# Patient Record
Sex: Female | Born: 1935 | Race: White | Hispanic: No | Smoking: Never smoker
Health system: Southern US, Community
[De-identification: ages and names within clinical notes are randomized; demographics above are authoritative.]

## PROBLEM LIST (undated history)

## (undated) DIAGNOSIS — Z8639 Personal history of other endocrine, nutritional and metabolic disease: Secondary | ICD-10-CM

## (undated) DIAGNOSIS — R519 Headache, unspecified: Secondary | ICD-10-CM

## (undated) DIAGNOSIS — I209 Angina pectoris, unspecified: Secondary | ICD-10-CM

## (undated) DIAGNOSIS — R0602 Shortness of breath: Secondary | ICD-10-CM

## (undated) DIAGNOSIS — I10 Essential (primary) hypertension: Secondary | ICD-10-CM

## (undated) DIAGNOSIS — I272 Pulmonary hypertension, unspecified: Secondary | ICD-10-CM

## (undated) DIAGNOSIS — D649 Anemia, unspecified: Secondary | ICD-10-CM

## (undated) DIAGNOSIS — K5790 Diverticulosis of intestine, part unspecified, without perforation or abscess without bleeding: Secondary | ICD-10-CM

## (undated) DIAGNOSIS — I2510 Atherosclerotic heart disease of native coronary artery without angina pectoris: Secondary | ICD-10-CM

## (undated) DIAGNOSIS — G4733 Obstructive sleep apnea (adult) (pediatric): Secondary | ICD-10-CM

## (undated) DIAGNOSIS — C801 Malignant (primary) neoplasm, unspecified: Secondary | ICD-10-CM

## (undated) DIAGNOSIS — Z8669 Personal history of other diseases of the nervous system and sense organs: Secondary | ICD-10-CM

## (undated) DIAGNOSIS — I639 Cerebral infarction, unspecified: Secondary | ICD-10-CM

## (undated) DIAGNOSIS — K219 Gastro-esophageal reflux disease without esophagitis: Secondary | ICD-10-CM

## (undated) DIAGNOSIS — G629 Polyneuropathy, unspecified: Secondary | ICD-10-CM

## (undated) DIAGNOSIS — E785 Hyperlipidemia, unspecified: Secondary | ICD-10-CM

## (undated) DIAGNOSIS — R51 Headache: Secondary | ICD-10-CM

## (undated) HISTORY — PX: VAGINAL HYSTERECTOMY: SUR661

## (undated) HISTORY — PX: CARPAL TUNNEL RELEASE: SHX101

## (undated) HISTORY — PX: CHOLECYSTECTOMY: SHX55

## (undated) HISTORY — PX: LUMBAR LAMINECTOMY: SHX95

## (undated) HISTORY — PX: BLADDER SUSPENSION: SHX72

## (undated) HISTORY — DX: Cerebral infarction, unspecified: I63.9

## (undated) HISTORY — PX: TONSILLECTOMY: SUR1361

## (undated) HISTORY — PX: APPENDECTOMY: SHX54

---

## 2001-08-18 ENCOUNTER — Encounter: Admission: RE | Admit: 2001-08-18 | Discharge: 2001-08-18 | Payer: Self-pay | Admitting: Neurosurgery

## 2001-08-18 ENCOUNTER — Encounter: Payer: Self-pay | Admitting: Neurosurgery

## 2001-09-15 ENCOUNTER — Encounter: Admission: RE | Admit: 2001-09-15 | Discharge: 2001-09-15 | Payer: Self-pay | Admitting: Neurosurgery

## 2001-09-15 ENCOUNTER — Encounter: Payer: Self-pay | Admitting: Neurosurgery

## 2001-09-29 ENCOUNTER — Encounter: Payer: Self-pay | Admitting: Neurosurgery

## 2001-09-29 ENCOUNTER — Encounter: Admission: RE | Admit: 2001-09-29 | Discharge: 2001-09-29 | Payer: Self-pay | Admitting: Neurosurgery

## 2001-12-30 ENCOUNTER — Encounter: Payer: Self-pay | Admitting: Neurosurgery

## 2002-01-04 ENCOUNTER — Inpatient Hospital Stay (HOSPITAL_COMMUNITY): Admission: RE | Admit: 2002-01-04 | Discharge: 2002-01-06 | Payer: Self-pay | Admitting: Neurosurgery

## 2002-01-04 ENCOUNTER — Encounter: Payer: Self-pay | Admitting: Neurosurgery

## 2002-12-21 ENCOUNTER — Encounter: Payer: Self-pay | Admitting: Neurosurgery

## 2002-12-21 ENCOUNTER — Encounter: Admission: RE | Admit: 2002-12-21 | Discharge: 2002-12-21 | Payer: Self-pay | Admitting: Neurosurgery

## 2002-12-21 ENCOUNTER — Encounter: Payer: Self-pay | Admitting: Radiology

## 2003-01-13 ENCOUNTER — Encounter: Payer: Self-pay | Admitting: Neurosurgery

## 2003-01-13 ENCOUNTER — Encounter: Admission: RE | Admit: 2003-01-13 | Discharge: 2003-01-13 | Payer: Self-pay | Admitting: Neurosurgery

## 2004-11-28 ENCOUNTER — Ambulatory Visit: Payer: Self-pay | Admitting: Internal Medicine

## 2005-10-07 ENCOUNTER — Ambulatory Visit: Payer: Self-pay | Admitting: Internal Medicine

## 2005-12-02 ENCOUNTER — Ambulatory Visit: Payer: Self-pay | Admitting: Internal Medicine

## 2006-05-13 ENCOUNTER — Other Ambulatory Visit: Payer: Self-pay

## 2006-05-13 ENCOUNTER — Ambulatory Visit: Payer: Self-pay | Admitting: Unknown Physician Specialty

## 2006-05-26 ENCOUNTER — Ambulatory Visit: Payer: Self-pay | Admitting: Unknown Physician Specialty

## 2006-12-24 ENCOUNTER — Ambulatory Visit: Payer: Self-pay | Admitting: Internal Medicine

## 2008-02-03 ENCOUNTER — Ambulatory Visit: Payer: Self-pay | Admitting: Internal Medicine

## 2008-02-17 ENCOUNTER — Ambulatory Visit: Payer: Self-pay | Admitting: Internal Medicine

## 2008-07-28 HISTORY — PX: CARDIAC CATHETERIZATION: SHX172

## 2008-09-26 ENCOUNTER — Ambulatory Visit: Payer: Self-pay | Admitting: Internal Medicine

## 2008-09-29 ENCOUNTER — Ambulatory Visit: Payer: Self-pay | Admitting: Internal Medicine

## 2008-10-02 ENCOUNTER — Ambulatory Visit: Payer: Self-pay | Admitting: Internal Medicine

## 2008-10-03 ENCOUNTER — Inpatient Hospital Stay: Payer: Self-pay | Admitting: Internal Medicine

## 2008-11-02 ENCOUNTER — Encounter: Payer: Self-pay | Admitting: Internal Medicine

## 2008-11-25 ENCOUNTER — Encounter: Payer: Self-pay | Admitting: Internal Medicine

## 2008-12-26 ENCOUNTER — Encounter: Payer: Self-pay | Admitting: Internal Medicine

## 2009-01-25 ENCOUNTER — Encounter: Payer: Self-pay | Admitting: Internal Medicine

## 2009-11-01 ENCOUNTER — Inpatient Hospital Stay: Payer: Self-pay | Admitting: Internal Medicine

## 2011-04-29 ENCOUNTER — Ambulatory Visit: Payer: Self-pay | Admitting: Specialist

## 2011-05-01 ENCOUNTER — Ambulatory Visit: Payer: Self-pay | Admitting: Internal Medicine

## 2015-04-04 ENCOUNTER — Other Ambulatory Visit: Payer: Self-pay | Admitting: Internal Medicine

## 2015-04-04 DIAGNOSIS — M79605 Pain in left leg: Secondary | ICD-10-CM

## 2015-04-06 ENCOUNTER — Ambulatory Visit
Admission: RE | Admit: 2015-04-06 | Discharge: 2015-04-06 | Disposition: A | Discharge status: Home / Self Care | Payer: Medicare Other | Source: Ambulatory Visit | Attending: Internal Medicine | Admitting: Internal Medicine

## 2015-04-06 DIAGNOSIS — M79605 Pain in left leg: Secondary | ICD-10-CM | POA: Insufficient documentation

## 2015-04-06 DIAGNOSIS — M7122 Synovial cyst of popliteal space [Baker], left knee: Secondary | ICD-10-CM | POA: Insufficient documentation

## 2015-06-27 ENCOUNTER — Emergency Department
Admission: EM | Admit: 2015-06-27 | Discharge: 2015-06-27 | Disposition: A | Discharge status: Home / Self Care | Payer: Medicare Other | Attending: Emergency Medicine | Admitting: Emergency Medicine

## 2015-06-27 ENCOUNTER — Emergency Department: Payer: Medicare Other

## 2015-06-27 DIAGNOSIS — R079 Chest pain, unspecified: Secondary | ICD-10-CM | POA: Diagnosis present

## 2015-06-27 DIAGNOSIS — R0602 Shortness of breath: Secondary | ICD-10-CM | POA: Insufficient documentation

## 2015-06-27 DIAGNOSIS — R062 Wheezing: Secondary | ICD-10-CM | POA: Diagnosis not present

## 2015-06-27 DIAGNOSIS — R0600 Dyspnea, unspecified: Secondary | ICD-10-CM | POA: Insufficient documentation

## 2015-06-27 DIAGNOSIS — I10 Essential (primary) hypertension: Secondary | ICD-10-CM | POA: Insufficient documentation

## 2015-06-27 DIAGNOSIS — E860 Dehydration: Secondary | ICD-10-CM

## 2015-06-27 DIAGNOSIS — R7989 Other specified abnormal findings of blood chemistry: Secondary | ICD-10-CM

## 2015-06-27 DIAGNOSIS — R778 Other specified abnormalities of plasma proteins: Secondary | ICD-10-CM

## 2015-06-27 HISTORY — DX: Headache, unspecified: R51.9

## 2015-06-27 HISTORY — DX: Obstructive sleep apnea (adult) (pediatric): G47.33

## 2015-06-27 HISTORY — DX: Gastro-esophageal reflux disease without esophagitis: K21.9

## 2015-06-27 HISTORY — DX: Hyperlipidemia, unspecified: E78.5

## 2015-06-27 HISTORY — DX: Anemia, unspecified: D64.9

## 2015-06-27 HISTORY — DX: Essential (primary) hypertension: I10

## 2015-06-27 HISTORY — DX: Headache: R51

## 2015-06-27 HISTORY — DX: Shortness of breath: R06.02

## 2015-06-27 HISTORY — DX: Angina pectoris, unspecified: I20.9

## 2015-06-27 HISTORY — DX: Atherosclerotic heart disease of native coronary artery without angina pectoris: I25.10

## 2015-06-27 LAB — BASIC METABOLIC PANEL
Anion gap: 7 (ref 5–15)
BUN: 17 mg/dL (ref 6–20)
CHLORIDE: 97 mmol/L — AB (ref 101–111)
CO2: 24 mmol/L (ref 22–32)
CREATININE: 1.09 mg/dL — AB (ref 0.44–1.00)
Calcium: 8.7 mg/dL — ABNORMAL LOW (ref 8.9–10.3)
GFR calc Af Amer: 48 mL/min — ABNORMAL LOW (ref >60)
GFR calc non Af Amer: 42 mL/min — ABNORMAL LOW (ref >60)
GLUCOSE: 122 mg/dL — AB (ref 65–99)
POTASSIUM: 4.7 mmol/L (ref 3.5–5.1)
Sodium: 128 mmol/L — ABNORMAL LOW (ref 135–145)

## 2015-06-27 LAB — TROPONIN I
Troponin I: 0.27 ng/mL — ABNORMAL HIGH (ref <0.031)
Troponin I: <0.03 ng/mL (ref <0.031)

## 2015-06-27 MED ORDER — ALBUTEROL SULFATE (2.5 MG/3ML) 0.083% IN NEBU
2.5000 mg | INHALATION_SOLUTION | Freq: Once | RESPIRATORY_TRACT | Status: AC
  Administered 2015-06-27: 2.5 mg via RESPIRATORY_TRACT

## 2015-06-27 MED ORDER — PREDNISONE 20 MG PO TABS
20.0000 mg | ORAL_TABLET | Freq: Once | ORAL | Status: AC
  Administered 2015-06-27: 20 mg via ORAL
  Filled 2015-06-27: qty 1

## 2015-06-27 MED ORDER — SODIUM CHLORIDE 0.9 % IV BOLUS (SEPSIS)
500.0000 mL | Freq: Once | INTRAVENOUS | Status: AC
  Administered 2015-06-27: 500 mL via INTRAVENOUS

## 2015-06-27 MED ORDER — PREDNISONE 20 MG PO TABS: 20.0000 mg | ORAL_TABLET | Freq: Every day | ORAL | Status: DC

## 2015-06-27 MED ORDER — ALBUTEROL SULFATE HFA 108 (90 BASE) MCG/ACT IN AERS: 2.0000 | INHALATION_SPRAY | RESPIRATORY_TRACT | Status: DC | PRN

## 2015-06-27 NOTE — Discharge Instructions (Signed)
Dehydration  Dehydration is when you lose more fluids from the body than you take in. Vital organs such as the kidneys, brain, and heart cannot function without a proper amount of fluids and salt. Any loss of fluids from the body can cause dehydration.   Older adults are at a higher risk of dehydration than younger adults. As we age, our bodies are less able to conserve water and do not respond to temperature changes as well. Also, older adults do not become thirsty as easily or quickly. Because of this, older adults often do not realize they need to increase fluids to avoid dehydration.   CAUSES    Vomiting.   Diarrhea.   Excessive sweating.   Excessive urination.   Fever.   Certain medicines, such as blood pressure medicines called diuretics.   Poorly controlled blood sugars.  SIGNS AND SYMPTOMS   Mild dehydration:   Thirst.   Dry lips.   Slightly dry mouth.  Moderate dehydration:   Very dry mouth.   Sunken eyes.   Skin does not bounce back quickly when lightly pinched and released.   Dark urine and decreased urine production.   Decreased tear production.   Headache.  Severe dehydration:   Very dry mouth.   Extreme thirst.   Rapid, weak pulse (more than 100 beats per minute at rest).   Cold hands and feet.   Not able to sweat in spite of heat.   Rapid breathing.   Blue lips.   Confusion and lethargy.   Difficulty being awakened.   Minimal urine production.   No tears.  DIAGNOSIS   Your health care provider will diagnose dehydration based on your symptoms and your exam. Blood and urine tests will help confirm the diagnosis. The diagnostic evaluation should also identify the cause of dehydration.  TREATMENT   Treatment of mild or moderate dehydration can often be done at home by increasing the amount of fluids that you drink. It is best to drink small amounts of fluid more often. Drinking too much at one time can make vomiting worse. Severe dehydration needs to be treated at the hospital.  You may be given IV fluids that contain water and electrolytes.  HOME CARE INSTRUCTIONS    Ask your health care provider about specific rehydration instructions.   Drink enough fluids to keep your urine clear or pale yellow.   Drink small amounts frequently if you have nausea and vomiting.   Eat as you normally do.   Avoid:    Foods or drinks high in sugar.    Carbonated drinks.    Juice.    Extremely hot or cold fluids.    Drinks with caffeine.    Fatty, greasy foods.    Alcohol.    Tobacco.    Overeating.    Gelatin desserts.   Wash your hands well to avoid spreading bacteria and viruses.   Only take over-the-counter or prescription medicines for pain, discomfort, or fever as directed by your health care provider.   Ask your health care provider if you should continue all prescribed and over-the-counter medicines.   Keep all follow-up appointments with your health care provider.  SEEK MEDICAL CARE IF:   You have abdominal pain, and it increases or stays in one area (localizes).   You have a rash, stiff neck, or severe headache.   You are irritable, sleepy, or difficult to awaken.   You are weak, dizzy, or extremely thirsty.   You have a fever.    SEEK IMMEDIATE MEDICAL CARE IF:    You are unable to keep fluids down, or you get worse despite treatment.   You have frequent episodes of vomiting or diarrhea.   You have blood or green matter (bile) in your vomit.   You have blood in your stool, or your stool looks black and tarry.   You have not urinated in 6-8 hours, or you have only urinated a small amount of very dark urine.   You faint.  MAKE SURE YOU:    Understand these instructions.   Will watch your condition.   Will get help right away if you are not doing well or get worse.     This information is not intended to replace advice given to you by your health care provider. Make sure you discuss any questions you have with your health care provider.     Document Released: 10/04/2003 Document  Revised: 07/19/2013 Document Reviewed: 03/21/2013  [employer]vier Interactive Patient Education 2016 [employer]

## 2015-06-27 NOTE — ED Notes (Signed)
Pt in NAD, eager to go home.  Daughters with pt.  Discharge teaching done with pt and daughters, voiced understanding.  No questions or concerns at this time.  Items with pt upon discharge.  No items left in ED.

## 2015-06-27 NOTE — ED Provider Notes (Addendum)
[facility] Emergency Department Provider Note  ____________________________________________  Time seen: 6:20 PM  I have reviewed the triage vital signs and the nursing notes.   HISTORY  Chief Complaint Chest Pain    HPI Sara Robertson is a 81 y.o. female who sent to the ED by her primary care doctor, Dr. Doy Hutching for evaluation of chest discomfort with shortness of breath.  The patient's daughters at the bedside do report that she wears an oxygen nasal cannula at night while sleeping. They recently checked and found that the air filter to the equipment was actually very dirty and even moldy. Additionally, the nasal cannula itself was a very dirty. They have arranged for replacement parts, but the new air filters won't arrive until tomorrow. With this, the patient is having wheezing every day, worse in the morning when she wakes up after using the oxygen all night. Also when she walks around in addition to feeling short of breath she also has some vague chest pressure that gets better when she stops walking and sits down and only lasts for a few minutes at a time.. Denies any radiation diaphoresis vomiting. No dizziness or syncope. Denies a history of angina or congestive heart failure.Her cardiologist is Dr. Clayborn Bigness.  Additionally, the patient does report that the shortness of breath has been going on for at least one month.      Past Medical History  Diagnosis Date  . Hypertension   . Hyperlipemia   . CAD (coronary artery disease)   . Angina pectoris ([place])   . SOB (shortness of breath)   . GERD (gastroesophageal reflux disease)   . OSA (obstructive sleep apnea)   . Headache   . Anemia      There are no active problems to display for this patient.    History reviewed. No pertinent past surgical history.   No current outpatient prescriptions on file. Current medication list in Epic care everywhere reviewed.  Allergies Celebrex; Motrin; and  Nexium   History reviewed. No pertinent family history.  Social History Social History  Substance Use Topics  . Smoking status: Never Smoker   . Smokeless tobacco: None  . Alcohol Use: No    Review of Systems  Constitutional:   No fever or chills. No weight changes Eyes:   No blurry vision or double vision.  ENT:   No sore throat. Cardiovascular:   Positive chest pain. Respiratory:   Positive dyspnea without cough. Gastrointestinal:   Negative for abdominal pain, vomiting and diarrhea.  No BRBPR or melena. Genitourinary:   Negative for dysuria, urinary retention, bloody urine, or difficulty urinating. Musculoskeletal:   Negative for back pain. No joint swelling or pain. Skin:   Negative for rash. Neurological:   Negative for headaches, focal weakness or numbness. Psychiatric:  No anxiety or depression.   Endocrine:  No hot/cold intolerance, changes in energy, or sleep difficulty.  10-point ROS otherwise negative.  ____________________________________________   PHYSICAL EXAM:  VITAL SIGNS: ED Triage Vitals  Enc Vitals Group     BP 06/27/15 1630 107/63 mmHg     Pulse Rate 06/27/15 1630 59     Resp 06/27/15 1630 16     Temp 06/27/15 1630 98.3 F (36.8 C)     Temp Source 06/27/15 1630 Oral     SpO2 06/27/15 1630 98 %     Weight 06/27/15 1630 187 lb (84.823 kg)     Height 06/27/15 1630 4\' 11"  (1.499 m)  Head Cir --      Peak Flow --      Pain Score --      Pain Loc --      Pain Edu? --      Excl. in [place]? --      Constitutional:   Alert and oriented. Well appearing and in no distress. Eyes:   No scleral icterus. No conjunctival pallor. PERRL. EOMI ENT   Head:   Normocephalic and atraumatic.   Nose:   No congestion/rhinnorhea. No septal hematoma   Mouth/Throat:   MMM, no pharyngeal erythema. No peritonsillar mass. No uvula shift.   Neck:   No stridor. No SubQ emphysema. No meningismus. Hematological/Lymphatic/Immunilogical:   No cervical  lymphadenopathy. Cardiovascular:   RRR. Normal and symmetric distal pulses are present in all extremities. No murmurs, rubs, or gallops. Respiratory:   Normal respiratory effort without tachypnea nor retractions. Diffuse expiratory wheezing without focal consolidation or findings  Gastrointestinal:   Soft and nontender. No distention. There is no CVA tenderness.  No rebound, rigidity, or guarding. Genitourinary:   deferred Musculoskeletal:   Nontender with normal range of motion in all extremities. No joint effusions.  No lower extremity tenderness.  No edema. Neurologic:   Normal speech and language.  CN 2-10 normal. Motor grossly intact. No pronator drift.  Normal gait. No gross focal neurologic deficits are appreciated.  Skin:    Skin is warm, dry and intact. No rash noted.  No petechiae, purpura, or bullae. Psychiatric:   Mood and affect are normal. Speech and behavior are normal. Patient exhibits appropriate insight and judgment.  ____________________________________________    LABS (pertinent positives/negatives) (all labs ordered are listed, but only abnormal results are displayed) Labs Reviewed  BASIC METABOLIC PANEL - Abnormal; Notable for the following:    Sodium 128 (*)    Chloride 97 (*)    Glucose, Bld 122 (*)    Creatinine, Ser 1.09 (*)    Calcium 8.7 (*)    GFR calc non Af Amer 42 (*)    GFR calc Af Amer 48 (*)    All other components within normal limits  TROPONIN I - Abnormal; Notable for the following:    Troponin I 0.27 (*)    All other components within normal limits  CBC WITH DIFFERENTIAL/PLATELET  TROPONIN I   ____________________________________________   EKG  Interpreted by me Normal sinus rhythm rate of 60, right axis, normal intervals. Poor R-wave progression in anterior precordial leads, normal ST segments. Isolated T-wave inversion in lead 3 which is nonspecific.  ____________________________________________    RADIOLOGY  Chest x-ray  unremarkable. Stable bilateral small pleural effusions  ____________________________________________   PROCEDURES   ____________________________________________   INITIAL IMPRESSION / ASSESSMENT AND PLAN / ED COURSE  Pertinent labs & imaging results that were available during my care of the patient were reviewed by me and considered in my medical decision making (see chart for details).  Patient presents with exertional chest pressure in a 81 year old lady with known CAD and comorbidities. There is a very clear history that strongly suggest that this is related to some irritant exposure from contaminated oxygen equipment that she wears at night. She is very well-appearing no acute distress comfortable without acute EKG changes. Given this, we will check a troponin but if it is unremarkable at think the patient can be safely discharged home to follow up with primary care and cardiology. We'll give her prednisone and it breathing treatment here for the wheezing.  ----------------------------------------- 9:21  PM on 06/27/2015 -----------------------------------------  Troponin came back at 0.27. There are no prior results in our system or in Epic care everywhere to compare to. Had a long discussion with the patient and family regarding the elevated troponin and need to monitor the patient for serial troponins and cardiac evaluation. The patient and family are strongly opposed to hospitalization if at all possible. She could follow up with her cardiologist tomorrow.  Given this, and the fact that her symptoms seem very minor and our result and that this is very likely related to a pulmonary issue, we will recheck a troponin. If it is higher than 0.27 the patient agrees to stay in the hospital, but if it is lower, she wishes to go home. We'll give her aspirin for now, but we will hold off on anticoagulation until we know that the patient will be admitted and monitored.,. I have low suspicion for PE  dissection carditis or CHF. The patient and family do understand that going home is [place] and that we do recommend admission for further monitoring of her elevated troponin.  The patient signed out to Dr. Baker Janus to follow-up on the repeat troponin for disposition.    ____________________________________________   FINAL CLINICAL IMPRESSION(S) / ED DIAGNOSES  Final diagnoses:  Mild dehydration  Elevated troponin      Carrie Mew, MD 06/27/15 2124  Carrie Mew, MD 06/27/15 2125

## 2015-06-27 NOTE — ED Notes (Signed)
Dr. Stafford notified of elevated troponin. 

## 2015-06-27 NOTE — ED Notes (Signed)
Patient was seen at Dr. Doy Hutching office today and complained of chest discomfort with shortness of breath and Dr. Doy Hutching reccommended patient be seen in the ER.

## 2015-06-27 NOTE — ED Provider Notes (Signed)
-----------------------------------------   11:13 PM on 06/27/2015 ----------------------------------------- Care was assumed Dr. Joni Fears at 10 PM pending repeat troponin. Repeat troponin less than 0.03 and has completely normalized. Patient reports she feels well and is requesting discharge. We discussed return precautions, need for close follow-up with her cardiologist tomorrow. The patient and her family at bedside are comfortable with the discharge plan.   Joanne Gavel, MD 06/27/15 [id number]

## 2015-06-29 ENCOUNTER — Inpatient Hospital Stay: Payer: Medicare Other

## 2015-06-29 ENCOUNTER — Other Ambulatory Visit: Payer: Self-pay

## 2015-06-29 ENCOUNTER — Encounter: Payer: Self-pay | Admitting: Hematology and Oncology

## 2015-06-29 ENCOUNTER — Inpatient Hospital Stay: Payer: Medicare Other | Attending: Hematology and Oncology | Admitting: Hematology and Oncology

## 2015-06-29 VITALS — BP 176/62 | HR 64 | Temp 96.3°F | Resp 20

## 2015-06-29 VITALS — BP 137/74 | HR 63 | Temp 95.5°F | Resp 18 | Ht 59.0 in | Wt 185.2 lb

## 2015-06-29 DIAGNOSIS — Z79899 Other long term (current) drug therapy: Secondary | ICD-10-CM | POA: Insufficient documentation

## 2015-06-29 DIAGNOSIS — D649 Anemia, unspecified: Secondary | ICD-10-CM

## 2015-06-29 DIAGNOSIS — Z87440 Personal history of urinary (tract) infections: Secondary | ICD-10-CM | POA: Diagnosis not present

## 2015-06-29 DIAGNOSIS — D509 Iron deficiency anemia, unspecified: Secondary | ICD-10-CM | POA: Diagnosis not present

## 2015-06-29 DIAGNOSIS — K582 Mixed irritable bowel syndrome: Secondary | ICD-10-CM

## 2015-06-29 DIAGNOSIS — R1310 Dysphagia, unspecified: Secondary | ICD-10-CM | POA: Diagnosis not present

## 2015-06-29 DIAGNOSIS — Z9981 Dependence on supplemental oxygen: Secondary | ICD-10-CM | POA: Diagnosis not present

## 2015-06-29 DIAGNOSIS — I2510 Atherosclerotic heart disease of native coronary artery without angina pectoris: Secondary | ICD-10-CM | POA: Diagnosis not present

## 2015-06-29 DIAGNOSIS — J90 Pleural effusion, not elsewhere classified: Secondary | ICD-10-CM | POA: Insufficient documentation

## 2015-06-29 DIAGNOSIS — K580 Irritable bowel syndrome with diarrhea: Secondary | ICD-10-CM | POA: Diagnosis not present

## 2015-06-29 DIAGNOSIS — R0602 Shortness of breath: Secondary | ICD-10-CM | POA: Insufficient documentation

## 2015-06-29 DIAGNOSIS — K219 Gastro-esophageal reflux disease without esophagitis: Secondary | ICD-10-CM | POA: Insufficient documentation

## 2015-06-29 DIAGNOSIS — Z7982 Long term (current) use of aspirin: Secondary | ICD-10-CM | POA: Diagnosis not present

## 2015-06-29 DIAGNOSIS — K581 Irritable bowel syndrome with constipation: Secondary | ICD-10-CM | POA: Diagnosis not present

## 2015-06-29 DIAGNOSIS — E785 Hyperlipidemia, unspecified: Secondary | ICD-10-CM | POA: Diagnosis not present

## 2015-06-29 DIAGNOSIS — R5383 Other fatigue: Secondary | ICD-10-CM | POA: Diagnosis not present

## 2015-06-29 DIAGNOSIS — I10 Essential (primary) hypertension: Secondary | ICD-10-CM | POA: Diagnosis not present

## 2015-06-29 DIAGNOSIS — Z7952 Long term (current) use of systemic steroids: Secondary | ICD-10-CM | POA: Diagnosis not present

## 2015-06-29 DIAGNOSIS — G4733 Obstructive sleep apnea (adult) (pediatric): Secondary | ICD-10-CM | POA: Diagnosis not present

## 2015-06-29 DIAGNOSIS — R0789 Other chest pain: Secondary | ICD-10-CM | POA: Diagnosis not present

## 2015-06-29 LAB — CBC WITH DIFFERENTIAL/PLATELET
Basophils Absolute: 0 10*3/uL (ref 0–0.1)
Basophils Relative: 0 %
Eosinophils Absolute: 0 10*3/uL (ref 0–0.7)
Eosinophils Relative: 0 %
HCT: 23.8 % — ABNORMAL LOW (ref 35.0–47.0)
Hemoglobin: 7.7 g/dL — ABNORMAL LOW (ref 12.0–16.0)
Lymphocytes Relative: 6 %
Lymphs Abs: 0.5 10*3/uL — ABNORMAL LOW (ref 1.0–3.6)
MCH: 23.1 pg — ABNORMAL LOW (ref 26.0–34.0)
MCHC: 32.1 g/dL (ref 32.0–36.0)
MCV: 71.9 fL — ABNORMAL LOW (ref 80.0–100.0)
Monocytes Absolute: 0.6 10*3/uL (ref 0.2–0.9)
Monocytes Relative: 8 %
Neutro Abs: 6.4 10*3/uL (ref 1.4–6.5)
Neutrophils Relative %: 86 %
Platelets: 297 10*3/uL (ref 150–440)
RBC: 3.31 MIL/uL — ABNORMAL LOW (ref 3.80–5.20)
RDW: 17.7 % — ABNORMAL HIGH (ref 11.5–14.5)
WBC: 7.5 10*3/uL (ref 3.6–11.0)

## 2015-06-29 LAB — IRON AND TIBC
Iron: 19 ug/dL — ABNORMAL LOW (ref 28–170)
Saturation Ratios: 4 % — ABNORMAL LOW (ref 10.4–31.8)
TIBC: 471 ug/dL — ABNORMAL HIGH (ref 250–450)
UIBC: 452 ug/dL

## 2015-06-29 LAB — VITAMIN B12: Vitamin B-12: 286 pg/mL (ref 180–914)

## 2015-06-29 LAB — FOLATE: Folate: 15.8 ng/mL (ref >5.9)

## 2015-06-29 LAB — PREPARE RBC (CROSSMATCH)

## 2015-06-29 LAB — ABO/RH: ABO/RH(D): A POS

## 2015-06-29 LAB — FERRITIN: Ferritin: 8 ng/mL — ABNORMAL LOW (ref 11–307)

## 2015-06-29 MED ORDER — DIPHENHYDRAMINE HCL 25 MG PO CAPS
25.0000 mg | ORAL_CAPSULE | Freq: Once | ORAL | Status: AC
  Administered 2015-06-29: 25 mg via ORAL
  Filled 2015-06-29: qty 1

## 2015-06-29 MED ORDER — SODIUM CHLORIDE 0.9 % IV SOLN
250.0000 mL | Freq: Once | INTRAVENOUS | Status: AC
  Administered 2015-06-29: 250 mL via INTRAVENOUS
  Filled 2015-06-29: qty 250

## 2015-06-29 MED ORDER — ACETAMINOPHEN 325 MG PO TABS
650.0000 mg | ORAL_TABLET | Freq: Once | ORAL | Status: AC
  Administered 2015-06-29: 650 mg via ORAL
  Filled 2015-06-29: qty 2

## 2015-06-29 NOTE — Progress Notes (Signed)
Patient is referred here by Dr. Doy Hutching for anemia (Hgb 7.2). Patient has been having increased fatigue over the past several days. She has also been having SOB with any exertion. She was sent to ER on Wednesday by Dr. Doy Hutching because of low blood counts and chest pain.  Patient was not given a blood transfusion but was prescribed albuterol and prednisone.

## 2015-06-29 NOTE — Progress Notes (Signed)
[facility] day:  06/29/2015  Chief Complaint: Sara Robertson is a 81 y.o. female  with anemia who is referred in consultation by Dr. Doy Hutching for assessment and management.  HPI: The patient has gradually developed anemia over the past year.  She states that at some point in the past, she was on B12 shots. She has not had a B12 injection in possibly 2 years.  She describes a history of irritable bowel for at least a year. She notes hard stools and explosive diarrhea. She has had some difficulty cleaning herself and has had recurrent UTIs.  She had an Escherichia coli UTI documented on 06/07/2015. Follow-up labs on 06/27/2015 revealed 10-50 white blood cells and moderate bacteria. She states that she was placed on "double antibiotics".  The patient denies any melena or hematochezia. Last colonoscopy was greater than 10 years ago. She has never had an EGD. Stools were guaiac negative on 07/07/2014.  She notes that her diet is "not healthy". She states that she loves salt, butter, and sweets. She may eat meat 2-3 times a week. She eats vegetables and fruit.  She states that she saw her primary care physician on 06/27/2015.  She felt uncomfortable, had some shortness of breath, and chest pressure.  Labs returned with a hematocrit of 24.1, hemoglobin 7.2, and MCV 76. She was referred to the emergency room.  ER notes reveal that she wore oxygen at night for sleep apnea. Her home nasal cannula was dirty and needed cleaning.  Chest x-ray revealed small bilateral pleural effusions and no pulmonary edema or focal pneumonia. As her symptoms were progressive over a month and her troponin decreased from 0.27 to less than 0.03, she was discharged on albuterol and prednisone.  She was seen by her cardiologist, Dr. Lujean Amel, on 06/28/2015.  Labs from 03/09/2014 revealed a hematocrit 34.3, hemoglobin 11.3, MCV 90.5, platelets 246,000 and white count 8,700. Labs on  12/07/2014 revealed a hematocrit of 28.8, hemoglobin 9, and MCV 81.4. CBC on 03/23/2015 revealed a hematocrit 29.6, hemoglobin 9.1, and MCV 79.4. Labs on 06/27/2015 revealed a hematocrit of 24.1, hemoglobin 7.2, MCV 76, platelets 264,000, and white count 5800.   Additional labs on 06/27/2015 revealed a creatinine of 1.0 (CrCl 52 ml/minute), albumin 3.2 and protein 6.6. Monocyte count has ranged between 14.8 and 15.6% (absolute monocyte count [phone]).  B12 was 260 (low) on 07/07/2014 and 347 on 12/06/2004.  Folate was 22.3.    Past Medical History  Diagnosis Date  . Hypertension   . Hyperlipemia   . CAD (coronary artery disease)   . Angina pectoris ([place])   . SOB (shortness of breath)   . GERD (gastroesophageal reflux disease)   . OSA (obstructive sleep apnea)   . Headache   . Anemia     No past surgical history on file.  Family History  Problem Relation Age of Onset  . Cancer Mother   . Cancer Sister   . Cancer Brother     Social History:  reports that she has never smoked. She does not have any smokeless tobacco history on file. She reports that she does not drink alcohol. Her drug history is not on file.  The patient is accompanied by her daughter, Sara Robertson, today.  Allergies:  Allergies  Allergen Reactions  . Celebrex [Celecoxib] Nausea And Vomiting  . Motrin [Ibuprofen] Nausea And Vomiting  . Nexium [Esomeprazole Magnesium] Nausea And Vomiting    Current Medications: Current Outpatient  Prescriptions  Medication Sig Dispense Refill  . albuterol (PROVENTIL HFA) 108 (90 BASE) MCG/ACT inhaler Inhale 2 puffs into the lungs every 4 (four) hours as needed for wheezing or shortness of breath. 1 Inhaler 0  . aspirin EC 81 MG tablet Take 81 mg by mouth daily.    . bisoprolol-hydrochlorothiazide (ZIAC) 5-6.25 MG tablet Take 1 tablet by mouth daily.    . clopidogrel (PLAVIX) 75 MG tablet Take 75 mg by mouth daily.    Marland Kitchen losartan (COZAAR) 50 MG tablet Take 50 mg by mouth daily.     . Multiple Vitamin (MULTIVITAMIN WITH MINERALS) TABS tablet Take 1 tablet by mouth daily.    . Multiple Vitamins-Minerals (PRESERVISION AREDS 2) CAPS Take 2 capsules by mouth daily.    . nitrofurantoin (MACRODANTIN) 50 MG capsule Take 50 mg by mouth daily.    Marland Kitchen omeprazole (PRILOSEC) 40 MG capsule Take 40 mg by mouth daily.    . pravastatin (PRAVACHOL) 40 MG tablet Take 40 mg by mouth daily.    . predniSONE (DELTASONE) 20 MG tablet Take 1 tablet (20 mg total) by mouth daily. 3 tablet 0   No current facility-administered medications for this visit.    Review of Systems:  GENERAL:  Fatigue.  Active.  No fevers, sweats or weight loss. PERFORMANCE STATUS (ECOG):  1-2 HEENT:  No visual changes, runny nose, sore throat, mouth sores or tenderness. Lungs: No shortness of breath or cough.  No hemoptysis. Cardiac:  No chest pain, palpitations, orthopnea, or PND. GI:  Reflux.  Difficulty with dry foods.  Irritable bowel.  Hard stools and explosive diarrhea.  No nausea, vomiting,melena or hematochezia. GU:  Recurrent UTIs.  No urgency, frequency, dysuria, or hematuria. Musculoskeletal:  No back pain.  No joint pain.  No muscle tenderness. Extremities:  No pain or swelling. Skin:  No rashes or skin changes. Neuro:  No headache, numbness or weakness, balance or coordination issues. Endocrine:  No diabetes, thyroid issues, hot flashes or night sweats. Psych:  No mood changes, depression or anxiety. Pain:  No focal pain. Review of systems:  All other systems reviewed and found to be negative.  Physical Exam: Blood pressure 137/74, pulse 63, temperature 95.5 F (35.3 C), temperature source Tympanic, resp. rate 18, height $RemoveBe'4\' 11"'[id number]$  (1.499 m), weight 185 lb 3 oz (84 kg), SpO2 99 %. GENERAL:  Well developed, well nourished, sitting comfortably in a wheelchair in the exam room in no acute distress. MENTAL STATUS:  Alert and oriented to person, place and time. HEAD:  Pearline Cables hair.  Normocephalic, atraumatic,  face symmetric, no Cushingoid features. EYES:  Glasses.  Brown/hazel eyes.  Pupils equal round and reactive to light and accomodation.  No conjunctivitis or scleral icterus. ENT:  Oropharynx clear without lesion.  Tongue normal. Mucous membranes moist.  RESPIRATORY:  Clear to auscultation without rales, wheezes or rhonchi. CARDIOVASCULAR:  Regular rate and rhythm without murmur, rub or gallop. ABDOMEN:  Soft, non-tender, with active bowel sounds, and no hepatosplenomegaly.  No masses. SKIN:  Pale.  Ecchymosis dorsum left hand.  No rashes, ulcers or lesions. EXTREMITIES: No edema, no skin discoloration or tenderness.  No palpable cords. LYMPH NODES: No palpable cervical, supraclavicular, axillary or inguinal adenopathy  NEUROLOGICAL: Unremarkable. PSYCH:  Appropriate.  Orders Only on 06/29/2015  Component Date Value Ref Range Status  . Order Confirmation 06/29/2015 ORDER PROCESSED BY BLOOD BANK   Final  . ABO/RH(D) 06/29/2015 A POS   Final  Office Visit on 06/29/2015  Component Date Value  Ref Range Status  . ABO/RH(D) 06/29/2015 A POS   Final  . Antibody Screen 06/29/2015 NEG   Final  . Sample Expiration 06/29/2015 07/02/2015   Final  . Unit Number 06/29/2015 [id number]   Final  . Blood Component Type 06/29/2015 RBC, LR IRR   Final  . Unit division 06/29/2015 00   Final  . Status of Unit 06/29/2015 ISSUED,FINAL   Final  . Transfusion Status 06/29/2015 OK TO TRANSFUSE   Final  . Crossmatch Result 06/29/2015 Compatible   Final  . Unit Number 06/29/2015 [id number]   Final  . Blood Component Type 06/29/2015 RED CELLS,LR   Final  . Unit division 06/29/2015 00   Final  . Status of Unit 06/29/2015 ALLOCATED   Final  . Transfusion Status 06/29/2015 OK TO TRANSFUSE   Final  . Crossmatch Result 06/29/2015 Compatible   Final  . WBC 06/29/2015 7.5  3.6 - 11.0 K/uL Final  . RBC 06/29/2015 3.31* 3.80 - 5.20 MIL/uL Final  . Hemoglobin 06/29/2015 7.7* 12.0 - 16.0 g/dL Final  . HCT 06/29/2015  23.8* 35.0 - 47.0 % Final  . MCV 06/29/2015 71.9* 80.0 - 100.0 fL Final  . MCH 06/29/2015 23.1* 26.0 - 34.0 pg Final  . MCHC 06/29/2015 32.1  32.0 - 36.0 g/dL Final  . RDW 06/29/2015 17.7* 11.5 - 14.5 % Final  . Platelets 06/29/2015 297  150 - 440 K/uL Final  . Neutrophils Relative % 06/29/2015 86   Final  . Neutro Abs 06/29/2015 6.4  1.4 - 6.5 K/uL Final  . Lymphocytes Relative 06/29/2015 6   Final  . Lymphs Abs 06/29/2015 0.5* 1.0 - 3.6 K/uL Final  . Monocytes Relative 06/29/2015 8   Final  . Monocytes Absolute 06/29/2015 0.6  0.2 - 0.9 K/uL Final  . Eosinophils Relative 06/29/2015 0   Final  . Eosinophils Absolute 06/29/2015 0.0  0 - 0.7 K/uL Final  . Basophils Relative 06/29/2015 0   Final  . Basophils Absolute 06/29/2015 0.0  0 - 0.1 K/uL Final  . Ferritin 06/29/2015 8* 11 - 307 ng/mL Final  . Iron 06/29/2015 19* 28 - 170 ug/dL Final  . TIBC 06/29/2015 471* 250 - 450 ug/dL Final  . Saturation Ratios 06/29/2015 4* 10.4 - 31.8 % Final  . UIBC 06/29/2015 452   Final  . Vitamin B-12 06/29/2015 286  180 - 914 pg/mL Final   Comment: (NOTE) This assay is not validated for testing neonatal or myeloproliferative syndrome specimens for Vitamin B12 levels. Performed at [facility]   . Folate 06/29/2015 15.8  >5.9 ng/mL Final  Admission on 06/27/2015, Discharged on 06/27/2015  Component Date Value Ref Range Status  . Sodium 06/27/2015 128* 135 - 145 mmol/L Final  . Potassium 06/27/2015 4.7  3.5 - 5.1 mmol/L Final   HEMOLYSIS AT THIS LEVEL MAY AFFECT RESULT  . Chloride 06/27/2015 97* 101 - 111 mmol/L Final  . CO2 06/27/2015 24  22 - 32 mmol/L Final  . Glucose, Bld 06/27/2015 122* 65 - 99 mg/dL Final  . BUN 06/27/2015 17  6 - 20 mg/dL Final  . Creatinine, Ser 06/27/2015 1.09* 0.44 - 1.00 mg/dL Final  . Calcium 06/27/2015 8.7* 8.9 - 10.3 mg/dL Final  . GFR calc non Af Amer 06/27/2015 42* >60 mL/min Final  . GFR calc Af Amer 06/27/2015 48* >60 mL/min Final   Comment: (NOTE) The  eGFR has been calculated using the CKD EPI equation. This calculation has not been validated in all clinical situations.  eGFR's persistently <60 mL/min signify possible Chronic Kidney Disease.   . Anion gap 06/27/2015 7  5 - 15 Final  . Troponin I 06/27/2015 0.27* <0.031 ng/mL Final   Comment: READ BACK AND VERIFIED WITH STEVE SNIDER AT 2020 06/27/2015 BY [facility]        PERSISTENTLY INCREASED TROPONIN VALUES IN THE RANGE OF 0.04-0.49 ng/mL CAN BE SEEN IN:       -UNSTABLE ANGINA       -CONGESTIVE HEART FAILURE       -MYOCARDITIS       -CHEST TRAUMA       -ARRYHTHMIAS       -LATE PRESENTING MYOCARDIAL INFARCTION       -COPD   CLINICAL FOLLOW-UP RECOMMENDED.   Marland Kitchen Troponin I 06/27/2015 <0.03  <0.031 ng/mL Final   Comment:        NO INDICATION OF MYOCARDIAL INJURY.     Assessment:  Sara Robertson is a 81 y.o. female with progressive anemia since 11/2014.  RBCs are microcytic.  She denies any melena or hematochezia.  She has irritable bowel.  Last colonoscopy was greater than 10 years ago. She has never had an EGD. Stools were guaiac negative on 07/07/2014.  Diet is modest.  She has a distant history of B12 deficiency, but has not been on B12 in some time.  Symptomatically, she has had some chest discomfort and shortness of breath.  She has a history of reflux.  She has dysphagia with dry foods.  Exam is unremarkable.  Plan: 1. Review diagnosis of anemia and suspected iron deficiency.  Discuss work-up.  Discuss current symptoms.  Discuss transfusions of 2 units of PRBCs.  Patient declines admission to hospital.  Discuss 1 unit today and 1 unit on Monday, 07/02/2015.  Patient consented.  Discuss initiation of oral iron (ferrous sulfate) 1 tablet a day with OJ or vitamin C.  Discuss advancing dose as tolerated up to 1 pill TID.  Discuss increasing iron rich foods in diet.  Discussed guaiac stools.  Discuss evaluation by GI (upper and lower endoscopy +/- capsule study). 2. Labs today:  CBC  with diff, ferritin, iron studies, B12, folate, SPEP, free light chains, type and screen. 3. Guaiac cards x 3. 4. Transfuse 1 unit PRBCs today and 1 unit on 07/02/2015. 5. Begin ferrous sulfate 325 mg a day with OJ.  Advance as tolerated up to TID. 6. Discuss possible IV iron if unable to replete iron stored with oral iron. 7. RTC on 07/06/2015 for MD assess, review of work-up, and labs (CBC with diff)    Lequita Asal, MD  06/29/2015, 9:15 AM

## 2015-06-30 DIAGNOSIS — D509 Iron deficiency anemia, unspecified: Secondary | ICD-10-CM | POA: Diagnosis not present

## 2015-07-01 ENCOUNTER — Encounter: Payer: Self-pay | Admitting: Hematology and Oncology

## 2015-07-01 ENCOUNTER — Other Ambulatory Visit: Payer: Self-pay | Admitting: Hematology and Oncology

## 2015-07-01 DIAGNOSIS — D509 Iron deficiency anemia, unspecified: Secondary | ICD-10-CM

## 2015-07-01 LAB — KAPPA/LAMBDA LIGHT CHAINS
Kappa free light chain: 30.83 mg/L — ABNORMAL HIGH (ref 3.30–19.40)
Kappa, lambda light chain ratio: 0.92 (ref 0.26–1.65)
Lambda free light chains: 33.57 mg/L — ABNORMAL HIGH (ref 5.71–26.30)

## 2015-07-01 MED ORDER — SODIUM CHLORIDE 0.9 % IJ SOLN
10.0000 mL | INTRAMUSCULAR | Status: AC | PRN
  Filled 2015-07-01: qty 10

## 2015-07-01 MED ORDER — SODIUM CHLORIDE 0.9 % IV SOLN
250.0000 mL | Freq: Once | INTRAVENOUS | Status: AC
  Filled 2015-07-01: qty 250

## 2015-07-01 MED ORDER — SODIUM CHLORIDE 0.9 % IJ SOLN
3.0000 mL | INTRAMUSCULAR | Status: AC | PRN
  Filled 2015-07-01: qty 10

## 2015-07-01 MED ORDER — ACETAMINOPHEN 325 MG PO TABS: 650.0000 mg | ORAL_TABLET | Freq: Once | ORAL | Status: AC

## 2015-07-01 MED ORDER — HEPARIN SOD (PORK) LOCK FLUSH 100 UNIT/ML IV SOLN: 500.0000 [IU] | Freq: Every day | INTRAVENOUS | Status: AC | PRN

## 2015-07-01 MED ORDER — HEPARIN SOD (PORK) LOCK FLUSH 100 UNIT/ML IV SOLN: 250.0000 [IU] | INTRAVENOUS | Status: AC | PRN

## 2015-07-01 MED ORDER — DIPHENHYDRAMINE HCL 25 MG PO CAPS: 25.0000 mg | ORAL_CAPSULE | Freq: Once | ORAL | Status: AC

## 2015-07-02 ENCOUNTER — Other Ambulatory Visit: Payer: Self-pay | Admitting: *Deleted

## 2015-07-02 ENCOUNTER — Inpatient Hospital Stay: Payer: Medicare Other

## 2015-07-02 ENCOUNTER — Other Ambulatory Visit: Payer: Self-pay | Admitting: Hematology and Oncology

## 2015-07-02 ENCOUNTER — Other Ambulatory Visit: Payer: Medicare Other

## 2015-07-02 VITALS — BP 159/69 | HR 60 | Temp 95.8°F | Resp 20

## 2015-07-02 DIAGNOSIS — D509 Iron deficiency anemia, unspecified: Secondary | ICD-10-CM

## 2015-07-02 DIAGNOSIS — D649 Anemia, unspecified: Secondary | ICD-10-CM

## 2015-07-02 LAB — OCCULT BLOOD X 1 CARD TO LAB, STOOL
Fecal Occult Bld: NEGATIVE
Fecal Occult Bld: NEGATIVE
Fecal Occult Bld: NEGATIVE

## 2015-07-02 LAB — PREPARE RBC (CROSSMATCH)

## 2015-07-02 LAB — PROTEIN ELECTROPHORESIS, SERUM
A/G Ratio: 0.9 (ref 0.7–1.7)
Albumin ELP: 3.1 g/dL (ref 2.9–4.4)
Alpha-1-Globulin: 0.3 g/dL (ref 0.0–0.4)
Alpha-2-Globulin: 0.8 g/dL (ref 0.4–1.0)
Beta Globulin: 1 g/dL (ref 0.7–1.3)
Gamma Globulin: 1.5 g/dL (ref 0.4–1.8)
Globulin, Total: 3.5 g/dL (ref 2.2–3.9)
Total Protein ELP: 6.6 g/dL (ref 6.0–8.5)

## 2015-07-02 MED ORDER — ACETAMINOPHEN 325 MG PO TABS
650.0000 mg | ORAL_TABLET | Freq: Once | ORAL | Status: AC
  Administered 2015-07-02: 650 mg via ORAL
  Filled 2015-07-02: qty 2

## 2015-07-02 MED ORDER — DIPHENHYDRAMINE HCL 25 MG PO CAPS
25.0000 mg | ORAL_CAPSULE | Freq: Once | ORAL | Status: AC
  Administered 2015-07-02: 25 mg via ORAL
  Filled 2015-07-02: qty 1

## 2015-07-02 MED ORDER — SODIUM CHLORIDE 0.9 % IV SOLN
250.0000 mL | Freq: Once | INTRAVENOUS | Status: AC
  Administered 2015-07-02: 250 mL via INTRAVENOUS
  Filled 2015-07-02: qty 250

## 2015-07-03 LAB — TYPE AND SCREEN
ABO/RH(D): A POS
Antibody Screen: NEGATIVE
Unit division: 0
Unit division: 0

## 2015-07-04 ENCOUNTER — Other Ambulatory Visit: Payer: Self-pay

## 2015-07-04 DIAGNOSIS — D509 Iron deficiency anemia, unspecified: Secondary | ICD-10-CM

## 2015-07-06 ENCOUNTER — Other Ambulatory Visit: Payer: Self-pay | Admitting: Hematology and Oncology

## 2015-07-06 ENCOUNTER — Inpatient Hospital Stay (HOSPITAL_BASED_OUTPATIENT_CLINIC_OR_DEPARTMENT_OTHER): Payer: Medicare Other | Admitting: Hematology and Oncology

## 2015-07-06 ENCOUNTER — Other Ambulatory Visit: Payer: Self-pay

## 2015-07-06 ENCOUNTER — Inpatient Hospital Stay: Payer: Medicare Other

## 2015-07-06 VITALS — BP 126/67 | HR 69 | Temp 97.0°F | Resp 18 | Ht 59.0 in | Wt 177.0 lb

## 2015-07-06 DIAGNOSIS — D509 Iron deficiency anemia, unspecified: Secondary | ICD-10-CM

## 2015-07-06 DIAGNOSIS — Z7982 Long term (current) use of aspirin: Secondary | ICD-10-CM

## 2015-07-06 DIAGNOSIS — Z79899 Other long term (current) drug therapy: Secondary | ICD-10-CM

## 2015-07-06 DIAGNOSIS — E785 Hyperlipidemia, unspecified: Secondary | ICD-10-CM

## 2015-07-06 DIAGNOSIS — R0602 Shortness of breath: Secondary | ICD-10-CM | POA: Diagnosis not present

## 2015-07-06 DIAGNOSIS — J90 Pleural effusion, not elsewhere classified: Secondary | ICD-10-CM

## 2015-07-06 DIAGNOSIS — K219 Gastro-esophageal reflux disease without esophagitis: Secondary | ICD-10-CM

## 2015-07-06 DIAGNOSIS — I2510 Atherosclerotic heart disease of native coronary artery without angina pectoris: Secondary | ICD-10-CM

## 2015-07-06 DIAGNOSIS — I10 Essential (primary) hypertension: Secondary | ICD-10-CM

## 2015-07-06 DIAGNOSIS — R0789 Other chest pain: Secondary | ICD-10-CM | POA: Diagnosis not present

## 2015-07-06 DIAGNOSIS — G4733 Obstructive sleep apnea (adult) (pediatric): Secondary | ICD-10-CM

## 2015-07-06 LAB — CBC WITH DIFFERENTIAL/PLATELET
Basophils Absolute: 0.1 10*3/uL (ref 0–0.1)
Basophils Relative: 1 %
Eosinophils Absolute: 0.3 10*3/uL (ref 0–0.7)
Eosinophils Relative: 3 %
HCT: 34.8 % — ABNORMAL LOW (ref 35.0–47.0)
Hemoglobin: 11.2 g/dL — ABNORMAL LOW (ref 12.0–16.0)
Lymphocytes Relative: 13 %
Lymphs Abs: 1.2 10*3/uL (ref 1.0–3.6)
MCH: 24.7 pg — ABNORMAL LOW (ref 26.0–34.0)
MCHC: 32.2 g/dL (ref 32.0–36.0)
MCV: 76.5 fL — ABNORMAL LOW (ref 80.0–100.0)
Monocytes Absolute: 1 10*3/uL — ABNORMAL HIGH (ref 0.2–0.9)
Monocytes Relative: 12 %
Neutro Abs: 6.3 10*3/uL (ref 1.4–6.5)
Neutrophils Relative %: 71 %
Platelets: 298 10*3/uL (ref 150–440)
RBC: 4.55 MIL/uL (ref 3.80–5.20)
RDW: 21.2 % — ABNORMAL HIGH (ref 11.5–14.5)
WBC: 8.9 10*3/uL (ref 3.6–11.0)

## 2015-07-11 LAB — METHYLMALONIC ACID, SERUM: Methylmalonic Acid, Quantitative: 302 nmol/L (ref 0–378)

## 2015-08-06 ENCOUNTER — Encounter: Payer: Self-pay | Admitting: Hematology and Oncology

## 2015-08-06 ENCOUNTER — Inpatient Hospital Stay: Payer: Medicare Other

## 2015-08-06 ENCOUNTER — Inpatient Hospital Stay: Payer: Medicare Other | Admitting: Hematology and Oncology

## 2015-08-06 NOTE — Progress Notes (Signed)
[facility] day:  07/06/2015  Chief Complaint: Sara Robertson is a 82 y.o. female  with anemia who is seen for review of work-up and discussion regarding direction of therapy.  HPI: The patient was last seen in the medical oncology clinic on 06/29/2015.  At that time, she was seen for initial consultation.  She had progressive microcytic anemia since 11/2014.  She denies any melena or hematochezia. Last colonoscopy was greater than 10 years ago. She has never had an EGD. Stools were guaiac negative on 07/07/2014.  Diet was modest.  She had a distant history of B12 deficiency, but had not been on B12 in some time.  She underwent a work-up.  CBC included a hematocrit of 23.8, hemoglobin 7.7, MCV 71.9, platelets 297,000, white count 7500 with a normal differential. Iron studies included a ferritin of 8, iron saturation 4%, and a TIBC of 471 (high).  Folate was 15.8.  B12 was 286.  Serum protein electrophoresis revealed no monoclonal protein. Kappa free light chains were 30.83, lambda free light chains 33.57 with a ratio of 0.920 (normal).  Guaiac cards x 3 were negative (06/29/2015 - 07/01/2015).  Symptomatically, because of her chest discomfort and shortness of breath, she was transfused with 1 unit of packed red blood cells and begun on oral iron.  During the interim, she states that she feels great.  She tolerated her transfusion well.  She has been on oral iron x 3 days.  Her diet is better (liver, beef, raisins, beets, spinach).  She feels like herself again.  Past Medical History  Diagnosis Date  . Hypertension   . Hyperlipemia   . CAD (coronary artery disease)   . Angina pectoris ([place])   . SOB (shortness of breath)   . GERD (gastroesophageal reflux disease)   . OSA (obstructive sleep apnea)   . Headache   . Anemia     No past surgical history on file.  Family History  Problem Relation Age of Onset  . Cancer Mother   . Cancer Sister    . Cancer Brother     Social History:  reports that she has never smoked. She does not have any smokeless tobacco history on file. She reports that she does not drink alcohol. Her drug history is not on file.  The patient is accompanied by her daughter, [name], today.  Allergies:  Allergies  Allergen Reactions  . Celebrex [Celecoxib] Nausea And Vomiting  . Motrin [Ibuprofen] Nausea And Vomiting  . Nexium [Esomeprazole Magnesium] Nausea And Vomiting    Current Medications: Current Outpatient Prescriptions  Medication Sig Dispense Refill  . aspirin EC 81 MG tablet Take 81 mg by mouth daily.    . bisoprolol-hydrochlorothiazide (ZIAC) 5-6.25 MG tablet Take 1 tablet by mouth daily.    . ferrous sulfate 325 (65 FE) MG tablet Take 325 mg by mouth daily with breakfast.    . losartan (COZAAR) 50 MG tablet Take 50 mg by mouth daily.    . Multiple Vitamin (MULTIVITAMIN WITH MINERALS) TABS tablet Take 1 tablet by mouth daily.    . Multiple Vitamins-Minerals (PRESERVISION AREDS 2) CAPS Take 2 capsules by mouth daily.    . nitrofurantoin (MACRODANTIN) 50 MG capsule Take 50 mg by mouth daily.    Marland Kitchen omeprazole (PRILOSEC) 40 MG capsule Take 40 mg by mouth daily.    . pravastatin (PRAVACHOL) 40 MG tablet Take 40 mg by mouth daily.    . clopidogrel (PLAVIX) 75  MG tablet Take 75 mg by mouth daily.     No current facility-administered medications for this visit.   Facility-Administered Medications Ordered in Other Visits  Medication Dose Route Frequency Provider Last Rate Last Dose  . 0.9 %  sodium chloride infusion  250 mL Intravenous Once Lequita Asal, MD      . acetaminophen (TYLENOL) tablet 650 mg  650 mg Oral Once Lequita Asal, MD      . diphenhydrAMINE (BENADRYL) capsule 25 mg  25 mg Oral Once Lequita Asal, MD      . heparin lock flush 100 unit/mL  500 Units Intracatheter Daily PRN Lequita Asal, MD      . heparin lock flush 100 unit/mL  250 Units Intracatheter PRN Lequita Asal, MD      . sodium chloride 0.9 % injection 10 mL  10 mL Intracatheter PRN Lequita Asal, MD      . sodium chloride 0.9 % injection 3 mL  3 mL Intracatheter PRN Lequita Asal, MD        Review of Systems:  GENERAL:  Feels great and "back to self".  No fevers, sweats or weight loss. PERFORMANCE STATUS (ECOG):  1 HEENT:  No visual changes, runny nose, sore throat, mouth sores or tenderness. Lungs: No shortness of breath or cough.  No hemoptysis. Cardiac:  No chest pain, palpitations, orthopnea, or PND. GI:  Reflux.  Difficulty with dry foods.  Irritable bowel.  Hard stools and explosive diarrhea.  No nausea, vomiting,melena or hematochezia. GU:  Recurrent UTIs.  No urgency, frequency, dysuria, or hematuria. Musculoskeletal:  No back pain.  No joint pain.  No muscle tenderness. Extremities:  No pain or swelling. Skin:  No rashes or skin changes. Neuro:  No headache, numbness or weakness, balance or coordination issues. Endocrine:  No diabetes, thyroid issues, hot flashes or night sweats. Psych:  No mood changes, depression or anxiety. Pain:  No focal pain. Review of systems:  All other systems reviewed and found to be negative.  Physical Exam: Blood pressure 126/67, pulse 69, temperature 97 F (36.1 C), temperature source Tympanic, resp. rate 18, height 4\' 11"  (1.499 m), weight 177 lb 0.5 oz (80.3 kg). GENERAL:  Well developed, well nourished, sitting in the exam room in no acute distress.  She has a rolling walker at her side. MENTAL STATUS:  Alert and oriented to person, place and time. HEAD:  Pearline Cables hair.  Normocephalic, atraumatic, face symmetric, no Cushingoid features. EYES:  Glasses.  Brown/hazel eyes.  Pupils equal round and reactive to light and accomodation.  No conjunctivitis or scleral icterus. ENT:  Oropharynx clear without lesion.  Tongue normal. Mucous membranes moist.  RESPIRATORY:  Clear to auscultation without rales, wheezes or rhonchi. CARDIOVASCULAR:   Regular rate and rhythm without murmur, rub or gallop. ABDOMEN:  Soft, non-tender, with active bowel sounds, and no hepatosplenomegaly.  No masses. SKIN:  Pale.  Ecchymosis dorsum left hand.  No rashes, ulcers or lesions. EXTREMITIES: No edema, no skin discoloration or tenderness.  No palpable cords. LYMPH NODES: No palpable cervical, supraclavicular, axillary or inguinal adenopathy  NEUROLOGICAL: Unremarkable. PSYCH:  Appropriate.  Appointment on 07/06/2015  Component Date Value Ref Range Status  . Methylmalonic Acid, Quantitative 07/06/2015 302  0 - 378 nmol/L Final   Comment: (NOTE) Performed At: [facility] [place], [place] [id number] Lindon Romp MD [id number]   . WBC 07/06/2015 8.9  3.6 - 11.0 K/uL Final  .  RBC 07/06/2015 4.55  3.80 - 5.20 MIL/uL Final  . Hemoglobin 07/06/2015 11.2* 12.0 - 16.0 g/dL Final  . HCT 07/06/2015 34.8* 35.0 - 47.0 % Final  . MCV 07/06/2015 76.5* 80.0 - 100.0 fL Final  . MCH 07/06/2015 24.7* 26.0 - 34.0 pg Final  . MCHC 07/06/2015 32.2  32.0 - 36.0 g/dL Final  . RDW 07/06/2015 21.2* 11.5 - 14.5 % Final  . Platelets 07/06/2015 298  150 - 440 K/uL Final  . Neutrophils Relative % 07/06/2015 71   Final  . Neutro Abs 07/06/2015 6.3  1.4 - 6.5 K/uL Final  . Lymphocytes Relative 07/06/2015 13   Final  . Lymphs Abs 07/06/2015 1.2  1.0 - 3.6 K/uL Final  . Monocytes Relative 07/06/2015 12   Final  . Monocytes Absolute 07/06/2015 1.0* 0.2 - 0.9 K/uL Final  . Eosinophils Relative 07/06/2015 3   Final  . Eosinophils Absolute 07/06/2015 0.3  0 - 0.7 K/uL Final  . Basophils Relative 07/06/2015 1   Final  . Basophils Absolute 07/06/2015 0.1  0 - 0.1 K/uL Final    Assessment:  TEIRA MARCOE is a 82 y.o. female with progressive iron deficiency anemia since 11/2014.  RBCs are microcytic.  She denies any melena or hematochezia.  She has irritable bowel.  Last colonoscopy was greater than 10 years ago. She has never had an EGD. Stools  were guaiac negative on 07/07/2014.  Diet is modest.  She has a distant history of B12 deficiency, but has not been on B12 in some time.  Work-upon 06/29/2015 included a hematocrit of 23.8, hemoglobin 7.7, MCV 71.9, platelets 297,000, white count 7500 with a normal differential. Iron studies revealed iron deficiency anemia with a ferritin of 8, iron saturation 4%, and a TIBC of 471 (high).  Folate was 15.8 .  B12 was 286 (low normal).  SPEP revealed no monoclonal protein. Kappa free light chain ratio was normal.  Guaiac cards x 3 were negative (06/29/2015 - 07/01/2015).  Symptomatically, because of her chest discomfort and shortness of breath, she was transfused with 1 unit of packed red blood cells on 06/30/2015 and begun on oral iron.  She has been on oral iron x 3 days.  Her diet is better (liver, beef, raisins, beets, spinach). She declines EGD and colonoscopy.  Symptomatically, she feels back to baseline.  Exam is unremarkable.  Plan: 1. Review work-up and diagnosis of iron deficiency anemia.  2. Labs today:  CBC with diff, MMA. 3. Continue ferrous sulfate 325 mg a day with OJ.  Advance as tolerated up to TID.  Goal ferritin 100. 4. RTC in 1 month for MD assess and labs (CBC with diff, ferritin, iron studies).    Lequita Asal, MD  07/06/2015

## 2015-08-10 ENCOUNTER — Other Ambulatory Visit: Payer: Self-pay

## 2015-08-10 DIAGNOSIS — D509 Iron deficiency anemia, unspecified: Secondary | ICD-10-CM

## 2015-08-13 ENCOUNTER — Inpatient Hospital Stay (HOSPITAL_BASED_OUTPATIENT_CLINIC_OR_DEPARTMENT_OTHER): Payer: Medicare Other | Admitting: Hematology and Oncology

## 2015-08-13 ENCOUNTER — Inpatient Hospital Stay: Payer: Medicare Other | Attending: Hematology and Oncology

## 2015-08-13 VITALS — BP 110/68 | HR 65 | Temp 97.7°F | Resp 18 | Ht 59.0 in | Wt 185.2 lb

## 2015-08-13 DIAGNOSIS — Z79899 Other long term (current) drug therapy: Secondary | ICD-10-CM

## 2015-08-13 DIAGNOSIS — Z87440 Personal history of urinary (tract) infections: Secondary | ICD-10-CM

## 2015-08-13 DIAGNOSIS — K589 Irritable bowel syndrome without diarrhea: Secondary | ICD-10-CM | POA: Diagnosis not present

## 2015-08-13 DIAGNOSIS — K219 Gastro-esophageal reflux disease without esophagitis: Secondary | ICD-10-CM | POA: Diagnosis not present

## 2015-08-13 DIAGNOSIS — I2510 Atherosclerotic heart disease of native coronary artery without angina pectoris: Secondary | ICD-10-CM | POA: Insufficient documentation

## 2015-08-13 DIAGNOSIS — D509 Iron deficiency anemia, unspecified: Secondary | ICD-10-CM

## 2015-08-13 DIAGNOSIS — Z7982 Long term (current) use of aspirin: Secondary | ICD-10-CM | POA: Insufficient documentation

## 2015-08-13 DIAGNOSIS — I10 Essential (primary) hypertension: Secondary | ICD-10-CM

## 2015-08-13 DIAGNOSIS — E785 Hyperlipidemia, unspecified: Secondary | ICD-10-CM | POA: Insufficient documentation

## 2015-08-13 DIAGNOSIS — G4733 Obstructive sleep apnea (adult) (pediatric): Secondary | ICD-10-CM

## 2015-08-13 LAB — CBC WITH DIFFERENTIAL/PLATELET
Basophils Absolute: 0.2 10*3/uL — ABNORMAL HIGH (ref 0–0.1)
Basophils Relative: 3 %
Eosinophils Absolute: 0.6 10*3/uL (ref 0–0.7)
Eosinophils Relative: 10 %
HCT: 34.8 % — ABNORMAL LOW (ref 35.0–47.0)
Hemoglobin: 11.4 g/dL — ABNORMAL LOW (ref 12.0–16.0)
Lymphocytes Relative: 16 %
Lymphs Abs: 1 10*3/uL (ref 1.0–3.6)
MCH: 26.4 pg (ref 26.0–34.0)
MCHC: 32.9 g/dL (ref 32.0–36.0)
MCV: 80.2 fL (ref 80.0–100.0)
Monocytes Absolute: 1.1 10*3/uL — ABNORMAL HIGH (ref 0.2–0.9)
Monocytes Relative: 17 %
Neutro Abs: 3.4 10*3/uL (ref 1.4–6.5)
Neutrophils Relative %: 54 %
Platelets: 238 10*3/uL (ref 150–440)
RBC: 4.33 MIL/uL (ref 3.80–5.20)
RDW: 25.3 % — ABNORMAL HIGH (ref 11.5–14.5)
WBC: 6.3 10*3/uL (ref 3.6–11.0)

## 2015-08-13 LAB — IRON AND TIBC
Iron: 66 ug/dL (ref 28–170)
Saturation Ratios: 20 % (ref 10.4–31.8)
TIBC: 332 ug/dL (ref 250–450)
UIBC: 266 ug/dL

## 2015-08-13 LAB — FERRITIN: Ferritin: 19 ng/mL (ref 11–307)

## 2015-08-13 NOTE — Progress Notes (Signed)
[facility] day:  08/13/2015   Chief Complaint: Sara Robertson is a 82 y.o. female  with iron deficiency anemia who is seen for 1 month assessment.  HPI: The patient was last seen in the medical oncology clinic on 07/06/2015.  At that time, she felt back to baseline.  She was on ferrous sulfate 325 mg a day with OJ.  She was encouraged to advance as tolerated up to TID.  Hematocrit had improved from 23.8 to 34.8.  MMA was 302 (normal).  Symptomatically, she states that she feels "so much better".  She has been on oral iron BID.  Her diet is better (raisins, beets) and rarely liver.  She feels sassy.   Past Medical History  Diagnosis Date  . Hypertension   . Hyperlipemia   . CAD (coronary artery disease)   . Angina pectoris ([place])   . SOB (shortness of breath)   . GERD (gastroesophageal reflux disease)   . OSA (obstructive sleep apnea)   . Headache   . Anemia     No past surgical history on file.  Family History  Problem Relation Age of Onset  . Cancer Mother   . Cancer Sister   . Cancer Brother     Social History:  reports that she has never smoked. She does not have any smokeless tobacco history on file. She reports that she does not drink alcohol. Her drug history is not on file.  The patient is accompanied by her daughter, Sara Robertson, today.  Allergies:  Allergies  Allergen Reactions  . Celebrex [Celecoxib] Nausea And Vomiting  . Motrin [Ibuprofen] Nausea And Vomiting  . Nexium [Esomeprazole Magnesium] Nausea And Vomiting    Current Medications: Current Outpatient Prescriptions  Medication Sig Dispense Refill  . ampicillin (PRINCIPEN) 250 MG capsule Take 250 mg by mouth daily.    Marland Kitchen aspirin EC 81 MG tablet Take 81 mg by mouth daily.    . bisoprolol-hydrochlorothiazide (ZIAC) 5-6.25 MG tablet Take 1 tablet by mouth daily.    . ferrous sulfate 325 (65 FE) MG tablet Take 325 mg by mouth daily with breakfast.    . furosemide  (LASIX) 20 MG tablet Take 20 mg by mouth daily. 1/2 tab    . losartan (COZAAR) 50 MG tablet Take 50 mg by mouth daily.    . Multiple Vitamin (MULTIVITAMIN WITH MINERALS) TABS tablet Take 1 tablet by mouth daily.    . vitamin B-12 (CYANOCOBALAMIN) 500 MCG tablet Take 500 mcg by mouth daily.     No current facility-administered medications for this visit.   Facility-Administered Medications Ordered in Other Visits  Medication Dose Route Frequency Provider Last Rate Last Dose  . 0.9 %  sodium chloride infusion  250 mL Intravenous Once Lequita Asal, MD      . acetaminophen (TYLENOL) tablet 650 mg  650 mg Oral Once Lequita Asal, MD      . diphenhydrAMINE (BENADRYL) capsule 25 mg  25 mg Oral Once Lequita Asal, MD      . heparin lock flush 100 unit/mL  500 Units Intracatheter Daily PRN Lequita Asal, MD      . heparin lock flush 100 unit/mL  250 Units Intracatheter PRN Lequita Asal, MD      . sodium chloride 0.9 % injection 10 mL  10 mL Intracatheter PRN Lequita Asal, MD      . sodium chloride 0.9 % injection 3 mL  3 mL  Intracatheter PRN Lequita Asal, MD        Review of Systems:  GENERAL:  Feels "so much better" and sassy.  No fevers, sweats or weight loss. PERFORMANCE STATUS (ECOG):  1 HEENT:  No visual changes, runny nose, sore throat, mouth sores or tenderness. Lungs: No shortness of breath or cough.  No hemoptysis. Cardiac:  No chest pain, palpitations, orthopnea, or PND. GI:  Reflux.  Irritable bowel.  Hard stools and explosive diarrhea.  No nausea, vomiting,melena or hematochezia. GU:  Recurrent UTIs.  No urgency, frequency, dysuria, or hematuria. Musculoskeletal:  No back pain.  No joint pain.  No muscle tenderness. Extremities:  No pain or swelling. Skin:  No rashes or skin changes. Neuro:  No headache, numbness or weakness, balance or coordination issues. Endocrine:  No diabetes, thyroid issues, hot flashes or night sweats. Psych:  No mood  changes, depression or anxiety. Pain:  No focal pain. Review of systems:  All other systems reviewed and found to be negative.  Physical Exam: Blood pressure 110/68, pulse 65, temperature 97.7 F (36.5 C), temperature source Tympanic, resp. rate 18, height 4\' 11"  (1.499 m), weight 185 lb 3 oz (84 kg). GENERAL:  Well developed, well nourished, sitting in the exam room in no acute distress.  She has a rolling walker at her side. MENTAL STATUS:  Alert and oriented to person, place and time. HEAD:  Curly white hair.  Normocephalic, atraumatic, face symmetric, no Cushingoid features. EYES:  Copper rimmed glasses.  Brown eyes.  Pupils equal round and reactive to light and accomodation.  No conjunctivitis or scleral icterus. ENT:  Oropharynx clear without lesion.  Tongue normal. Mucous membranes moist.  RESPIRATORY:  Clear to auscultation without rales, wheezes or rhonchi. CARDIOVASCULAR:  Regular rate and rhythm without murmur, rub or gallop. ABDOMEN:  Soft, non-tender, with active bowel sounds, and no hepatosplenomegaly.  No masses. SKIN:  Pale.  No rashes, ulcers or lesions. EXTREMITIES: No edema, no skin discoloration or tenderness.  No palpable cords. LYMPH NODES: No palpable cervical, supraclavicular, axillary or inguinal adenopathy  NEUROLOGICAL: Unremarkable. PSYCH:  Appropriate.  Appointment on 08/13/2015  Component Date Value Ref Range Status  . WBC 08/13/2015 6.3  3.6 - 11.0 K/uL Final  . RBC 08/13/2015 4.33  3.80 - 5.20 MIL/uL Final  . Hemoglobin 08/13/2015 11.4* 12.0 - 16.0 g/dL Final  . HCT 08/13/2015 34.8* 35.0 - 47.0 % Final  . MCV 08/13/2015 80.2  80.0 - 100.0 fL Final  . MCH 08/13/2015 26.4  26.0 - 34.0 pg Final  . MCHC 08/13/2015 32.9  32.0 - 36.0 g/dL Final  . RDW 08/13/2015 25.3* 11.5 - 14.5 % Final  . Platelets 08/13/2015 238  150 - 440 K/uL Final  . Neutrophils Relative % 08/13/2015 54   Final  . Neutro Abs 08/13/2015 3.4  1.4 - 6.5 K/uL Final  . Lymphocytes Relative  08/13/2015 16   Final  . Lymphs Abs 08/13/2015 1.0  1.0 - 3.6 K/uL Final  . Monocytes Relative 08/13/2015 17   Final  . Monocytes Absolute 08/13/2015 1.1* 0.2 - 0.9 K/uL Final  . Eosinophils Relative 08/13/2015 10   Final  . Eosinophils Absolute 08/13/2015 0.6  0 - 0.7 K/uL Final  . Basophils Relative 08/13/2015 3   Final  . Basophils Absolute 08/13/2015 0.2* 0 - 0.1 K/uL Final  . Ferritin 08/13/2015 19  11 - 307 ng/mL Final  . Iron 08/13/2015 66  28 - 170 ug/dL Final  . TIBC 08/13/2015 332  250 - 450 ug/dL Final  . Saturation Ratios 08/13/2015 20  10.4 - 31.8 % Final  . UIBC 08/13/2015 266   Final    Assessment:  Sara Robertson is a 82 y.o. female with progressive iron deficiency anemia since 11/2014.  RBCs are microcytic.  She denies any melena or hematochezia.  She has irritable bowel.  Last colonoscopy was greater than 10 years ago. She has never had an EGD. Stools were guaiac negative on 07/07/2014.  Diet is modest.  She has a distant history of B12 deficiency, but has not been on B12 in some time.  Work-upon 06/29/2015 included a hematocrit of 23.8, hemoglobin 7.7, MCV 71.9, platelets 297,000, white count 7500 with a normal differential. Iron studies revealed iron deficiency anemia with a ferritin of 8, iron saturation 4%, and a TIBC of 471 (high).  Folate was 15.8 .  B12 was 286 (low normal).  MMA was 302 (normal) on 07/06/2015.  SPEP revealed no monoclonal protein. Kappa free light chain ratio was normal.  Guaiac cards x 3 were negative (06/29/2015 - 07/01/2015).  She was transfused with 1 unit of packed red blood cells on 06/30/2015 because of shortness of breath and chest discomfort.  She has been on oral iron.  Her diet is better (liver, beef, raisins, beets, spinach).  She declined EGD and colonoscopy.  Symptomatically, she feels back great.  Exam is unremarkable.  Hematocrit is 34.8.  Ferritin is 19.  Plan: 1. Labs today:  CBC with diff, ferritin, iron studies. 2. Continue  ferrous sulfate 325 mg BID with OJ.   Goal ferritin 100. 3. RTC in 3 months for MD assess and labs (CBC with diff, ferritin).    Lequita Asal, MD  08/13/2015, 12:22 PM

## 2015-09-04 ENCOUNTER — Encounter: Payer: Self-pay | Admitting: Hematology and Oncology

## 2015-11-12 ENCOUNTER — Inpatient Hospital Stay: Payer: Medicare Other | Attending: Hematology and Oncology | Admitting: Hematology and Oncology

## 2015-11-12 ENCOUNTER — Encounter: Payer: Self-pay | Admitting: Hematology and Oncology

## 2015-11-12 ENCOUNTER — Inpatient Hospital Stay: Payer: Medicare Other

## 2015-11-12 ENCOUNTER — Telehealth: Payer: Self-pay

## 2015-11-12 VITALS — BP 145/76 | HR 65 | Temp 98.5°F | Resp 20 | Wt 187.2 lb

## 2015-11-12 DIAGNOSIS — K5900 Constipation, unspecified: Secondary | ICD-10-CM

## 2015-11-12 DIAGNOSIS — G4733 Obstructive sleep apnea (adult) (pediatric): Secondary | ICD-10-CM | POA: Diagnosis not present

## 2015-11-12 DIAGNOSIS — I2510 Atherosclerotic heart disease of native coronary artery without angina pectoris: Secondary | ICD-10-CM

## 2015-11-12 DIAGNOSIS — R197 Diarrhea, unspecified: Secondary | ICD-10-CM

## 2015-11-12 DIAGNOSIS — K219 Gastro-esophageal reflux disease without esophagitis: Secondary | ICD-10-CM

## 2015-11-12 DIAGNOSIS — I10 Essential (primary) hypertension: Secondary | ICD-10-CM | POA: Diagnosis not present

## 2015-11-12 DIAGNOSIS — E785 Hyperlipidemia, unspecified: Secondary | ICD-10-CM

## 2015-11-12 DIAGNOSIS — K589 Irritable bowel syndrome without diarrhea: Secondary | ICD-10-CM | POA: Diagnosis not present

## 2015-11-12 DIAGNOSIS — Z79899 Other long term (current) drug therapy: Secondary | ICD-10-CM

## 2015-11-12 DIAGNOSIS — D509 Iron deficiency anemia, unspecified: Secondary | ICD-10-CM

## 2015-11-12 LAB — CBC WITH DIFFERENTIAL/PLATELET
Basophils Absolute: 0.2 10*3/uL — ABNORMAL HIGH (ref 0–0.1)
Basophils Relative: 3 %
Eosinophils Absolute: 0.4 10*3/uL (ref 0–0.7)
Eosinophils Relative: 5 %
HCT: 36 % (ref 35.0–47.0)
Hemoglobin: 12.5 g/dL (ref 12.0–16.0)
Lymphocytes Relative: 21 %
Lymphs Abs: 1.4 10*3/uL (ref 1.0–3.6)
MCH: 32 pg (ref 26.0–34.0)
MCHC: 34.8 g/dL (ref 32.0–36.0)
MCV: 92 fL (ref 80.0–100.0)
Monocytes Absolute: 0.8 10*3/uL (ref 0.2–0.9)
Monocytes Relative: 12 %
Neutro Abs: 4 10*3/uL (ref 1.4–6.5)
Neutrophils Relative %: 59 %
Platelets: 196 10*3/uL (ref 150–440)
RBC: 3.91 MIL/uL (ref 3.80–5.20)
RDW: 13.4 % (ref 11.5–14.5)
WBC: 6.7 10*3/uL (ref 3.6–11.0)

## 2015-11-12 LAB — FERRITIN: Ferritin: 25 ng/mL (ref 11–307)

## 2015-11-12 NOTE — Telephone Encounter (Signed)
Called pt and spoke with pt and daughters.  Per Dr. Mike Gip she is still good with pt only taking her Iron daily verses BID.   No other concerns noted.

## 2015-11-12 NOTE — Progress Notes (Signed)
Here for IDA F/U visit today. Uses walker for safety; legs sometime feel weak. Denies dizziness. Appetite is good. Some fatigue, relieved by rest. Uses oxygen at nighttime. Stools are explosive about once a week if she is constipated. Stools are black d/t taking iron twice a day.

## 2015-11-12 NOTE — Progress Notes (Signed)
[facility] day:  11/12/2015   Chief Complaint: Sara Robertson is a 82 y.o. female with iron deficiency anemia who is seen for 3 month assessment.  HPI: The patient was last seen in the medical oncology clinic on 08/13/2015.  At that time, she felt great.  Exam was unremarkable.  Hematocrit was 34.8.  MCV was 80.2.  Ferritin was 19.  We discussed continuation of ferrous sulfate 325 mg BID with OJ.   Goal ferritin was 100.  During the interim, she has done well. She continues to take iron BID.  Stools are black.  She denies any bleeding.  She notes issues with constipation caused by the iron followed by explosive diarrhea.  She still does not like to eat meat.  She eats iron rich foods (raisins, beets) and rarely liver.    Past Medical History  Diagnosis Date  . Hypertension   . Hyperlipemia   . CAD (coronary artery disease)   . Angina pectoris ([employer] [facility] [place])   . SOB (shortness of breath)   . GERD (gastroesophageal reflux disease)   . OSA (obstructive sleep apnea)   . Headache   . Anemia     No past surgical history on file.  Family History  Problem Relation Age of Onset  . Cancer Mother   . Cancer Sister   . Cancer Brother     Social History:  reports that she has never smoked. She does not have any smokeless tobacco history on file. She reports that she does not drink alcohol. Her drug history is not on file.  The patient is accompanied by her 2 daughters , Sara Robertson (lives here) and Sara Robertson, today.  Allergies:  Allergies  Allergen Reactions  . Celebrex [Celecoxib] Nausea And Vomiting  . Motrin [Ibuprofen] Nausea And Vomiting  . Nexium [Esomeprazole Magnesium] Nausea And Vomiting    Current Medications: Current Outpatient Prescriptions  Medication Sig Dispense Refill  . ampicillin (PRINCIPEN) 250 MG capsule Take 250 mg by mouth 1 day or 1 dose.    Marland Kitchen aspirin 81 MG tablet Take 81 mg by mouth daily.    . bisoprolol-hydrochlorothiazide  (ZIAC) 5-6.25 MG tablet Take 1 tablet by mouth daily.    . cyanocobalamin 500 MCG tablet Take 500 mcg by mouth daily.    . ferrous sulfate 325 (65 FE) MG tablet Take 325 mg by mouth 2 (two) times daily with a meal.    . furosemide (LASIX) 20 MG tablet Take 20 mg by mouth. 1/2 tab daily    . losartan (COZAAR) 50 MG tablet Take 50 mg by mouth daily.    . Multiple Vitamins-Minerals (MULTIVITAMIN WITH MINERALS) tablet Take 1 tablet by mouth daily.     No current facility-administered medications for this visit.   Facility-Administered Medications Ordered in Other Visits  Medication Dose Route Frequency Provider Last Rate Last Dose  . 0.9 %  sodium chloride infusion  250 mL Intravenous Once Lequita Asal, MD      . acetaminophen (TYLENOL) tablet 650 mg  650 mg Oral Once Lequita Asal, MD      . diphenhydrAMINE (BENADRYL) capsule 25 mg  25 mg Oral Once Lequita Asal, MD      . heparin lock flush 100 unit/mL  500 Units Intracatheter Daily PRN Lequita Asal, MD      . heparin lock flush 100 unit/mL  250 Units Intracatheter PRN Lequita Asal, MD      . sodium  chloride 0.9 % injection 10 mL  10 mL Intracatheter PRN Lequita Asal, MD      . sodium chloride 0.9 % injection 3 mL  3 mL Intracatheter PRN Lequita Asal, MD        Review of Systems:  GENERAL:  Feels good.  No fevers or sweats.  Weight up 2 pounds. PERFORMANCE STATUS (ECOG):  1 HEENT:  No visual changes, runny nose, sore throat, mouth sores or tenderness. Lungs: No shortness of breath or cough.  No hemoptysis. Cardiac:  No chest pain, palpitations, orthopnea, or PND. GI:  Reflux.  Irritable bowel.  Constipation followed by explosive diarrhea.  No nausea, vomiting,melena or hematochezia. GU:  No urgency, frequency, dysuria, or hematuria. Musculoskeletal:  No back pain.  No joint pain.  No muscle tenderness. Extremities:  No pain or swelling. Skin:  No rashes or skin changes. Neuro:  No headache,  numbness or weakness, balance or coordination issues. Endocrine:  No diabetes, thyroid issues, hot flashes or night sweats. Psych:  No mood changes, depression or anxiety. Pain:  No focal pain. Review of systems:  All other systems reviewed and found to be negative.  Physical Exam: Blood pressure 145/76, pulse 65, temperature 98.5 F (36.9 C), temperature source Oral, resp. rate 20, weight 187 lb 2.7 oz (84.9 kg), SpO2 98 %. GENERAL:  Well developed, well nourished, sitting in the exam room in no acute distress.  She has a rolling walker at her side. MENTAL STATUS:  Alert and oriented to person, place and time. HEAD:  Curly white hair.  Normocephalic, atraumatic, face symmetric, no Cushingoid features. EYES:  Copper rimmed glasses.  Brown eyes.  Pupils equal round and reactive to light and accomodation.  No conjunctivitis or scleral icterus. ENT:  Oropharynx clear without lesion.  Tongue normal. Mucous membranes moist.  RESPIRATORY:  Clear to auscultation without rales, wheezes or rhonchi. CARDIOVASCULAR:  Regular rate and rhythm without murmur, rub or gallop. ABDOMEN:  Soft, non-tender, with active bowel sounds, and no appreciable hepatosplenomegaly.  No masses. SKIN:  Pale.  No rashes, ulcers or lesions. EXTREMITIES: No edema, no skin discoloration or tenderness.  No palpable cords. LYMPH NODES: No palpable cervical, supraclavicular, axillary or inguinal adenopathy  NEUROLOGICAL: Unremarkable. PSYCH:  Appropriate.  Appointment on 11/12/2015  Component Date Value Ref Range Status  . WBC 11/12/2015 6.7  3.6 - 11.0 K/uL Final  . RBC 11/12/2015 3.91  3.80 - 5.20 MIL/uL Final  . Hemoglobin 11/12/2015 12.5  12.0 - 16.0 g/dL Final  . HCT 11/12/2015 36.0  35.0 - 47.0 % Final  . MCV 11/12/2015 92.0  80.0 - 100.0 fL Final  . MCH 11/12/2015 32.0  26.0 - 34.0 pg Final  . MCHC 11/12/2015 34.8  32.0 - 36.0 g/dL Final  . RDW 11/12/2015 13.4  11.5 - 14.5 % Final  . Platelets 11/12/2015 196  150 -  440 K/uL Final  . Neutrophils Relative % 11/12/2015 59   Final  . Neutro Abs 11/12/2015 4.0  1.4 - 6.5 K/uL Final  . Lymphocytes Relative 11/12/2015 21   Final  . Lymphs Abs 11/12/2015 1.4  1.0 - 3.6 K/uL Final  . Monocytes Relative 11/12/2015 12   Final  . Monocytes Absolute 11/12/2015 0.8  0.2 - 0.9 K/uL Final  . Eosinophils Relative 11/12/2015 5   Final  . Eosinophils Absolute 11/12/2015 0.4  0 - 0.7 K/uL Final  . Basophils Relative 11/12/2015 3   Final  . Basophils Absolute 11/12/2015 0.2* 0 -  0.1 K/uL Final    Assessment:  XAYLA FORNWALT is a 82 y.o. female with progressive iron deficiency anemia since 11/2014.  RBCs are microcytic.  She denies any melena or hematochezia.  She has irritable bowel.  Last colonoscopy was greater than 10 years ago. She has never had an EGD. Stools were guaiac negative on 07/07/2014.  Diet is modest.  She has a distant history of B12 deficiency, but has not been on B12 in some time.  Work-upon 06/29/2015 included a hematocrit of 23.8, hemoglobin 7.7, MCV 71.9, platelets 297,000, white count 7500 with a normal differential. Iron studies revealed iron deficiency anemia with a ferritin of 8, iron saturation 4%, and a TIBC of 471 (high).  Folate was 15.8 .  B12 was 286 (low normal).  MMA was 302 (normal) on 07/06/2015.  SPEP revealed no monoclonal protein. Kappa free light chain ratio was normal.  Guaiac cards x 3 were negative (06/29/2015 - 07/01/2015).  She was transfused with 1 unit of packed red blood cells on 06/30/2015 because of shortness of breath and chest discomfort.  She has been on oral iron.  Her diet is better (liver, beef, raisins, beets, spinach).  She declined EGD and colonoscopy.  Symptomatically, she feels good.  She has issues with constipation (aggravated by iron) followed by explosive diarrhea.  Exam is unremarkable.  Hematocrit is 36.0.  MCV is normal (92).  Ferritin is pending.  Plan: 1.  Labs today:  CBC with diff, ferritin, iron  studies. 2.  Decrease  ferrous sulfate to 325 mg q day with OJ.   Goal ferritin 100. 3.  Call patient with ferritin result.  If near goal, will discontinue oral iron. 4.  RTC in 3 months for MD assess and labs (CBC with diff, ferritin).    Lequita Asal, MD  11/12/2015, 11:07 AM

## 2016-02-11 ENCOUNTER — Telehealth: Payer: Self-pay

## 2016-02-11 ENCOUNTER — Telehealth: Payer: Self-pay | Admitting: *Deleted

## 2016-02-11 ENCOUNTER — Encounter: Payer: Self-pay | Admitting: Hematology and Oncology

## 2016-02-11 ENCOUNTER — Inpatient Hospital Stay: Payer: Medicare Other | Attending: Hematology and Oncology | Admitting: Hematology and Oncology

## 2016-02-11 ENCOUNTER — Inpatient Hospital Stay: Payer: Medicare Other

## 2016-02-11 VITALS — BP 122/70 | HR 76 | Temp 98.6°F | Resp 18 | Wt 184.4 lb

## 2016-02-11 DIAGNOSIS — Z8669 Personal history of other diseases of the nervous system and sense organs: Secondary | ICD-10-CM | POA: Insufficient documentation

## 2016-02-11 DIAGNOSIS — I208 Other forms of angina pectoris: Secondary | ICD-10-CM | POA: Diagnosis not present

## 2016-02-11 DIAGNOSIS — E538 Deficiency of other specified B group vitamins: Secondary | ICD-10-CM | POA: Insufficient documentation

## 2016-02-11 DIAGNOSIS — D509 Iron deficiency anemia, unspecified: Secondary | ICD-10-CM | POA: Insufficient documentation

## 2016-02-11 DIAGNOSIS — G4730 Sleep apnea, unspecified: Secondary | ICD-10-CM | POA: Diagnosis not present

## 2016-02-11 DIAGNOSIS — Z7982 Long term (current) use of aspirin: Secondary | ICD-10-CM | POA: Insufficient documentation

## 2016-02-11 DIAGNOSIS — E785 Hyperlipidemia, unspecified: Secondary | ICD-10-CM | POA: Insufficient documentation

## 2016-02-11 DIAGNOSIS — R0602 Shortness of breath: Secondary | ICD-10-CM

## 2016-02-11 DIAGNOSIS — Z79899 Other long term (current) drug therapy: Secondary | ICD-10-CM

## 2016-02-11 DIAGNOSIS — I10 Essential (primary) hypertension: Secondary | ICD-10-CM | POA: Diagnosis not present

## 2016-02-11 DIAGNOSIS — K5900 Constipation, unspecified: Secondary | ICD-10-CM | POA: Insufficient documentation

## 2016-02-11 DIAGNOSIS — D508 Other iron deficiency anemias: Secondary | ICD-10-CM

## 2016-02-11 DIAGNOSIS — K219 Gastro-esophageal reflux disease without esophagitis: Secondary | ICD-10-CM | POA: Insufficient documentation

## 2016-02-11 DIAGNOSIS — Z809 Family history of malignant neoplasm, unspecified: Secondary | ICD-10-CM | POA: Insufficient documentation

## 2016-02-11 DIAGNOSIS — I2510 Atherosclerotic heart disease of native coronary artery without angina pectoris: Secondary | ICD-10-CM | POA: Diagnosis not present

## 2016-02-11 DIAGNOSIS — R197 Diarrhea, unspecified: Secondary | ICD-10-CM | POA: Diagnosis not present

## 2016-02-11 LAB — FERRITIN: Ferritin: 23 ng/mL (ref 11–307)

## 2016-02-11 LAB — CBC WITH DIFFERENTIAL/PLATELET
Basophils Absolute: 0.1 10*3/uL (ref 0–0.1)
Basophils Relative: 2 %
Eosinophils Absolute: 0.3 10*3/uL (ref 0–0.7)
Eosinophils Relative: 4 %
HCT: 37.8 % (ref 35.0–47.0)
Hemoglobin: 13.1 g/dL (ref 12.0–16.0)
Lymphocytes Relative: 21 %
Lymphs Abs: 1.7 10*3/uL (ref 1.0–3.6)
MCH: 32 pg (ref 26.0–34.0)
MCHC: 34.6 g/dL (ref 32.0–36.0)
MCV: 92.3 fL (ref 80.0–100.0)
Monocytes Absolute: 0.8 10*3/uL (ref 0.2–0.9)
Monocytes Relative: 11 %
Neutro Abs: 5 10*3/uL (ref 1.4–6.5)
Neutrophils Relative %: 62 %
Platelets: 222 10*3/uL (ref 150–440)
RBC: 4.1 MIL/uL (ref 3.80–5.20)
RDW: 13.2 % (ref 11.5–14.5)
WBC: 8 10*3/uL (ref 3.6–11.0)

## 2016-02-11 NOTE — Progress Notes (Signed)
[facility] day:  02/11/2016  Chief Complaint: Sara Robertson is a 82 y.o. female with iron deficiency anemia who is seen for 3 month assessment.  HPI: The patient was last seen in the medical oncology clinic on 11/12/2015.  At that time, she felt good.  She had issues with constipation (aggravated by iron) followed by explosive diarrhea.  Exam was unremarkable.  Hematocrit was 36.0.  MCV was normal (92).  Ferritin was 25.  Oral iron was decreased.  Labs on on 01/28/2016 included a hematocrit 36.1, hemoglobin 11.9, MCV 95, platelets 232,000, and white count 6800. Creatinine was 0.9 with a estimated GFR of 58 ml/min. Albumen was 3.4.   During the interim, she has felt "good". She notes no problems. She notes being a "little slower".     Past Medical History:  Diagnosis Date  . Anemia   . Angina pectoris ([place])   . CAD (coronary artery disease)   . GERD (gastroesophageal reflux disease)   . Headache   . Hyperlipemia   . Hypertension   . OSA (obstructive sleep apnea)   . SOB (shortness of breath)     History reviewed. No pertinent surgical history.  Family History  Problem Relation Age of Onset  . Cancer Mother   . Cancer Sister   . Cancer Brother     Social History:  reports that she has never smoked. She does not have any smokeless tobacco history on file. She reports that she does not drink alcohol. Her drug history is not on file.  The patient is accompanied by her daughter and son-in-law, Rexene Alberts, today.  Allergies:  Allergies  Allergen Reactions  . Celebrex [Celecoxib] Nausea And Vomiting  . Motrin [Ibuprofen] Nausea And Vomiting  . Nexium [Esomeprazole Magnesium] Nausea And Vomiting    Current Medications: Current Outpatient Prescriptions  Medication Sig Dispense Refill  . ampicillin (PRINCIPEN) 250 MG capsule Take 250 mg by mouth 1 day or 1 dose. Reported on 02/11/2016    . aspirin 81 MG tablet Take 81 mg by mouth  daily.    . bisoprolol-hydrochlorothiazide (ZIAC) 5-6.25 MG tablet Take 1 tablet by mouth daily.    . cyanocobalamin 500 MCG tablet Take 500 mcg by mouth daily.    . ferrous sulfate 325 (65 FE) MG tablet Take 325 mg by mouth daily with breakfast.     . furosemide (LASIX) 20 MG tablet Take 20 mg by mouth. 1/2 tab daily    . losartan (COZAAR) 50 MG tablet Take 50 mg by mouth daily.    . Multiple Vitamins-Minerals (MULTIVITAMIN WITH MINERALS) tablet Take 1 tablet by mouth daily.     No current facility-administered medications for this visit.    Facility-Administered Medications Ordered in Other Visits  Medication Dose Route Frequency Provider Last Rate Last Dose  . 0.9 %  sodium chloride infusion  250 mL Intravenous Once Lequita Asal, MD      . acetaminophen (TYLENOL) tablet 650 mg  650 mg Oral Once Lequita Asal, MD      . diphenhydrAMINE (BENADRYL) capsule 25 mg  25 mg Oral Once Lequita Asal, MD      . heparin lock flush 100 unit/mL  500 Units Intracatheter Daily PRN Lequita Asal, MD      . heparin lock flush 100 unit/mL  250 Units Intracatheter PRN Lequita Asal, MD      . sodium chloride 0.9 % injection 10 mL  10 mL Intracatheter PRN Lequita Asal, MD      . sodium chloride 0.9 % injection 3 mL  3 mL Intracatheter PRN Lequita Asal, MD        Review of Systems:  GENERAL:  Feels good.  "Little slower".  No fevers or sweats.  Weight up 2 pounds. PERFORMANCE STATUS (ECOG):  1 HEENT:  No visual changes, runny nose, sore throat, mouth sores or tenderness. Lungs: No shortness of breath or cough.  No hemoptysis. Cardiac:  No chest pain, palpitations, orthopnea, or PND. GI:  Reflux.  Irritable bowel.  Constipation followed by explosive diarrhea.  No nausea, vomiting,melena or hematochezia. GU:  No urgency, frequency, dysuria, or hematuria. Musculoskeletal:  No back pain.  No joint pain.  No muscle tenderness. Extremities:  No pain or swelling. Skin:  No  rashes or skin changes. Neuro:  No headache, numbness or weakness, balance or coordination issues. Endocrine:  No diabetes, thyroid issues, hot flashes or night sweats. Psych:  No mood changes, depression or anxiety. Pain:  No focal pain. Review of systems:  All other systems reviewed and found to be negative.  Physical Exam: Blood pressure 122/70, pulse 76, temperature 98.6 F (37 C), temperature source Tympanic, resp. rate 18, weight 184 lb 6.6 oz (83.7 kg). GENERAL:  Well developed, well nourished, woman sitting in the exam room in no acute distress.  She has a rolling walker at her side. MENTAL STATUS:  Alert and oriented to person, place and time. HEAD:  Curly white hair.  Normocephalic, atraumatic, face symmetric, no Cushingoid features. EYES:  Copper rimmed glasses.  Brown eyes.  Pupils equal round and reactive to light and accomodation.  No conjunctivitis or scleral icterus. ENT:  Oropharynx clear without lesion.  Tongue normal. Mucous membranes moist.  RESPIRATORY:  Clear to auscultation without rales, wheezes or rhonchi. CARDIOVASCULAR:  Regular rate and rhythm without murmur, rub or gallop. ABDOMEN:  Soft, non-tender, with active bowel sounds, and no appreciable hepatosplenomegaly.  No masses. SKIN:  Pale.  No rashes, ulcers or lesions. EXTREMITIES: No edema, no skin discoloration or tenderness.  No palpable cords. LYMPH NODES: No palpable cervical, supraclavicular, axillary or inguinal adenopathy  NEUROLOGICAL: Unremarkable. PSYCH:  Appropriate.   Appointment on 02/11/2016  Component Date Value Ref Range Status  . WBC 02/11/2016 8.0  3.6 - 11.0 K/uL Final  . RBC 02/11/2016 4.10  3.80 - 5.20 MIL/uL Final  . Hemoglobin 02/11/2016 13.1  12.0 - 16.0 g/dL Final  . HCT 02/11/2016 37.8  35.0 - 47.0 % Final  . MCV 02/11/2016 92.3  80.0 - 100.0 fL Final  . MCH 02/11/2016 32.0  26.0 - 34.0 pg Final  . MCHC 02/11/2016 34.6  32.0 - 36.0 g/dL Final  . RDW 02/11/2016 13.2  11.5 - 14.5  % Final  . Platelets 02/11/2016 222  150 - 440 K/uL Final  . Neutrophils Relative % 02/11/2016 62  % Final  . Neutro Abs 02/11/2016 5.0  1.4 - 6.5 K/uL Final  . Lymphocytes Relative 02/11/2016 21  % Final  . Lymphs Abs 02/11/2016 1.7  1.0 - 3.6 K/uL Final  . Monocytes Relative 02/11/2016 11  % Final  . Monocytes Absolute 02/11/2016 0.8  0.2 - 0.9 K/uL Final  . Eosinophils Relative 02/11/2016 4  % Final  . Eosinophils Absolute 02/11/2016 0.3  0 - 0.7 K/uL Final  . Basophils Relative 02/11/2016 2  % Final  . Basophils Absolute 02/11/2016 0.1  0 - 0.1 K/uL Final  .  Ferritin 02/11/2016 23  11 - 307 ng/mL Final    Assessment:  Sara Robertson is a 82 y.o. female with progressive iron deficiency anemia since 11/2014.  RBCs are microcytic.  She denies any melena or hematochezia.  She has irritable bowel.  Last colonoscopy was greater than 10 years ago. She has never had an EGD. Stools were guaiac negative on 07/07/2014.  Diet is modest.  She has a distant history of B12 deficiency, but has not been on B12 in some time.  Work-upon 06/29/2015 included a hematocrit of 23.8, hemoglobin 7.7, MCV 71.9, platelets 297,000, white count 7500 with a normal differential. Iron studies revealed iron deficiency anemia with a ferritin of 8, iron saturation 4%, and a TIBC of 471 (high).  Folate was 15.8 .  B12 was 286 (low normal).  MMA was 302 (normal) on 07/06/2015.  SPEP revealed no monoclonal protein. Kappa free light chain ratio was normal.  Guaiac cards x 3 were negative (06/29/2015 - 07/01/2015).  She was transfused with 1 unit of PRBCs on 06/30/2015 because of shortness of breath and chest discomfort.  She has been on oral iron.  Her diet is better (liver, beef, raisins, beets, spinach).  She declined EGD and colonoscopy.  Symptomatically, she feels good.  She has issues with constipation (aggravated by increasing doses of oral iron) followed by explosive diarrhea.  Exam is unremarkable.  Hematocrit is 37.8.   MCV is normal (92.3).  Ferritin is 23.  Plan: 1.  Labs today:  CBC with diff, ferritin. 2.  Continue ferrous sulfate 325 mg q day with OJ.   Goal ferritin 100. 3.  RTC in 6 months for MD assess and labs (CBC with diff, ferritin).    Lequita Asal, MD  02/11/2016, 10:43 AM

## 2016-02-11 NOTE — Telephone Encounter (Signed)
Notified patient of results below and voice understanding

## 2016-02-11 NOTE — Telephone Encounter (Signed)
Sent in basket message to dr sparks to ask if he would draw ferritin level in October when she comes into [facility].  I will await his response.  The patient does not like to get stuck twice in the same month for both doctors.

## 2016-02-11 NOTE — Telephone Encounter (Signed)
-----   Message from Lequita Asal, MD sent at 02/11/2016  1:21 PM EDT ----- Regarding: Please call patient  Please call patient.  Ferritin is about the same. We can continue 1 iron pill a day.  M  ----- Message -----    From: Lab In [place] Interface    Sent: 02/11/2016  11:33 AM      To: Lequita Asal, MD

## 2016-02-11 NOTE — Progress Notes (Signed)
Patient is here for a follow up, no complaints today

## 2016-08-11 ENCOUNTER — Inpatient Hospital Stay: Payer: Medicare Other

## 2016-08-11 ENCOUNTER — Inpatient Hospital Stay: Payer: Medicare Other | Admitting: Hematology and Oncology

## 2016-08-15 ENCOUNTER — Other Ambulatory Visit: Payer: Self-pay | Admitting: *Deleted

## 2016-08-15 DIAGNOSIS — D508 Other iron deficiency anemias: Secondary | ICD-10-CM

## 2016-08-17 NOTE — Progress Notes (Signed)
[facility] day:  08/18/2016   Chief Complaint: Sara Robertson is a 83 y.o. female with iron deficiency anemia who is seen for 6 month assessment.  HPI: The patient was last seen in thehematology clinic on 02/11/2016.  At that time, she felt good.  She had issues with constipation (aggravated by increasing doses of oral iron) followed by explosive diarrhea.  Hematocrit was 37.8 with an MCV of 92.3.  Ferritin was 23.  She was to continue 1 iron pill a day.  She was diagnosed with bronchitis in 06/2016.  She describes "the whole month wasted by being sick".  She was in bed for 3 days.  She was treated with 2 rounds of antibiotics.   She states that she eats what she wants, when she wants.  She is taking 1 iron pill a day.  She denies any melena, hematochezia, hematuria or vaginal bleeding.    Past Medical History:  Diagnosis Date  . Anemia   . Angina pectoris ([place])   . CAD (coronary artery disease)   . GERD (gastroesophageal reflux disease)   . Headache   . Hyperlipemia   . Hypertension   . OSA (obstructive sleep apnea)   . SOB (shortness of breath)     History reviewed. No pertinent surgical history.  Family History  Problem Relation Age of Onset  . Cancer Mother   . Cancer Sister   . Cancer Brother     Social History:  reports that she has never smoked. She does not have any smokeless tobacco history on file. She reports that she does not drink alcohol. Her drug history is not on file.  She lives alone in [place].  The patient is accompanied by her daughter today.  Allergies:  Allergies  Allergen Reactions  . Celebrex [Celecoxib] Nausea And Vomiting  . Motrin [Ibuprofen] Nausea And Vomiting  . Nexium [Esomeprazole Magnesium] Nausea And Vomiting    Current Medications: Current Outpatient Prescriptions  Medication Sig Dispense Refill  . aspirin 81 MG tablet Take 81 mg by mouth daily.    . bisoprolol-hydrochlorothiazide  (ZIAC) 5-6.25 MG tablet Take 1 tablet by mouth daily.    . cyanocobalamin 500 MCG tablet Take 500 mcg by mouth daily.    . ferrous sulfate 325 (65 FE) MG tablet Take 325 mg by mouth daily with breakfast.     . furosemide (LASIX) 20 MG tablet Take 20 mg by mouth. 1/2 tab daily    . losartan (COZAAR) 50 MG tablet Take 50 mg by mouth daily.    . Multiple Vitamins-Minerals (MULTIVITAMIN WITH MINERALS) tablet Take 1 tablet by mouth daily.    Marland Kitchen ampicillin (PRINCIPEN) 250 MG capsule Take 250 mg by mouth 1 day or 1 dose. Reported on 02/11/2016     No current facility-administered medications for this visit.    Facility-Administered Medications Ordered in Other Visits  Medication Dose Route Frequency Provider Last Rate Last Dose  . 0.9 %  sodium chloride infusion  250 mL Intravenous Once Lequita Asal, MD      . acetaminophen (TYLENOL) tablet 650 mg  650 mg Oral Once Lequita Asal, MD      . diphenhydrAMINE (BENADRYL) capsule 25 mg  25 mg Oral Once Lequita Asal, MD      . heparin lock flush 100 unit/mL  500 Units Intracatheter Daily PRN Lequita Asal, MD      . heparin lock flush 100 unit/mL  250  Units Intracatheter PRN Lequita Asal, MD      . sodium chloride 0.9 % injection 10 mL  10 mL Intracatheter PRN Lequita Asal, MD      . sodium chloride 0.9 % injection 3 mL  3 mL Intracatheter PRN Lequita Asal, MD        Review of Systems:  GENERAL:  Feels "ok".  No fevers or sweats.  Weight down 1 pound. PERFORMANCE STATUS (ECOG):  1 HEENT:  No visual changes, runny nose, sore throat, mouth sores or tenderness. Lungs: No shortness of breath or cough.  No hemoptysis.  Interval bronchitis. Cardiac:  No chest pain, palpitations, orthopnea, or PND. GI:  Reflux.  Irritable bowel.  Constipation and diarrhea.  No nausea, vomiting,melena or hematochezia. GU:  No urgency, frequency, dysuria, or hematuria. Musculoskeletal:  No back pain.  No joint pain.  No muscle  tenderness. Extremities:  No pain or swelling. Skin:  No rashes or skin changes. Neuro:  No headache, numbness or weakness, balance or coordination issues. Endocrine:  No diabetes, thyroid issues, hot flashes or night sweats. Psych:  No mood changes, depression or anxiety. Pain:  No focal pain. Review of systems:  All other systems reviewed and found to be negative.  Physical Exam: Blood pressure 129/68, pulse 84, temperature (!) 95.6 F (35.3 C), temperature source Tympanic, resp. rate 18, weight 183 lb 13.8 oz (83.4 kg). GENERAL:  Well developed, well nourished, elderly woman sitting in the exam room in no acute distress.  She has a rolling walker at her side. MENTAL STATUS:  Alert and oriented to person, place and time. HEAD:  Curly white hair.  Normocephalic, atraumatic, face symmetric, no Cushingoid features. EYES:  Copper rimmed glasses.  Brown eyes.  Pupils equal round and reactive to light and accomodation.  No conjunctivitis or scleral icterus. ENT:  Oropharynx clear without lesion.  Tongue normal. Mucous membranes moist.  RESPIRATORY:  Clear to auscultation without rales, wheezes or rhonchi. CARDIOVASCULAR:  Regular rate and rhythm without murmur, rub or gallop. ABDOMEN:  Soft, non-tender, with active bowel sounds, and no appreciable hepatosplenomegaly.  No masses. SKIN:  No rashes, ulcers or lesions. EXTREMITIES: No edema, no skin discoloration or tenderness.  No palpable cords. LYMPH NODES: No palpable cervical, supraclavicular, axillary or inguinal adenopathy  NEUROLOGICAL: Unremarkable. PSYCH:  Appropriate.   Appointment on 08/18/2016  Component Date Value Ref Range Status  . WBC 08/18/2016 6.9  3.6 - 11.0 K/uL Final  . RBC 08/18/2016 3.80  3.80 - 5.20 MIL/uL Final  . Hemoglobin 08/18/2016 11.9* 12.0 - 16.0 g/dL Final  . HCT 08/18/2016 35.2  35.0 - 47.0 % Final  . MCV 08/18/2016 92.5  80.0 - 100.0 fL Final  . MCH 08/18/2016 31.3  26.0 - 34.0 pg Final  . MCHC  08/18/2016 33.8  32.0 - 36.0 g/dL Final  . RDW 08/18/2016 14.3  11.5 - 14.5 % Final  . Platelets 08/18/2016 248  150 - 440 K/uL Final    Assessment:  Sara Robertson is a 83 y.o. female with progressive iron deficiency anemia since 11/2014.  RBCs are microcytic.  She denies any melena or hematochezia.  She has irritable bowel.  Last colonoscopy was greater than 10 years ago. She has never had an EGD. Stools were guaiac negative on 07/07/2014.  Diet is modest.  She has a distant history of B12 deficiency, but has not been on B12 in some time.  Work-upon 06/29/2015 included a hematocrit of 23.8, hemoglobin 7.7,  MCV 71.9, platelets 297,000, white count 7500 with a normal differential. Iron studies revealed iron deficiency anemia with a ferritin of 8, iron saturation 4%, and a TIBC of 471 (high).  Folate was 15.8 .  B12 was 286 (low normal).  MMA was 302 (normal) on 07/06/2015.  SPEP revealed no monoclonal protein. Kappa free light chain ratio was normal.  Guaiac cards x 3 were negative (06/29/2015 - 07/01/2015).  She was transfused with 1 unit of PRBCs on 06/30/2015 because of shortness of breath and chest discomfort.  She has been on oral iron.  Her diet is better (liver, beef, raisins, beets, spinach).  She declined EGD and colonoscopy.  Symptomatically, she feels good.  Exam is unremarkable.  Hematocrit is 35.2.  MCV is normal (92.3).  Ferritin is 33.  Plan: 1.  Labs today:  CBC with diff, ferritin. 2.  Continue ferrous sulfate 325 mg q day with OJ.   Goal ferritin 100. 3.  Nurse to call patient with lab results 4.  RTC prn.    Lequita Asal, MD  08/18/2016, 10:35 AM

## 2016-08-18 ENCOUNTER — Encounter: Payer: Self-pay | Admitting: Hematology and Oncology

## 2016-08-18 ENCOUNTER — Inpatient Hospital Stay: Payer: Medicare Other | Attending: Hematology and Oncology

## 2016-08-18 ENCOUNTER — Inpatient Hospital Stay (HOSPITAL_BASED_OUTPATIENT_CLINIC_OR_DEPARTMENT_OTHER): Payer: Medicare Other | Admitting: Hematology and Oncology

## 2016-08-18 VITALS — BP 129/68 | HR 84 | Temp 95.6°F | Resp 18 | Wt 183.9 lb

## 2016-08-18 DIAGNOSIS — K580 Irritable bowel syndrome with diarrhea: Secondary | ICD-10-CM | POA: Diagnosis not present

## 2016-08-18 DIAGNOSIS — D508 Other iron deficiency anemias: Secondary | ICD-10-CM

## 2016-08-18 DIAGNOSIS — Z79899 Other long term (current) drug therapy: Secondary | ICD-10-CM | POA: Insufficient documentation

## 2016-08-18 DIAGNOSIS — Z7982 Long term (current) use of aspirin: Secondary | ICD-10-CM | POA: Diagnosis not present

## 2016-08-18 DIAGNOSIS — K219 Gastro-esophageal reflux disease without esophagitis: Secondary | ICD-10-CM | POA: Insufficient documentation

## 2016-08-18 DIAGNOSIS — E538 Deficiency of other specified B group vitamins: Secondary | ICD-10-CM | POA: Diagnosis not present

## 2016-08-18 DIAGNOSIS — E785 Hyperlipidemia, unspecified: Secondary | ICD-10-CM

## 2016-08-18 DIAGNOSIS — D509 Iron deficiency anemia, unspecified: Secondary | ICD-10-CM | POA: Insufficient documentation

## 2016-08-18 DIAGNOSIS — I10 Essential (primary) hypertension: Secondary | ICD-10-CM

## 2016-08-18 DIAGNOSIS — I2510 Atherosclerotic heart disease of native coronary artery without angina pectoris: Secondary | ICD-10-CM | POA: Insufficient documentation

## 2016-08-18 DIAGNOSIS — G4733 Obstructive sleep apnea (adult) (pediatric): Secondary | ICD-10-CM

## 2016-08-18 LAB — CBC
HCT: 35.2 % (ref 35.0–47.0)
Hemoglobin: 11.9 g/dL — ABNORMAL LOW (ref 12.0–16.0)
MCH: 31.3 pg (ref 26.0–34.0)
MCHC: 33.8 g/dL (ref 32.0–36.0)
MCV: 92.5 fL (ref 80.0–100.0)
Platelets: 248 10*3/uL (ref 150–440)
RBC: 3.8 MIL/uL (ref 3.80–5.20)
RDW: 14.3 % (ref 11.5–14.5)
WBC: 6.9 10*3/uL (ref 3.6–11.0)

## 2016-08-18 LAB — FERRITIN: Ferritin: 33 ng/mL (ref 11–307)

## 2016-08-18 NOTE — Progress Notes (Signed)
Patient offers no complaints today. 

## 2016-08-20 ENCOUNTER — Telehealth: Payer: Self-pay | Admitting: *Deleted

## 2016-08-20 NOTE — Telephone Encounter (Signed)
Called and left message for patient r/t lab results.

## 2016-08-25 ENCOUNTER — Other Ambulatory Visit: Payer: Medicare Other

## 2016-08-25 ENCOUNTER — Ambulatory Visit: Payer: Medicare Other | Admitting: Hematology and Oncology

## 2016-09-28 ENCOUNTER — Encounter: Payer: Self-pay | Admitting: Hematology and Oncology

## 2016-11-30 ENCOUNTER — Emergency Department
Admission: EM | Admit: 2016-11-30 | Discharge: 2016-11-30 | Disposition: A | Discharge status: Home / Self Care | Payer: Medicare Other | Attending: Emergency Medicine | Admitting: Emergency Medicine

## 2016-11-30 ENCOUNTER — Encounter: Payer: Self-pay | Admitting: Emergency Medicine

## 2016-11-30 ENCOUNTER — Emergency Department: Payer: Medicare Other

## 2016-11-30 DIAGNOSIS — I2510 Atherosclerotic heart disease of native coronary artery without angina pectoris: Secondary | ICD-10-CM | POA: Diagnosis not present

## 2016-11-30 DIAGNOSIS — S8991XA Unspecified injury of right lower leg, initial encounter: Secondary | ICD-10-CM | POA: Diagnosis present

## 2016-11-30 DIAGNOSIS — Y999 Unspecified external cause status: Secondary | ICD-10-CM | POA: Diagnosis not present

## 2016-11-30 DIAGNOSIS — Y939 Activity, unspecified: Secondary | ICD-10-CM | POA: Diagnosis not present

## 2016-11-30 DIAGNOSIS — I10 Essential (primary) hypertension: Secondary | ICD-10-CM | POA: Diagnosis not present

## 2016-11-30 DIAGNOSIS — W1839XA Other fall on same level, initial encounter: Secondary | ICD-10-CM | POA: Insufficient documentation

## 2016-11-30 DIAGNOSIS — Z79899 Other long term (current) drug therapy: Secondary | ICD-10-CM | POA: Diagnosis not present

## 2016-11-30 DIAGNOSIS — M25561 Pain in right knee: Secondary | ICD-10-CM | POA: Insufficient documentation

## 2016-11-30 DIAGNOSIS — Y929 Unspecified place or not applicable: Secondary | ICD-10-CM | POA: Insufficient documentation

## 2016-11-30 MED ORDER — TETANUS-DIPHTH-ACELL PERTUSSIS 5-2.5-18.5 LF-MCG/0.5 IM SUSP
0.5000 mL | Freq: Once | INTRAMUSCULAR | Status: AC
  Administered 2016-11-30: 0.5 mL via INTRAMUSCULAR
  Filled 2016-11-30: qty 0.5

## 2016-11-30 NOTE — ED Triage Notes (Signed)
Pt to ED by EMS from [facility] after 2 falls within the last 2 days. Pt states her right knee is "giving out". Pt has no c/o of pain except with weight bearing.  f

## 2016-11-30 NOTE — ED Notes (Signed)
Pt placed on monitor. EKG done

## 2016-11-30 NOTE — ED Provider Notes (Addendum)
[facility] Emergency Department Provider Note ____________________________________________   I have reviewed the triage vital signs and the triage nursing note.  HISTORY  Chief Complaint Fall and Knee Pain   Historian Patient and daughter  HPI Sara Robertson is a 83 y.o. female who recently moved into independent living at home place, typically walks around fairly mobile on her own with a walker, fell yesterday and is still having pain in her right knee.  Located anterior knee joint margin. She does have deformities consistent with arthritis, but without new joint swelling.  Daughter saw her fall and there was no head injury. The patient denies any neck injury chest injury or other extremity injury other than a small skin tear with bruise to the left upper arm. No bony tenderness to the left upper extremity.    Past Medical History:  Diagnosis Date  . Anemia   . Angina pectoris ([place])   . CAD (coronary artery disease)   . GERD (gastroesophageal reflux disease)   . Headache   . Hyperlipemia   . Hypertension   . OSA (obstructive sleep apnea)   . SOB (shortness of breath)     Patient Active Problem List   Diagnosis Date Noted  . Iron deficiency anemia 06/29/2015    History reviewed. No pertinent surgical history.  Prior to Admission medications   Medication Sig Start Date End Date Taking? Authorizing Provider  aspirin 81 MG tablet Take 81 mg by mouth daily.   Yes [provider]  bisoprolol-hydrochlorothiazide (ZIAC) 5-6.25 MG tablet Take 1 tablet by mouth daily.   Yes [provider]  cyanocobalamin 500 MCG tablet Take 500 mcg by mouth daily.   Yes [provider]  ferrous sulfate 325 (65 FE) MG tablet Take 325 mg by mouth daily with breakfast.    Yes [provider]  furosemide (LASIX) 40 MG tablet Take 40 mg by mouth daily. 11/17/16  Yes [provider]  losartan (COZAAR) 50 MG tablet Take 50 mg by  mouth daily.   Yes [provider]  Multiple Vitamins-Minerals (MULTIVITAMIN WITH MINERALS) tablet Take 1 tablet by mouth daily.   Yes [provider]  omeprazole (PRILOSEC) 40 MG capsule Take 40 mg by mouth daily. 11/17/16  Yes [provider]  ampicillin (PRINCIPEN) 250 MG capsule Take 250 mg by mouth 1 day or 1 dose. Reported on 02/11/2016    [provider]    Allergies  Allergen Reactions  . Celebrex [Celecoxib] Nausea And Vomiting  . Motrin [Ibuprofen] Nausea And Vomiting  . Nexium [Esomeprazole Magnesium] Nausea And Vomiting    Family History  Problem Relation Age of Onset  . Cancer Mother   . Cancer Sister   . Cancer Brother     Social History Social History  Substance Use Topics  . Smoking status: Never Smoker  . Smokeless tobacco: Never Used  . Alcohol use No    Review of Systems  Constitutional: Negative for fever. Eyes: Negative for visual changes. ENT: Negative for sore throat. Cardiovascular: Negative for chest pain. Respiratory: Negative for shortness of breath. Gastrointestinal: Negative for abdominal pain, vomiting and diarrhea. Genitourinary: Negative for dysuria. Musculoskeletal: Negative for back pain. Skin: Negative for rash. Neurological: Negative for headache. 10 point Review of Systems otherwise negative ____________________________________________   PHYSICAL EXAM:  VITAL SIGNS: ED Triage Vitals  Enc Vitals Group     BP 11/30/16 1157 (!) 157/89     Pulse Rate 11/30/16 1157 76  Resp 11/30/16 1157 (!) 22     Temp 11/30/16 1158 98.6 F (37 C)     Temp src --      SpO2 11/30/16 1157 99 %     Weight 11/30/16 1158 185 lb (83.9 kg)     Height 11/30/16 1157 5' (1.524 m)     Head Circumference --      Peak Flow --      Pain Score 11/30/16 1156 0     Pain Loc --      Pain Edu? --      Excl. in [place]? --      Constitutional: Alert and oriented. Well appearing and in no distress. HEENT   Head:  Normocephalic and atraumatic.      Eyes: Conjunctivae are normal. PERRL. Normal extraocular movements.      Ears:         Nose: No congestion/rhinnorhea.   Mouth/Throat: Mucous membranes are moist.   Neck: No stridor.  No c spine tenderness Cardiovascular/Chest: Normal rate, regular rhythm.  No murmurs, rubs, or gallops. Respiratory: Normal respiratory effort without tachypnea nor retractions. Breath sounds are clear and equal bilaterally. No wheezes/rales/rhonchi. Gastrointestinal: Soft. No distention, no guarding, no rebound. Nontender.    Genitourinary/rectal:Deferred Musculoskeletal: Stable pelvis. Both knees have chronic arthritis deformities, but no joint effusion or redness or bruising. Tenderness at the anterior joint margin on the right knee. Neurologic:  Normal speech and language. No gross or focal neurologic deficits are appreciated. Skin:  Skin is warm, dry.  Early ecchymosis and tiny skin tear to the left upper extremity. Psychiatric: Mood and affect are normal. Speech and behavior are normal. Patient exhibits appropriate insight and judgment.   ____________________________________________  LABS (pertinent positives/negatives)  Labs Reviewed - No data to display  ____________________________________________    EKG I, Lisa Roca, MD, the attending physician have personally viewed and interpreted all ECGs.  75 bpm. Normal sinus rhythm. Narrow QRS. Left axis deviation. Nonspecific T-wave ____________________________________________  RADIOLOGY All Xrays were viewed by me. Imaging interpreted by Radiologist.  Right knee:  IMPRESSION: 1. Tricompartment osteoarthritis with chondrocalcinosis. 2. Small joint effusion. __________________________________________  PROCEDURES  Procedure(s) performed: None  Critical Care performed: None  ____________________________________________   ED COURSE / ASSESSMENT AND PLAN  Pertinent labs & imaging results that were  available during my care of the patient were reviewed by me and considered in my medical decision making (see chart for details).    Fall yesterday with right knee pain, x-ray negative for bony fracture.  They do have a wheelchair at home. I am going to have her try a knee immobilizer and conservative management for about one week. I have asked her to speak with her primary care doctor about possible PT to maintain strength while she is recovering from this.  There is no other traumatic injuries by history or on exam. Sounds like this was a mechanical fall, no additional historical features or findings on exam to initiate medical workup in terms of blood work or urine at this point time.    CONSULTATIONS:   None   Patient / Family / Caregiver informed of clinical course, medical decision-making process, and agree with plan.   I discussed return precautions, follow-up instructions, and discharge instructions with patient and/or family.  Discharge instructions:  Your x-ray was reassuring for no fracture in the emergency department today. As we discussed, you may try over-the-counter Tylenol and/or ibuprofen as needed for discomfort for about one week. If the pain is  persistent after that time period, you may need further investigation such as an MRI to look for additional ligament injury.  Please discussed with her primary care doctor as soon as possible regarding possible physical therapy to make sure that you don't get weak in the interim while you are healing.  Return to the emergency department immediately for any worsening swelling, redness, pain, or any other symptoms concerning to you.  ___________________________________________   FINAL CLINICAL IMPRESSION(S) / ED DIAGNOSES   Final diagnoses:  Acute pain of right knee              Note: This dictation was prepared with Dragon dictation. Any transcriptional errors that result from this process are unintentional     Lisa Roca, MD 11/30/16 [place]    Lisa Roca, MD 11/30/16 1452

## 2016-11-30 NOTE — ED Notes (Signed)
Lord MD at bedside.

## 2016-11-30 NOTE — Discharge Instructions (Signed)
Your x-ray was reassuring for no fracture in the emergency department today. As we discussed, you may try over-the-counter Tylenol and/or ibuprofen as needed for discomfort for about one week. If the pain is persistent after that time period, you may need further investigation such as an MRI to look for additional ligament injury.  Please discussed with her primary care doctor as soon as possible regarding possible physical therapy to make sure that you don't get weak in the interim while you are healing.  Return to the emergency department immediately for any worsening swelling, redness, pain, or any other symptoms concerning to you.

## 2017-06-28 ENCOUNTER — Emergency Department (HOSPITAL_COMMUNITY): Payer: Medicare Other

## 2017-06-28 ENCOUNTER — Encounter (HOSPITAL_COMMUNITY): Payer: Self-pay | Admitting: *Deleted

## 2017-06-28 ENCOUNTER — Other Ambulatory Visit: Payer: Self-pay

## 2017-06-28 ENCOUNTER — Observation Stay (HOSPITAL_COMMUNITY): Payer: Medicare Other

## 2017-06-28 ENCOUNTER — Inpatient Hospital Stay (HOSPITAL_COMMUNITY)
Admission: EM | Admit: 2017-06-28 | Discharge: 2017-06-30 | DRG: 065 | Disposition: A | Discharge status: Rehabilitation Facility | Payer: Medicare Other | Attending: Internal Medicine | Admitting: Internal Medicine

## 2017-06-28 DIAGNOSIS — I639 Cerebral infarction, unspecified: Secondary | ICD-10-CM | POA: Diagnosis not present

## 2017-06-28 DIAGNOSIS — Z9181 History of falling: Secondary | ICD-10-CM

## 2017-06-28 DIAGNOSIS — Z87440 Personal history of urinary (tract) infections: Secondary | ICD-10-CM

## 2017-06-28 DIAGNOSIS — Z886 Allergy status to analgesic agent status: Secondary | ICD-10-CM

## 2017-06-28 DIAGNOSIS — E785 Hyperlipidemia, unspecified: Secondary | ICD-10-CM | POA: Diagnosis present

## 2017-06-28 DIAGNOSIS — E162 Hypoglycemia, unspecified: Secondary | ICD-10-CM | POA: Diagnosis present

## 2017-06-28 DIAGNOSIS — R29705 NIHSS score 5: Secondary | ICD-10-CM | POA: Diagnosis not present

## 2017-06-28 DIAGNOSIS — R270 Ataxia, unspecified: Secondary | ICD-10-CM | POA: Diagnosis present

## 2017-06-28 DIAGNOSIS — R590 Localized enlarged lymph nodes: Secondary | ICD-10-CM | POA: Diagnosis present

## 2017-06-28 DIAGNOSIS — R29810 Facial weakness: Secondary | ICD-10-CM | POA: Diagnosis not present

## 2017-06-28 DIAGNOSIS — Z6832 Body mass index (BMI) 32.0-32.9, adult: Secondary | ICD-10-CM

## 2017-06-28 DIAGNOSIS — D649 Anemia, unspecified: Secondary | ICD-10-CM | POA: Diagnosis present

## 2017-06-28 DIAGNOSIS — N390 Urinary tract infection, site not specified: Secondary | ICD-10-CM | POA: Diagnosis present

## 2017-06-28 DIAGNOSIS — I63512 Cerebral infarction due to unspecified occlusion or stenosis of left middle cerebral artery: Principal | ICD-10-CM | POA: Diagnosis present

## 2017-06-28 DIAGNOSIS — I5189 Other ill-defined heart diseases: Secondary | ICD-10-CM

## 2017-06-28 DIAGNOSIS — K219 Gastro-esophageal reflux disease without esophagitis: Secondary | ICD-10-CM | POA: Diagnosis present

## 2017-06-28 DIAGNOSIS — Z90710 Acquired absence of both cervix and uterus: Secondary | ICD-10-CM

## 2017-06-28 DIAGNOSIS — R471 Dysarthria and anarthria: Secondary | ICD-10-CM | POA: Diagnosis present

## 2017-06-28 DIAGNOSIS — Z79899 Other long term (current) drug therapy: Secondary | ICD-10-CM

## 2017-06-28 DIAGNOSIS — I480 Paroxysmal atrial fibrillation: Secondary | ICD-10-CM | POA: Diagnosis present

## 2017-06-28 DIAGNOSIS — D62 Acute posthemorrhagic anemia: Secondary | ICD-10-CM

## 2017-06-28 DIAGNOSIS — E871 Hypo-osmolality and hyponatremia: Secondary | ICD-10-CM | POA: Diagnosis present

## 2017-06-28 DIAGNOSIS — Z66 Do not resuscitate: Secondary | ICD-10-CM | POA: Diagnosis present

## 2017-06-28 DIAGNOSIS — E669 Obesity, unspecified: Secondary | ICD-10-CM | POA: Diagnosis present

## 2017-06-28 DIAGNOSIS — R29702 NIHSS score 2: Secondary | ICD-10-CM | POA: Diagnosis present

## 2017-06-28 DIAGNOSIS — R1310 Dysphagia, unspecified: Secondary | ICD-10-CM | POA: Diagnosis present

## 2017-06-28 DIAGNOSIS — I10 Essential (primary) hypertension: Secondary | ICD-10-CM | POA: Diagnosis not present

## 2017-06-28 DIAGNOSIS — Z888 Allergy status to other drugs, medicaments and biological substances status: Secondary | ICD-10-CM

## 2017-06-28 DIAGNOSIS — G4733 Obstructive sleep apnea (adult) (pediatric): Secondary | ICD-10-CM | POA: Diagnosis present

## 2017-06-28 DIAGNOSIS — I2721 Secondary pulmonary arterial hypertension: Secondary | ICD-10-CM | POA: Diagnosis present

## 2017-06-28 DIAGNOSIS — I2510 Atherosclerotic heart disease of native coronary artery without angina pectoris: Secondary | ICD-10-CM

## 2017-06-28 HISTORY — DX: Polyneuropathy, unspecified: G62.9

## 2017-06-28 HISTORY — DX: Personal history of other endocrine, nutritional and metabolic disease: Z86.39

## 2017-06-28 HISTORY — DX: Diverticulosis of intestine, part unspecified, without perforation or abscess without bleeding: K57.90

## 2017-06-28 HISTORY — DX: Personal history of other diseases of the nervous system and sense organs: Z86.69

## 2017-06-28 HISTORY — DX: Pulmonary hypertension, unspecified: I27.20

## 2017-06-28 LAB — URINALYSIS, ROUTINE W REFLEX MICROSCOPIC
Bilirubin Urine: NEGATIVE
Glucose, UA: NEGATIVE mg/dL
Hgb urine dipstick: NEGATIVE
Ketones, ur: NEGATIVE mg/dL
LEUKOCYTES UA: NEGATIVE
Nitrite: NEGATIVE
PROTEIN: NEGATIVE mg/dL
SPECIFIC GRAVITY, URINE: 1.027 (ref 1.005–1.030)
pH: 6 (ref 5.0–8.0)

## 2017-06-28 LAB — I-STAT CHEM 8, ED
BUN: 15 mg/dL (ref 6–20)
CALCIUM ION: 1.18 mmol/L (ref 1.15–1.40)
CREATININE: 0.8 mg/dL (ref 0.44–1.00)
Chloride: 102 mmol/L (ref 101–111)
Glucose, Bld: 140 mg/dL — ABNORMAL HIGH (ref 65–99)
HCT: 34 % — ABNORMAL LOW (ref 36.0–46.0)
HEMOGLOBIN: 11.6 g/dL — AB (ref 12.0–15.0)
POTASSIUM: 3.9 mmol/L (ref 3.5–5.1)
Sodium: 138 mmol/L (ref 135–145)
TCO2: 24 mmol/L (ref 22–32)

## 2017-06-28 LAB — COMPREHENSIVE METABOLIC PANEL
ALT: 27 U/L (ref 14–54)
AST: 28 U/L (ref 15–41)
Albumin: 3.2 g/dL — ABNORMAL LOW (ref 3.5–5.0)
Alkaline Phosphatase: 61 U/L (ref 38–126)
Anion gap: 8 (ref 5–15)
BILIRUBIN TOTAL: 0.8 mg/dL (ref 0.3–1.2)
BUN: 15 mg/dL (ref 6–20)
CHLORIDE: 104 mmol/L (ref 101–111)
CO2: 23 mmol/L (ref 22–32)
CREATININE: 0.83 mg/dL (ref 0.44–1.00)
Calcium: 8.9 mg/dL (ref 8.9–10.3)
GFR, EST NON AFRICAN AMERICAN: 57 mL/min — AB (ref >60)
Glucose, Bld: 137 mg/dL — ABNORMAL HIGH (ref 65–99)
POTASSIUM: 3.8 mmol/L (ref 3.5–5.1)
Sodium: 135 mmol/L (ref 135–145)
TOTAL PROTEIN: 6.2 g/dL — AB (ref 6.5–8.1)

## 2017-06-28 LAB — CBC
HCT: 34.8 % — ABNORMAL LOW (ref 36.0–46.0)
HEMATOCRIT: 34.3 % — AB (ref 36.0–46.0)
HEMOGLOBIN: 11.4 g/dL — AB (ref 12.0–15.0)
Hemoglobin: 11.4 g/dL — ABNORMAL LOW (ref 12.0–15.0)
MCH: 31.2 pg (ref 26.0–34.0)
MCH: 30.8 pg (ref 26.0–34.0)
MCHC: 33.2 g/dL (ref 30.0–36.0)
MCHC: 32.8 g/dL (ref 30.0–36.0)
MCV: 94 fL (ref 78.0–100.0)
MCV: 94.1 fL (ref 78.0–100.0)
Platelets: 199 10*3/uL (ref 150–400)
Platelets: 192 10*3/uL (ref 150–400)
RBC: 3.65 MIL/uL — ABNORMAL LOW (ref 3.87–5.11)
RBC: 3.7 MIL/uL — ABNORMAL LOW (ref 3.87–5.11)
RDW: 13.3 % (ref 11.5–15.5)
RDW: 13.2 % (ref 11.5–15.5)
WBC: 7.5 10*3/uL (ref 4.0–10.5)
WBC: 8.4 10*3/uL (ref 4.0–10.5)

## 2017-06-28 LAB — MAGNESIUM: Magnesium: 1.9 mg/dL (ref 1.7–2.4)

## 2017-06-28 LAB — DIFFERENTIAL
BASOS ABS: 0 10*3/uL (ref 0.0–0.1)
BASOS PCT: 0 %
EOS ABS: 0.3 10*3/uL (ref 0.0–0.7)
Eosinophils Relative: 4 %
LYMPHS ABS: 1.1 10*3/uL (ref 0.7–4.0)
Lymphocytes Relative: 14 %
MONO ABS: 1.2 10*3/uL — AB (ref 0.1–1.0)
MONOS PCT: 16 %
Neutro Abs: 4.9 10*3/uL (ref 1.7–7.7)
Neutrophils Relative %: 66 %

## 2017-06-28 LAB — CREATININE, SERUM
Creatinine, Ser: 0.76 mg/dL (ref 0.44–1.00)
GFR calc Af Amer: >60 mL/min (ref >60)
GFR calc non Af Amer: >60 mL/min (ref >60)

## 2017-06-28 LAB — ETHANOL

## 2017-06-28 LAB — LIPID PANEL
Cholesterol: 202 mg/dL — ABNORMAL HIGH (ref 0–200)
HDL: 45 mg/dL (ref >40)
LDL Cholesterol: 143 mg/dL — ABNORMAL HIGH (ref 0–99)
Total CHOL/HDL Ratio: 4.5 RATIO
Triglycerides: 68 mg/dL (ref <150)
VLDL: 14 mg/dL (ref 0–40)

## 2017-06-28 LAB — I-STAT TROPONIN, ED: TROPONIN I, POC: 0.07 ng/mL (ref 0.00–0.08)

## 2017-06-28 LAB — PHOSPHORUS: Phosphorus: 3.2 mg/dL (ref 2.5–4.6)

## 2017-06-28 LAB — RAPID URINE DRUG SCREEN, HOSP PERFORMED
Amphetamines: NOT DETECTED
BARBITURATES: NOT DETECTED
Benzodiazepines: NOT DETECTED
COCAINE: NOT DETECTED
Opiates: NOT DETECTED
Tetrahydrocannabinol: NOT DETECTED

## 2017-06-28 LAB — APTT: aPTT: 32 seconds (ref 24–36)

## 2017-06-28 LAB — PROTIME-INR
INR: 1.03
Prothrombin Time: 13.4 seconds (ref 11.4–15.2)

## 2017-06-28 MED ORDER — ENOXAPARIN SODIUM 40 MG/0.4ML ~~LOC~~ SOLN
40.0000 mg | SUBCUTANEOUS | Status: DC
  Filled 2017-06-28: qty 0.4

## 2017-06-28 MED ORDER — ONDANSETRON HCL 4 MG/2ML IJ SOLN
4.0000 mg | Freq: Once | INTRAMUSCULAR | Status: AC
  Administered 2017-06-28: 4 mg via INTRAVENOUS
  Filled 2017-06-28: qty 2

## 2017-06-28 MED ORDER — ASPIRIN 325 MG PO TABS
325.0000 mg | ORAL_TABLET | Freq: Every day | ORAL | Status: DC
  Filled 2017-06-28: qty 1

## 2017-06-28 MED ORDER — LOSARTAN POTASSIUM 50 MG PO TABS
50.0000 mg | ORAL_TABLET | Freq: Every day | ORAL | Status: DC
  Administered 2017-06-29 – 2017-06-30 (×2): 50 mg via ORAL
  Filled 2017-06-28 (×2): qty 1

## 2017-06-28 MED ORDER — VITAMIN B-12 100 MCG PO TABS
500.0000 ug | ORAL_TABLET | Freq: Every day | ORAL | Status: DC
  Administered 2017-06-29 – 2017-06-30 (×2): 500 ug via ORAL
  Filled 2017-06-28 (×2): qty 5

## 2017-06-28 MED ORDER — SENNOSIDES-DOCUSATE SODIUM 8.6-50 MG PO TABS
1.0000 | ORAL_TABLET | Freq: Two times a day (BID) | ORAL | Status: DC
  Administered 2017-06-28 – 2017-06-29 (×3): 1 via ORAL
  Filled 2017-06-28 (×4): qty 1

## 2017-06-28 MED ORDER — ATORVASTATIN CALCIUM 40 MG PO TABS: 40.0000 mg | ORAL_TABLET | Freq: Every day | ORAL | Status: DC

## 2017-06-28 MED ORDER — IOPAMIDOL (ISOVUE-370) INJECTION 76%
INTRAVENOUS | Status: AC
  Administered 2017-06-28: 50 mL
  Filled 2017-06-28: qty 50

## 2017-06-28 MED ORDER — ADULT MULTIVITAMIN W/MINERALS CH
1.0000 | ORAL_TABLET | Freq: Every day | ORAL | Status: DC
  Administered 2017-06-29 – 2017-06-30 (×2): 1 via ORAL
  Filled 2017-06-28 (×2): qty 1

## 2017-06-28 MED ORDER — FERROUS SULFATE 325 (65 FE) MG PO TABS
325.0000 mg | ORAL_TABLET | Freq: Every day | ORAL | Status: DC
  Administered 2017-06-29 – 2017-06-30 (×2): 325 mg via ORAL
  Filled 2017-06-28 (×2): qty 1

## 2017-06-28 MED ORDER — STROKE: EARLY STAGES OF RECOVERY BOOK: Freq: Once | Status: DC

## 2017-06-28 MED ORDER — WHITE PETROLATUM EX OINT
TOPICAL_OINTMENT | CUTANEOUS | Status: AC
  Administered 2017-06-29: 1
  Filled 2017-06-28: qty 28.35

## 2017-06-28 MED ORDER — LABETALOL HCL 5 MG/ML IV SOLN: 10.0000 mg | INTRAVENOUS | Status: DC | PRN

## 2017-06-28 MED ORDER — SODIUM CHLORIDE 0.9 % IV SOLN
INTRAVENOUS | Status: DC
  Administered 2017-06-28: via INTRAVENOUS

## 2017-06-28 MED ORDER — NITROFURANTOIN MACROCRYSTAL 50 MG PO CAPS
100.0000 mg | ORAL_CAPSULE | Freq: Two times a day (BID) | ORAL | Status: DC
  Administered 2017-06-28 – 2017-06-30 (×4): 100 mg via ORAL
  Filled 2017-06-28: qty 1
  Filled 2017-06-28 (×2): qty 2
  Filled 2017-06-28: qty 1
  Filled 2017-06-28 (×3): qty 2

## 2017-06-28 NOTE — ED Notes (Signed)
Pt back from CT

## 2017-06-28 NOTE — Progress Notes (Signed)
50 ml of Isovue 370 extravastion in left forearm, Saverio Danker PA-C with radiology to assess the patient.  Patient's IV was removed and elevated, warm compress placed at the site.  Verbal instruction were given to Marlan Palau RN.  Extravasation orders placed in chart.

## 2017-06-28 NOTE — ED Notes (Signed)
Family at bedside. 

## 2017-06-28 NOTE — ED Notes (Signed)
Pt's family reports pt stated that she woke up this am around 0800, prayed her rosary and was finished around 0900.  He is just making sure that proper tx is implemented if needed.  [place] EDP made aware and no change in treatment plan is made at this time.  Family made aware.

## 2017-06-28 NOTE — Consult Note (Addendum)
Neurology Consultation  Reason for Consult: Code stroke Referring Physician: Dr Francia Greaves  CC: RSW  History is obtained from:EMS, chart  HPI: Sara Robertson is a 83 y.o. female  PMH HTN HLD CAD GERD, mRS 2-3 at baseline, resident of an ALF, LSN definitively last night noted at around 0800 to be weak on right and righ facial droop. Daugther said that she has been treated now for second time with abx for UTI. She has been feeling unwell and generally weak for 3 weeks now. EMS noted severe RFD, RUE weakness and moderate RLE weakness. This resolved en-route. On initial exam in ER, NIH for dysarthria. No weakness on RUE or RLE. NCCT head negative for bleed or acute changes of AIS. After the noncontrast CT was done and the patient was brought to the room, she had one episode of bilious looking vomiting.  She endorsed some abdominal discomfort at that time.  Her speech also became more dysarthric.  She also had difficulty naming at that time.  Was only able to name simple objects like pen but not able to name objects like watching or thumb.   LKW: 2200 06/27/2017 tpa given?: no, outside the window Premorbid modified Rankin scale (mRS):2-3 ROS: A 14 point ROS was performed and is negative except as noted in the HPI.    Past Medical History:  Diagnosis Date  . Anemia   . Angina pectoris ([place])   . CAD (coronary artery disease)   . GERD (gastroesophageal reflux disease)   . Headache   . Hyperlipemia   . Hypertension   . OSA (obstructive sleep apnea)   . SOB (shortness of breath)     Family History  Problem Relation Age of Onset  . Cancer Mother   . Cancer Sister   . Cancer Brother      Social History:   reports that  has never smoked. she has never used smokeless tobacco. She reports that she does not drink alcohol. Her drug history is not on file.  Medications No current facility-administered medications for this encounter.   Current Outpatient Medications:  .  ampicillin  (PRINCIPEN) 250 MG capsule, Take 250 mg by mouth 1 day or 1 dose. Reported on 02/11/2016, Disp: , Rfl:  .  aspirin 81 MG tablet, Take 81 mg by mouth daily., Disp: , Rfl:  .  bisoprolol-hydrochlorothiazide (ZIAC) 5-6.25 MG tablet, Take 1 tablet by mouth daily., Disp: , Rfl:  .  cyanocobalamin 500 MCG tablet, Take 500 mcg by mouth daily., Disp: , Rfl:  .  ferrous sulfate 325 (65 FE) MG tablet, Take 325 mg by mouth daily with breakfast. , Disp: , Rfl:  .  furosemide (LASIX) 40 MG tablet, Take 40 mg by mouth daily., Disp: , Rfl:  .  losartan (COZAAR) 50 MG tablet, Take 50 mg by mouth daily., Disp: , Rfl:  .  Multiple Vitamins-Minerals (MULTIVITAMIN WITH MINERALS) tablet, Take 1 tablet by mouth daily., Disp: , Rfl:  .  omeprazole (PRILOSEC) 40 MG capsule, Take 40 mg by mouth daily., Disp: , Rfl:   Facility-Administered Medications Ordered in Other Encounters:  .  0.9 %  sodium chloride infusion, 250 mL, Intravenous, Once, Corcoran, Melissa C, MD .  acetaminophen (TYLENOL) tablet 650 mg, 650 mg, Oral, Once, Corcoran, Melissa C, MD .  diphenhydrAMINE (BENADRYL) capsule 25 mg, 25 mg, Oral, Once, Corcoran, Melissa C, MD .  heparin lock flush 100 unit/mL, 500 Units, Intracatheter, Daily PRN, Mike Gip, Melissa C, MD .  heparin lock flush  100 unit/mL, 250 Units, Intracatheter, PRN, Corcoran, Melissa C, MD .  sodium chloride 0.9 % injection 10 mL, 10 mL, Intracatheter, PRN, Corcoran, Melissa C, MD .  sodium chloride 0.9 % injection 3 mL, 3 mL, Intracatheter, PRN, Mike Gip, Drue Second, MD  Exam: Current vital signs: Wt 86.7 kg (191 lb 2.2 oz)   BMI 37.33 kg/m   Vital signs in last 24 hours: Weight:  [86.7 kg (191 lb 2.2 oz)] 86.7 kg (191 lb 2.2 oz) (12/02 0900)  GENERAL: Awake, alert in NAD HEENT: - Normocephalic and atraumatic, dry mm, no LN++, no Thyromegally LUNGS - Clear to auscultation bilaterally with no wheezes CV - S1S2 RRR, no m/r/g, equal pulses bilaterally. ABDOMEN - Soft, nontender,  nondistended with normoactive BS Ext: warm, well perfused, intact peripheral pulses, noedema  NEURO:  Mental Status: AA&Ox3  Language: speech is dysarthric .  Naming, repetition, fluency, and comprehension intact but had a fluctuating course with worsening ability to name intermittently. Cranial Nerves: PERRL. EOMI, visual fields full, initially no facial asymmetry, progressively possible mild flattening of the right nasolabial fold facial sensation intact, hearing reduced bilaterally, tongue/uvula/soft palate midline, normal sternocleidomastoid and trapezius muscle strength. No evidence of tongue atrophy or fibrillations Motor: Symmetric 5/5 bilateral upper and lower extremities Tone: is normal and bulk is normal Sensation- Intact to light touch bilaterally, no evidence of neglect Coordination: FTN intact bilaterally, no ataxia in BLE. Gait- deferred   Labs I have reviewed labs in epic and the results pertinent to this consultation are: Mild anemia, hyponatremia, hypoglycemia, possible AK I CBC    Component Value Date/Time   WBC 6.9 08/18/2016 0920   RBC 3.80 08/18/2016 0920   HGB 11.9 (L) 08/18/2016 0920   HCT 35.2 08/18/2016 0920   PLT 248 08/18/2016 0920   MCV 92.5 08/18/2016 0920   MCH 31.3 08/18/2016 0920   MCHC 33.8 08/18/2016 0920   RDW 14.3 08/18/2016 0920   LYMPHSABS 1.7 02/11/2016 1015   MONOABS 0.8 02/11/2016 1015   EOSABS 0.3 02/11/2016 1015   BASOSABS 0.1 02/11/2016 1015    CMP     Component Value Date/Time   NA 128 (L) 06/27/2015 1644   K 4.7 06/27/2015 1644   CL 97 (L) 06/27/2015 1644   CO2 24 06/27/2015 1644   GLUCOSE 122 (H) 06/27/2015 1644   BUN 17 06/27/2015 1644   CREATININE 1.09 (H) 06/27/2015 1644   CALCIUM 8.7 (L) 06/27/2015 1644   GFRNONAA 42 (L) 06/27/2015 1644   GFRAA 48 (L) 06/27/2015 1644    Imaging I have reviewed the images obtained:  CT-scan of the brain -shows chronic white matter changes, no evidence of acute bleed, no hyperdense  vessels.  CTA: no LVO. Carotid bifurcation stenoses and vert stenoses b/l but basilar is open. Contrast extravasation in the arm.   Assessment:  83 year old woman with a past medical history of hypertension hyperlipidemia, coronary artery disease who was in her usual state of health best by history last night when she went to bed.  Upon waking up this morning, she was noted to have right-sided weakness and right facial droop.  EMS evaluated her and confirm the right-sided weakness and brought her to [facility] emergency room as an acute code stroke.  Upon initial evaluation, she had mild dysarthria and no right-sided weakness, noncontrast CT of the head was negative. Within a few minutes on repeat evaluation while the patient came back from the CT, seem like she had more dysarthria and possible subtle right facial  droop along with increasing difficulty naming. I had concern for posterior circulation infarct versus left MCA infarct. CT angiogram of the head and neck was then ordered. No LVO.  Likely Acute ischemic stroke - small vessel vs small embolic Not a candidate for tPA - outside window. Not a candidate for IAT - no LVO.  Recommendations: -Admit to hospitalist  -Telemetry monitoring -Allow for permissive hypertension for the first 24-48h - only treat PRN if SBP >220 mmHg. Blood pressures can be gradually normalized to SBP<140 upon discharge. -MRI brain without contrast -Echocardiogram -HgbA1c, fasting lipid panel -Frequent neuro checks -Prophylactic therapy-Antiplatelet med: Aspirin - dose 325mg  PO or 300mg  PR -Atorvastatin 80 mg PO daily -Risk factor modification -PT consult, OT consult, Speech consult  Please page stroke NP/PA/MD (listed on AMION)  from 8am-4 pm as this patient will be followed by the stroke team at this point.  -- Amie Portland, MD [facility] [phone] If 7pm to 7am, please call on call as listed on AMION.  CRITICAL CARE Performed by: Amie Portland   Total critical care time: 50 minutes  Critical care time was exclusive of separately billable procedures and treating other patients.  Critical care was necessary to treat or prevent imminent or life-threatening deterioration.  Critical care was time spent personally by me on the following activities: development of treatment plan with patient and/or surrogate as well as nursing, discussions with consultants, evaluation of patient's response to treatment, examination of patient, obtaining history from patient or surrogate, ordering and performing treatments and interventions, ordering and review of laboratory studies, ordering and review of radiographic studies, pulse oximetry and re-evaluation of patient's condition.

## 2017-06-28 NOTE — ED Triage Notes (Signed)
Pt arrived via [facility] from nursing home. Last known normal stated by EMS was 0800. EMS states that patients had right sided deficits however improved dramatically en route.

## 2017-06-28 NOTE — H&P (Signed)
History and Physical  Sara Robertson GUR:[id number] DOB: Sep 13, 1917 DOA: 06/28/2017  Referring physician: ER physician PCP: Idelle Crouch, MD  Outpatient Specialists:   Patient coming from: Independent living facility.  Chief Complaint: Possible TIA.  HPI: Patient is a 83 year old Caucasian female with past medical history significant for CAD, OSA, HTN and HL. Patient lives in an independent living facility. Patient is a very poor historian. Patient was seen alongside patient's daughter and daughter in law. Patient reports that she just felt that she was having a stroke, but couldn't or wouldn't elaborate any further. Collateral information endorsed history of dysarthria, facial asymmetry and drooling of saliva. Patient is currently asymptomatic. CT Angio head and Neck is non revealing. Patient was on Aspirin 81mg  po once daily prior to admission. Patient will be admitted for further assessment and management. Neurology has seen patient and advised full dose aspirin. No headache, no neck pain, no fever or chills, no GI symptoms and no Urinary symptoms. Patient has full power in all extremities.   ED Course: CT Angio Head and neck.  Pertinent labs: See above Imaging: independently reviewed.   Review of Systems:  12 point review of system was done. Pertinent positives and negatives are as in the history of presenting illness.   Past Medical History:  Diagnosis Date  . Anemia   . Angina pectoris ([place])   . CAD (coronary artery disease)   . GERD (gastroesophageal reflux disease)   . Headache   . Hyperlipemia   . Hypertension   . OSA (obstructive sleep apnea)   . SOB (shortness of breath)     No past surgical history on file.   reports that  has never smoked. she has never used smokeless tobacco. She reports that she does not drink alcohol. Her drug history is not on file.  Allergies  Allergen Reactions  . Celebrex [Celecoxib] Nausea And Vomiting  . Motrin [Ibuprofen] Nausea  And Vomiting  . Nexium [Esomeprazole Magnesium] Nausea And Vomiting    Family History  Problem Relation Age of Onset  . Cancer Mother   . Cancer Sister   . Cancer Brother      Prior to Admission medications   Medication Sig Start Date End Date Taking? Authorizing Provider  ferrous sulfate 325 (65 FE) MG tablet Take 325 mg by mouth daily with breakfast.    Yes [provider]  furosemide (LASIX) 40 MG tablet Take 20 mg by mouth daily.  11/17/16  Yes [provider]  losartan (COZAAR) 50 MG tablet Take 50 mg by mouth daily.   Yes [provider]  Multiple Vitamins-Minerals (MULTIVITAMIN WITH MINERALS) tablet Take 1 tablet by mouth daily.   Yes [provider]  Multiple Vitamins-Minerals (PRESERVISION AREDS 2 PO) Take 1 capsule by mouth daily.   Yes [provider]  nitrofurantoin (MACRODANTIN) 100 MG capsule Take 100 mg by mouth 2 (two) times daily.   Yes [provider]  tiZANidine (ZANAFLEX) 2 MG tablet Take 2 mg by mouth at bedtime.   Yes [provider]  bisoprolol-hydrochlorothiazide (ZIAC) 5-6.25 MG tablet Take 1 tablet by mouth daily.    [provider]    Physical Exam: Vitals:   06/28/17 1315 06/28/17 1330 06/28/17 1345 06/28/17 1400  BP: (!) 182/74 (!) 165/81 (!) 174/84 (!) 165/94  Pulse: 75 79 79 75  Resp: (!) 23 (!) 25 20 (!) 21  Temp:      TempSrc:      SpO2: 92% 93%  96% 95%  Weight:      Height:        Constitutional:  . Appears calm and comfortable Eyes:  . No pallor. No jaundice.  ENMT:  . external ears, nose appear normal Neck:  . Neck is supple. No JVD Respiratory:  . Decreased air entry globally. Marland Kitchen Respiratory effort normal. No retractions or accessory muscle use Cardiovascular:  . S1S2 . No LE extremity edema   Abdomen:  . Abdomen is soft and non tender. Organs are difficult to assess. Neurologic:  . Awake and alert. . Moves all limbs.  Wt Readings from Last 3 Encounters:   06/28/17 86.7 kg (191 lb 2.2 oz)  11/30/16 83.9 kg (185 lb)  08/18/16 83.4 kg (183 lb 13.8 oz)    I have personally reviewed following labs and imaging studies  Labs on Admission:  CBC: Recent Labs  Lab 06/28/17 0955 06/28/17 1001  WBC 7.5  --   NEUTROABS 4.9  --   HGB 11.4* 11.6*  HCT 34.3* 34.0*  MCV 94.0  --   PLT 199  --    Basic Metabolic Panel: Recent Labs  Lab 06/28/17 0955 06/28/17 1001  NA 135 138  K 3.8 3.9  CL 104 102  CO2 23  --   GLUCOSE 137* 140*  BUN 15 15  CREATININE 0.83 0.80  CALCIUM 8.9  --    Liver Function Tests: Recent Labs  Lab 06/28/17 0955  AST 28  ALT 27  ALKPHOS 61  BILITOT 0.8  PROT 6.2*  ALBUMIN 3.2*   No results for input(s): LIPASE, AMYLASE in the last 168 hours. No results for input(s): AMMONIA in the last 168 hours. Coagulation Profile: Recent Labs  Lab 06/28/17 0955  INR 1.03   Cardiac Enzymes: No results for input(s): CKTOTAL, CKMB, CKMBINDEX, TROPONINI in the last 168 hours. BNP (last 3 results) No results for input(s): PROBNP in the last 8760 hours. HbA1C: No results for input(s): HGBA1C in the last 72 hours. CBG: No results for input(s): GLUCAP in the last 168 hours. Lipid Profile: No results for input(s): CHOL, HDL, LDLCALC, TRIG, CHOLHDL, LDLDIRECT in the last 72 hours. Thyroid Function Tests: No results for input(s): TSH, T4TOTAL, FREET4, T3FREE, THYROIDAB in the last 72 hours. Anemia Panel: No results for input(s): VITAMINB12, FOLATE, FERRITIN, TIBC, IRON, RETICCTPCT in the last 72 hours. Urine analysis:    Component Value Date/Time   COLORURINE YELLOW 06/28/2017 1148   APPEARANCEUR HAZY (A) 06/28/2017 1148   LABSPEC 1.027 06/28/2017 1148   PHURINE 6.0 06/28/2017 1148   GLUCOSEU NEGATIVE 06/28/2017 1148   HGBUR NEGATIVE 06/28/2017 1148   BILIRUBINUR NEGATIVE 06/28/2017 1148   KETONESUR NEGATIVE 06/28/2017 1148   PROTEINUR NEGATIVE 06/28/2017 1148   NITRITE NEGATIVE 06/28/2017 1148    LEUKOCYTESUR NEGATIVE 06/28/2017 1148   Sepsis Labs: @LABRCNTIP (procalcitonin:4,lacticidven:4) )No results found for this or any previous visit (from the past 240 hour(s)).    Radiological Exams on Admission: Ct Angio Head W Or Wo Contrast  Result Date: 06/28/2017 CLINICAL DATA:  Patient woke up with slurred speech and RIGHT-sided weakness. Concern for basilar thrombosis. EXAM: CT ANGIOGRAPHY HEAD AND NECK TECHNIQUE: Multidetector CT imaging of the head and neck was performed using the standard protocol during bolus administration of intravenous contrast. Multiplanar CT image reconstructions and MIPs were obtained to evaluate the vascular anatomy. Carotid stenosis measurements (when applicable) are obtained utilizing NASCET criteria, using the distal internal carotid diameter as the denominator. CONTRAST:  97mL ISOVUE-370 IOPAMIDOL (ISOVUE-370) INJECTION 76%  COMPARISON:  CT head earlier in the day. FINDINGS: CTA NECK FINDINGS Aortic arch: Atherosclerosis. Conventional branching of the great vessels from the arch. No proximal great vessel stenosis. Right carotid system: No evidence of dissection, stenosis (50% or greater) or occlusion. Calcific plaque of a moderate nature. Left carotid system: Calcific and noncalcified plaque at the bifurcation, with a 50% stenosis based on luminal measurements of 1.9/3.9 proximal/ distal. No flow-limiting stenosis or dissection. Vertebral arteries: BILATERAL patent. No ostial narrowing of significance. Skeleton: Spondylosis.  Poor dentition. Other neck: Subcentimeter thyroid nodule.   No adenopathy. Upper chest: Pathologic mediastinal adenopathy is incompletely evaluated. CT of the chest contrast is recommended for further evaluation. No pneumothorax or visible nodule. Slight anterior pleural thickening on the LEFT. Review of the MIP images confirms the above findings CTA HEAD FINDINGS Anterior circulation: Calcification of the cavernous internal carotid arteries  consistent with cerebrovascular atherosclerotic disease. No signs of intracranial large vessel occlusion. No significant flow-limiting cavernous stenosis. M1 MCA branches widely patent. Mildly irregular BILATERAL A1 ACA segments. Posterior circulation: BILATERAL distal vertebral V4 segment calcifications, without flow-limiting stenosis. Basilar artery widely patent. No cerebellar branch occlusion. No PCA stenosis of significance. Venous sinuses: As permitted by contrast timing, patent. Anatomic variants: Fetal LEFT PCA. Delayed phase: Not performed. Review of the MIP images confirms the above findings IMPRESSION: Non flow-limiting intracranial and extracranial atherosclerotic disease as described. With regard to the clinical question, there is no basilar stenosis or dissection. No secondary signs of posterior circulation infarction. Non stenotic calcific atheromatous change of the distal vertebral arteries is observed. Pathologic mediastinal adenopathy is incompletely evaluated. CT of the chest with contrast is recommended for further evaluation. These results were communicated to stroke neurologist at 06/28/2017 11:09 amon 06/28/2017 by text page via the [facility] messaging system. Electronically Signed   By: Staci Righter M.D.   On: 06/28/2017 11:23   Ct Angio Neck W Or Wo Contrast  Result Date: 06/28/2017 CLINICAL DATA:  Patient woke up with slurred speech and RIGHT-sided weakness. Concern for basilar thrombosis. EXAM: CT ANGIOGRAPHY HEAD AND NECK TECHNIQUE: Multidetector CT imaging of the head and neck was performed using the standard protocol during bolus administration of intravenous contrast. Multiplanar CT image reconstructions and MIPs were obtained to evaluate the vascular anatomy. Carotid stenosis measurements (when applicable) are obtained utilizing NASCET criteria, using the distal internal carotid diameter as the denominator. CONTRAST:  34mL ISOVUE-370 IOPAMIDOL (ISOVUE-370) INJECTION 76% COMPARISON:  CT  head earlier in the day. FINDINGS: CTA NECK FINDINGS Aortic arch: Atherosclerosis. Conventional branching of the great vessels from the arch. No proximal great vessel stenosis. Right carotid system: No evidence of dissection, stenosis (50% or greater) or occlusion. Calcific plaque of a moderate nature. Left carotid system: Calcific and noncalcified plaque at the bifurcation, with a 50% stenosis based on luminal measurements of 1.9/3.9 proximal/ distal. No flow-limiting stenosis or dissection. Vertebral arteries: BILATERAL patent. No ostial narrowing of significance. Skeleton: Spondylosis.  Poor dentition. Other neck: Subcentimeter thyroid nodule.   No adenopathy. Upper chest: Pathologic mediastinal adenopathy is incompletely evaluated. CT of the chest contrast is recommended for further evaluation. No pneumothorax or visible nodule. Slight anterior pleural thickening on the LEFT. Review of the MIP images confirms the above findings CTA HEAD FINDINGS Anterior circulation: Calcification of the cavernous internal carotid arteries consistent with cerebrovascular atherosclerotic disease. No signs of intracranial large vessel occlusion. No significant flow-limiting cavernous stenosis. M1 MCA branches widely patent. Mildly irregular BILATERAL A1 ACA segments. Posterior circulation: BILATERAL distal vertebral  V4 segment calcifications, without flow-limiting stenosis. Basilar artery widely patent. No cerebellar branch occlusion. No PCA stenosis of significance. Venous sinuses: As permitted by contrast timing, patent. Anatomic variants: Fetal LEFT PCA. Delayed phase: Not performed. Review of the MIP images confirms the above findings IMPRESSION: Non flow-limiting intracranial and extracranial atherosclerotic disease as described. With regard to the clinical question, there is no basilar stenosis or dissection. No secondary signs of posterior circulation infarction. Non stenotic calcific atheromatous change of the distal  vertebral arteries is observed. Pathologic mediastinal adenopathy is incompletely evaluated. CT of the chest with contrast is recommended for further evaluation. These results were communicated to stroke neurologist at 06/28/2017 11:09 amon 06/28/2017 by text page via the [facility] messaging system. Electronically Signed   By: Staci Righter M.D.   On: 06/28/2017 11:23   Ct Head Code Stroke Wo Contrast  Result Date: 06/28/2017 CLINICAL DATA:  Code stroke. Woke up with RIGHT-sided weakness and slurred speech. EXAM: CT HEAD WITHOUT CONTRAST TECHNIQUE: Contiguous axial images were obtained from the base of the skull through the vertex without intravenous contrast. COMPARISON:  09/29/2008. FINDINGS: Brain: No evidence for acute infarction, hemorrhage, mass lesion, hydrocephalus, or extra-axial fluid. Generalized atrophy, not unexpected for age. Hypoattenuation of white matter representing small vessel disease. Vascular: Calcification of the cavernous internal carotid arteries consistent with cerebrovascular atherosclerotic disease. No signs of intracranial large vessel occlusion. Skull: Normal. Negative for fracture or focal lesion. Sinuses/Orbits: No acute finding.  BILATERAL cataract extraction. Other: Compared with priors, there has been progressive cerebral volume loss and white matter hypoattenuation. ASPECTS [facility] Stroke Program Early CT Score) - Ganglionic level infarction (caudate, lentiform nuclei, internal capsule, insula, M1-M3 cortex): 7 - Supraganglionic infarction (M4-M6 cortex): 3 Total score (0-10 with 10 being normal): 10 IMPRESSION: 1. Atrophy and small vessel disease.  No acute intracranial findings 2. ASPECTS is 10. These results were communicated to stroke neurologist at 06/28/2017 10:07 amon 06/28/2017 by text page via the [facility] messaging system. Electronically Signed   By: Staci Righter M.D.   On: 06/28/2017 10:18    Active Problems:   Acute CVA (cerebrovascular accident)  ([place])   Assessment/Plan 1. Possible acute CVA 2. Hypertension 3. Hyperlipidemia   Admit patient for further work up and management  Full dose aspirin  MRI Brain, ECHO and fasting lipid profile  Optimize BP cautiously  Consult PT  Neuro has already consulted on patient  Monitor renal function and electrolytes  Further management will depend on hospital course.  DVT prophylaxis:[place] Lovenox Code Status: Full Family Communication: Daughter and son in law Disposition Plan: Hopefully, home eventually.   Consults called: Neuro has already seen the patient   Admission status: Observation    Time spent: 60 minutes  Dana Allan, MD  [facility] Pager #: [phone] 7PM-7AM contact night coverage as above   06/28/2017, 2:09 PM

## 2017-06-28 NOTE — ED Notes (Signed)
Dr. Marthenia Rolling was at bedside to admit pt.

## 2017-06-28 NOTE — ED Provider Notes (Signed)
[place] EMERGENCY DEPARTMENT Provider Note   CSN: [id number] Arrival date & time: 06/28/17  2505     History   Chief Complaint Chief Complaint  Patient presents with  . Cerebrovascular Accident    HPI Sara Robertson is a 83 y.o. female.  83 year old female with prior history significant for hypertension, CAD, hyperlipidemia, and OSA presents for evaluation of possible stroke.  Patient was last known normal sometime around 9:30 PM yesterday.  Patient was found to be altered this morning at her facility.  She was sent here by [facility].  Code stroke was called upon arrival.  She has already been evaluated by the neuro hospitalist Dr. Malen Gauze.  Patient with mild dysarthria and mild right facial droop.    The history is provided by the patient and medical records.  Cerebrovascular Accident  This is a new problem. The current episode started 12 to 24 hours ago. The problem occurs constantly. The problem has not changed since onset.Pertinent negatives include no chest pain, no headaches and no shortness of breath. Associated symptoms comments: nausea. Nothing aggravates the symptoms. Nothing relieves the symptoms. She has tried nothing for the symptoms.    Past Medical History:  Diagnosis Date  . Anemia   . Angina pectoris ([place])   . CAD (coronary artery disease)   . GERD (gastroesophageal reflux disease)   . Headache   . Hyperlipemia   . Hypertension   . OSA (obstructive sleep apnea)   . SOB (shortness of breath)     Patient Active Problem List   Diagnosis Date Noted  . Iron deficiency anemia 06/29/2015    No past surgical history on file.  OB History    No data available       Home Medications    Prior to Admission medications   Medication Sig Start Date End Date Taking? Authorizing Provider  ampicillin (PRINCIPEN) 250 MG capsule Take 250 mg by mouth 1 day or 1 dose. Reported on 02/11/2016    [provider]  aspirin 81 MG tablet  Take 81 mg by mouth daily.    [provider]  bisoprolol-hydrochlorothiazide (ZIAC) 5-6.25 MG tablet Take 1 tablet by mouth daily.    [provider]  cyanocobalamin 500 MCG tablet Take 500 mcg by mouth daily.    [provider]  ferrous sulfate 325 (65 FE) MG tablet Take 325 mg by mouth daily with breakfast.     [provider]  furosemide (LASIX) 40 MG tablet Take 40 mg by mouth daily. 11/17/16   [provider]  losartan (COZAAR) 50 MG tablet Take 50 mg by mouth daily.    [provider]  Multiple Vitamins-Minerals (MULTIVITAMIN WITH MINERALS) tablet Take 1 tablet by mouth daily.    [provider]  omeprazole (PRILOSEC) 40 MG capsule Take 40 mg by mouth daily. 11/17/16   [provider]    Family History Family History  Problem Relation Age of Onset  . Cancer Mother   . Cancer Sister   . Cancer Brother     Social History Social History   Tobacco Use  . Smoking status: Never Smoker  . Smokeless tobacco: Never Used  Substance Use Topics  . Alcohol use: No  . Drug use: Not on file     Allergies   Celebrex [celecoxib]; Motrin [ibuprofen]; and Nexium [esomeprazole magnesium]   Review of Systems Review of Systems  Respiratory: Negative for shortness of breath.   Cardiovascular: Negative for chest  pain.  Neurological: Negative for headaches.  All other systems reviewed and are negative.    Physical Exam Updated Vital Signs BP (!) 180/140 (BP Location: Right Arm)   Pulse 87   Temp 98.8 F (37.1 C) (Oral)   Resp 17   Ht 5\' 4"  (1.626 m)   Wt 86.7 kg (191 lb 2.2 oz)   SpO2 94%   BMI 32.81 kg/m   Physical Exam  Constitutional: She appears well-developed and well-nourished. No distress.  HENT:  Head: Normocephalic and atraumatic.  Mouth/Throat: Oropharynx is clear and moist.  Eyes: Conjunctivae and EOM are normal. Pupils are equal, round, and reactive to light.  Neck: Normal range of motion.  Neck supple.  Cardiovascular: Normal rate, regular rhythm and normal heart sounds.  Pulmonary/Chest: Effort normal and breath sounds normal. No respiratory distress.  Abdominal: Soft. She exhibits no distension. There is no tenderness.  Musculoskeletal: Normal range of motion. She exhibits no edema or deformity.  Neurological: She is alert.  Mild dysarthria  Mild right facial droop  No appreciable extremity weakness to all 4 extremities   Skin: Skin is warm and dry.  Psychiatric: She has a normal mood and affect.  Nursing note and vitals reviewed.    ED Treatments / Results  Labs (all labs ordered are listed, but only abnormal results are displayed) Labs Reviewed  CBC - Abnormal; Notable for the following components:      Result Value   RBC 3.65 (*)    Hemoglobin 11.4 (*)    HCT 34.3 (*)    All other components within normal limits  DIFFERENTIAL - Abnormal; Notable for the following components:   Monocytes Absolute 1.2 (*)    All other components within normal limits  COMPREHENSIVE METABOLIC PANEL - Abnormal; Notable for the following components:   Glucose, Bld 137 (*)    Total Protein 6.2 (*)    Albumin 3.2 (*)    GFR calc non Af Amer 57 (*)    All other components within normal limits  I-STAT CHEM 8, ED - Abnormal; Notable for the following components:   Glucose, Bld 140 (*)    Hemoglobin 11.6 (*)    HCT 34.0 (*)    All other components within normal limits  ETHANOL  PROTIME-INR  APTT  RAPID URINE DRUG SCREEN, HOSP PERFORMED  URINALYSIS, ROUTINE W REFLEX MICROSCOPIC  I-STAT TROPONIN, ED    EKG  EKG Interpretation  Date/Time:  Sunday June 28 2017 10:20:14 EST Ventricular Rate:  90 PR Interval:    QRS Duration: 92 QT Interval:  371 QTC Calculation: 454 R Axis:   -5 Text Interpretation:  Sinus rhythm Multiple ventricular premature complexes Prolonged PR interval Confirmed by Dene Gentry [phone]) on 06/28/2017 11:15:44 AM       Radiology Ct Head  Code Stroke Wo Contrast  Result Date: 06/28/2017 CLINICAL DATA:  Code stroke. Woke up with RIGHT-sided weakness and slurred speech. EXAM: CT HEAD WITHOUT CONTRAST TECHNIQUE: Contiguous axial images were obtained from the base of the skull through the vertex without intravenous contrast. COMPARISON:  09/29/2008. FINDINGS: Brain: No evidence for acute infarction, hemorrhage, mass lesion, hydrocephalus, or extra-axial fluid. Generalized atrophy, not unexpected for age. Hypoattenuation of white matter representing small vessel disease. Vascular: Calcification of the cavernous internal carotid arteries consistent with cerebrovascular atherosclerotic disease. No signs of intracranial large vessel occlusion. Skull: Normal. Negative for fracture or focal lesion. Sinuses/Orbits: No acute finding.  BILATERAL cataract extraction. Other: Compared with priors, there has been progressive cerebral  volume loss and white matter hypoattenuation. ASPECTS [facility] Stroke Program Early CT Score) - Ganglionic level infarction (caudate, lentiform nuclei, internal capsule, insula, M1-M3 cortex): 7 - Supraganglionic infarction (M4-M6 cortex): 3 Total score (0-10 with 10 being normal): 10 IMPRESSION: 1. Atrophy and small vessel disease.  No acute intracranial findings 2. ASPECTS is 10. These results were communicated to stroke neurologist at 06/28/2017 10:07 amon 06/28/2017 by text page via the [facility] messaging system. Electronically Signed   By: Staci Righter M.D.   On: 06/28/2017 10:18    Procedures Procedures (including critical care time) CRITICAL CARE Performed by: Valarie Merino   Total critical care time: 30 minutes  Critical care time was exclusive of separately billable procedures and treating other patients.  Critical care was necessary to treat or prevent imminent or life-threatening deterioration.  Critical care was time spent personally by me on the following activities: development of treatment plan with patient  and/or surrogate as well as nursing, discussions with consultants, evaluation of patient's response to treatment, examination of patient, obtaining history from patient or surrogate, ordering and performing treatments and interventions, ordering and review of laboratory studies, ordering and review of radiographic studies, pulse oximetry and re-evaluation of patient's condition.   Medications Ordered in ED Medications  ondansetron (ZOFRAN) injection 4 mg (not administered)  iopamidol (ISOVUE-370) 76 % injection (50 mLs  Contrast Given 06/28/17 1045)     Initial Impression / Assessment and Plan / ED Course  I have reviewed the triage vital signs and the nursing notes.  Pertinent labs & imaging results that were available during my care of the patient were reviewed by me and considered in my medical decision making (see chart for details).     MSE Complete  Patient's presentation today is consistent with CVA.  Patient is not a candidate for TPA or IAT at this time.  Patient will be admitted to the hospitalist service for further workup and treatment.  Final Clinical Impressions(s) / ED Diagnoses   Final diagnoses:  Acute CVA (cerebrovascular accident) [facility])    ED Discharge Orders    None       Valarie Merino, MD 06/28/17 1123

## 2017-06-29 ENCOUNTER — Inpatient Hospital Stay (HOSPITAL_COMMUNITY): Payer: Medicare Other

## 2017-06-29 ENCOUNTER — Observation Stay (HOSPITAL_BASED_OUTPATIENT_CLINIC_OR_DEPARTMENT_OTHER): Payer: Medicare Other

## 2017-06-29 ENCOUNTER — Encounter (HOSPITAL_COMMUNITY): Payer: Self-pay | Admitting: Physical Medicine and Rehabilitation

## 2017-06-29 DIAGNOSIS — D509 Iron deficiency anemia, unspecified: Secondary | ICD-10-CM | POA: Diagnosis present

## 2017-06-29 DIAGNOSIS — N390 Urinary tract infection, site not specified: Secondary | ICD-10-CM | POA: Diagnosis present

## 2017-06-29 DIAGNOSIS — I519 Heart disease, unspecified: Secondary | ICD-10-CM

## 2017-06-29 DIAGNOSIS — I5189 Other ill-defined heart diseases: Secondary | ICD-10-CM

## 2017-06-29 DIAGNOSIS — Z792 Long term (current) use of antibiotics: Secondary | ICD-10-CM | POA: Diagnosis not present

## 2017-06-29 DIAGNOSIS — G2581 Restless legs syndrome: Secondary | ICD-10-CM | POA: Diagnosis present

## 2017-06-29 DIAGNOSIS — R471 Dysarthria and anarthria: Secondary | ICD-10-CM | POA: Diagnosis present

## 2017-06-29 DIAGNOSIS — E162 Hypoglycemia, unspecified: Secondary | ICD-10-CM | POA: Diagnosis present

## 2017-06-29 DIAGNOSIS — Z66 Do not resuscitate: Secondary | ICD-10-CM | POA: Diagnosis present

## 2017-06-29 DIAGNOSIS — N3941 Urge incontinence: Secondary | ICD-10-CM | POA: Diagnosis present

## 2017-06-29 DIAGNOSIS — G4733 Obstructive sleep apnea (adult) (pediatric): Secondary | ICD-10-CM | POA: Diagnosis present

## 2017-06-29 DIAGNOSIS — I69398 Other sequelae of cerebral infarction: Secondary | ICD-10-CM | POA: Diagnosis not present

## 2017-06-29 DIAGNOSIS — I63012 Cerebral infarction due to thrombosis of left vertebral artery: Secondary | ICD-10-CM | POA: Diagnosis not present

## 2017-06-29 DIAGNOSIS — I69393 Ataxia following cerebral infarction: Secondary | ICD-10-CM | POA: Diagnosis present

## 2017-06-29 DIAGNOSIS — E871 Hypo-osmolality and hyponatremia: Secondary | ICD-10-CM | POA: Diagnosis present

## 2017-06-29 DIAGNOSIS — I63512 Cerebral infarction due to unspecified occlusion or stenosis of left middle cerebral artery: Secondary | ICD-10-CM | POA: Diagnosis present

## 2017-06-29 DIAGNOSIS — I480 Paroxysmal atrial fibrillation: Secondary | ICD-10-CM | POA: Diagnosis present

## 2017-06-29 DIAGNOSIS — I639 Cerebral infarction, unspecified: Secondary | ICD-10-CM | POA: Diagnosis not present

## 2017-06-29 DIAGNOSIS — E785 Hyperlipidemia, unspecified: Secondary | ICD-10-CM

## 2017-06-29 DIAGNOSIS — R1310 Dysphagia, unspecified: Secondary | ICD-10-CM | POA: Diagnosis present

## 2017-06-29 DIAGNOSIS — I10 Essential (primary) hypertension: Secondary | ICD-10-CM

## 2017-06-29 DIAGNOSIS — K219 Gastro-esophageal reflux disease without esophagitis: Secondary | ICD-10-CM | POA: Diagnosis present

## 2017-06-29 DIAGNOSIS — I69322 Dysarthria following cerebral infarction: Secondary | ICD-10-CM | POA: Diagnosis not present

## 2017-06-29 DIAGNOSIS — I5030 Unspecified diastolic (congestive) heart failure: Secondary | ICD-10-CM | POA: Diagnosis not present

## 2017-06-29 DIAGNOSIS — D62 Acute posthemorrhagic anemia: Secondary | ICD-10-CM | POA: Diagnosis not present

## 2017-06-29 DIAGNOSIS — Z6832 Body mass index (BMI) 32.0-32.9, adult: Secondary | ICD-10-CM | POA: Diagnosis not present

## 2017-06-29 DIAGNOSIS — D649 Anemia, unspecified: Secondary | ICD-10-CM | POA: Diagnosis present

## 2017-06-29 DIAGNOSIS — I2721 Secondary pulmonary arterial hypertension: Secondary | ICD-10-CM

## 2017-06-29 DIAGNOSIS — R29810 Facial weakness: Secondary | ICD-10-CM | POA: Diagnosis present

## 2017-06-29 DIAGNOSIS — I2510 Atherosclerotic heart disease of native coronary artery without angina pectoris: Secondary | ICD-10-CM

## 2017-06-29 DIAGNOSIS — E669 Obesity, unspecified: Secondary | ICD-10-CM | POA: Diagnosis present

## 2017-06-29 DIAGNOSIS — R270 Ataxia, unspecified: Secondary | ICD-10-CM | POA: Diagnosis present

## 2017-06-29 DIAGNOSIS — R269 Unspecified abnormalities of gait and mobility: Secondary | ICD-10-CM | POA: Diagnosis not present

## 2017-06-29 DIAGNOSIS — R29705 NIHSS score 5: Secondary | ICD-10-CM | POA: Diagnosis not present

## 2017-06-29 DIAGNOSIS — R29702 NIHSS score 2: Secondary | ICD-10-CM | POA: Diagnosis present

## 2017-06-29 DIAGNOSIS — Z87440 Personal history of urinary (tract) infections: Secondary | ICD-10-CM | POA: Diagnosis not present

## 2017-06-29 DIAGNOSIS — R590 Localized enlarged lymph nodes: Secondary | ICD-10-CM | POA: Diagnosis present

## 2017-06-29 DIAGNOSIS — Z9181 History of falling: Secondary | ICD-10-CM | POA: Diagnosis not present

## 2017-06-29 DIAGNOSIS — Z90710 Acquired absence of both cervix and uterus: Secondary | ICD-10-CM | POA: Diagnosis not present

## 2017-06-29 LAB — BASIC METABOLIC PANEL
Anion gap: 5 (ref 5–15)
BUN: 12 mg/dL (ref 6–20)
CO2: 27 mmol/L (ref 22–32)
Calcium: 8.6 mg/dL — ABNORMAL LOW (ref 8.9–10.3)
Chloride: 103 mmol/L (ref 101–111)
Creatinine, Ser: 0.74 mg/dL (ref 0.44–1.00)
GFR calc Af Amer: >60 mL/min (ref >60)
GFR calc non Af Amer: >60 mL/min (ref >60)
Glucose, Bld: 112 mg/dL — ABNORMAL HIGH (ref 65–99)
Potassium: 3.8 mmol/L (ref 3.5–5.1)
Sodium: 135 mmol/L (ref 135–145)

## 2017-06-29 LAB — CBC
HCT: 31 % — ABNORMAL LOW (ref 36.0–46.0)
Hemoglobin: 10.3 g/dL — ABNORMAL LOW (ref 12.0–15.0)
MCH: 31.1 pg (ref 26.0–34.0)
MCHC: 33.2 g/dL (ref 30.0–36.0)
MCV: 93.7 fL (ref 78.0–100.0)
Platelets: 202 10*3/uL (ref 150–400)
RBC: 3.31 MIL/uL — ABNORMAL LOW (ref 3.87–5.11)
RDW: 13.3 % (ref 11.5–15.5)
WBC: 6.8 10*3/uL (ref 4.0–10.5)

## 2017-06-29 LAB — LIPID PANEL
Cholesterol: 179 mg/dL (ref 0–200)
HDL: 40 mg/dL — ABNORMAL LOW (ref >40)
LDL Cholesterol: 125 mg/dL — ABNORMAL HIGH (ref 0–99)
Total CHOL/HDL Ratio: 4.5 RATIO
Triglycerides: 71 mg/dL (ref <150)
VLDL: 14 mg/dL (ref 0–40)

## 2017-06-29 LAB — ECHOCARDIOGRAM COMPLETE
Height: 64 in
Weight: 3058.22 oz

## 2017-06-29 MED ORDER — ATORVASTATIN CALCIUM 10 MG PO TABS
20.0000 mg | ORAL_TABLET | Freq: Every day | ORAL | Status: DC
  Filled 2017-06-29: qty 2

## 2017-06-29 NOTE — Plan of Care (Signed)
Pt accepting/recognizing physical barriers/limitations.

## 2017-06-29 NOTE — Care Management Note (Signed)
Case Management Note  Patient Details  Name: Sara Robertson MRN: [id number] Date of Birth: 05/05/1918  Subjective/Objective:     Pt admitted with CVA. She is from [facility].                Action/Plan: Awaiting PT/OT recommendations. CM following for d/c disposition.  Expected Discharge Date:                  Expected Discharge Plan:     In-House Referral:     Discharge planning Services     Post Acute Care Choice:    Choice offered to:     DME Arranged:    DME Agency:     HH Arranged:    HH Agency:     Status of Service:  In process, will continue to follow  If discussed at Long Length of Stay Meetings, dates discussed:    Additional Comments:  Pollie Friar, RN 06/29/2017, 1:26 PM

## 2017-06-29 NOTE — Progress Notes (Signed)
OT Cancellation Note  Patient Details Name: Sara Robertson MRN: [id number] DOB: May 03, 1918   Cancelled Treatment:    Reason Eval/Treat Not Completed: Other (comment). Pt just got breakfast, will try back later today for eval.  Almon Register [phone] 06/29/2017, 7:53 AM

## 2017-06-29 NOTE — Progress Notes (Signed)
OOB PER WALKER MOD ASSIST TO BR. BALANCE ISSUES NOTED, TOILET TRANSFER MOD ASSIST. SUPERVISION SETUP WITH TOILETING.

## 2017-06-29 NOTE — Progress Notes (Signed)
STROKE TEAM PROGRESS NOTE   SUBJECTIVE (INTERVAL HISTORY) Family is at the bedside. Patient is found laying in bed in NAD  Overall she feels her condition is gradually improving. She continues to have some Left side weakness and dysarthria. Voices no new complaints. No new events reported overnight.  OBJECTIVE Lab Results: CBC:  Recent Labs  Lab 06/28/17 0955 06/28/17 1001 06/28/17 1744 06/29/17 0435  WBC 7.5  --  8.4 6.8  HGB 11.4* 11.6* 11.4* 10.3*  HCT 34.3* 34.0* 34.8* 31.0*  MCV 94.0  --  94.1 93.7  PLT 199  --  192 202   BMP: Recent Labs  Lab 06/28/17 0955 06/28/17 1001 06/28/17 1744 06/29/17 0435  NA 135 138  --  135  K 3.8 3.9  --  3.8  CL 104 102  --  103  CO2 23  --   --  27  GLUCOSE 137* 140*  --  112*  BUN 15 15  --  12  CREATININE 0.83 0.80 0.76 0.74  CALCIUM 8.9  --   --  8.6*  MG  --   --  1.9  --   PHOS  --   --  3.2  --    Liver Function Tests:  Recent Labs  Lab 06/28/17 0955  AST 28  ALT 27  ALKPHOS 61  BILITOT 0.8  PROT 6.2*  ALBUMIN 3.2*   Coagulation Studies:  Recent Labs    06/28/17 0955  APTT 32  INR 1.03   Urinalysis:  Recent Labs  Lab 06/28/17 1148  COLORURINE YELLOW  APPEARANCEUR HAZY*  LABSPEC 1.027  PHURINE 6.0  GLUCOSEU NEGATIVE  HGBUR NEGATIVE  BILIRUBINUR NEGATIVE  KETONESUR NEGATIVE  PROTEINUR NEGATIVE  NITRITE NEGATIVE  LEUKOCYTESUR NEGATIVE   PHYSICAL EXAM Temp:  [97.6 F (36.4 C)-98.4 F (36.9 C)] 98.4 F (36.9 C) (12/03 1335) Pulse Rate:  [63-83] 70 (12/03 1335) Resp:  [20-21] 20 (12/03 1335) BP: (111-165)/(44-94) 131/54 (12/03 1335) SpO2:  [94 %-99 %] 99 % (12/03 1335) General - Well nourished, well developed, in no apparent distress Respiratory - Lungs clear bilaterally. No wheezing. Cardiovascular - Regular rate and rhythm  NEURO:  Mental Status: AA&Ox3  Language: speech is dysarthric .  Naming, repetition, fluency, and comprehension intact but had a fluctuating course with worsening ability  to name intermittently. Cranial Nerves: PERRL. EOMI, visual fields full, initially no facial asymmetry, progressively possible mild flattening of the right nasolabial fold facial sensation intact, hearing reduced bilaterally, tongue/uvula/soft palate midline, normal sternocleidomastoid and trapezius muscle strength. No evidence of tongue atrophy or fibrillations Motor: Left side weakness 4+/5 in UE and 4++ in LLE. Right side 5/5 bilateral upper and lower extremities Tone: is normal and bulk is normal Sensation- Intact to light touch bilaterally, no evidence of neglect Coordination: FTN ataxic on Left Gait- deferred  IMAGING: I have personally reviewed the radiological images below and agree with the radiology interpretations.  Ct Angio Head & Neck W Or Wo Contrast Result Date: 06/28/2017 IMPRESSION: Non flow-limiting intracranial and extracranial atherosclerotic disease as described. With regard to the clinical question, there is no basilar stenosis or dissection. No secondary signs of posterior circulation infarction. Non stenotic calcific atheromatous change of the distal vertebral arteries is observed. Pathologic mediastinal adenopathy is incompletely evaluated. CT of the chest with contrast is recommended for further evaluation.   Mr Brain Wo Contrast Result Date: 06/28/2017 IMPRESSION: 1. Mild motion degraded examination. 2. Multiple acute small LEFT cerebellar/SCA territory nonhemorrhagic infarcts. 3. Otherwise negative  noncontrast MRI of the head for age.   Ct Head Code Stroke Wo Contrast Result Date: 06/28/2017 IMPRESSION: 1. Atrophy and small vessel disease.  No acute intracranial findings 2. ASPECTS is 10.   Echocardiogram: Study Conclusions - Left ventricle: The cavity size was normal. There was mild   concentric hypertrophy. Systolic function was normal. The   estimated ejection fraction was in the range of 50% to 55%. Wall   motion was normal; there were no regional wall  motion   abnormalities. Features are consistent with a pseudonormal left   ventricular filling pattern, with concomitant abnormal relaxation   and increased filling pressure (grade 2 diastolic dysfunction).   Doppler parameters are consistent with indeterminate ventricular   filling pressure. - Aortic valve: Valve mobility was restricted. There was moderate   stenosis. There was no regurgitation. Peak velocity (S): 341   cm/s. Mean gradient (S): 24 mm Hg. Valve area (VTI): 0.55 cm^2.   Valve area (Vmax): 0.63 cm^2. Valve area (Vmean): 0.63 cm^2. - Mitral valve: Transvalvular velocity was within the normal range.   There was no evidence for stenosis. There was trivial   regurgitation. - Left atrium: The atrium was severely dilated. - Tricuspid valve: There was mild regurgitation. - Pulmonary arteries: Systolic pressure was severely increased. PA   peak pressure: 82 mm Hg (S). _____________________________________________________________________ ASSESSMENT: Ms. RUSTI ARIZMENDI is a 83 y.o. female with PMH of HTN, HLD, CAD admitted for complaints of Right sided weakness and facial droop. Initial Head CT negative for CVA. CTA showed no LVO. Not a tPA candidate - outside window. MRI shows multiple acute small LEFT cerebellar/SCA territory nonhemorrhagic infarcts  06/29/17: Left sided weakness slowly improving. Continues to have dysarthria. AFIB intermittently noted on telemetry. Family not interested in [facility] as patient has history of falls. Risks of further strokes explained and family verbalizes good understanding.   Multiple acute small LEFT cerebellar/SCA territory nonhemorrhagic infarcts:  Suspected Etiology:small vessel vs embolic Resultant Symptoms: Right sided weakness and facial droop Stroke Risk Factors: carotid stenosis, hyperlipidemia and hypertension Other Stroke Risk Factors: Advanced age, Obesity, Body mass index is 32.81 kg/m.  Coronary artery disease, CHF  Outstanding Stroke  Work-up Studies:                  HgbA1C pending  PLAN  06/29/2017: Continue Aspirin/ Statin Ongoing aggressive stroke risk factor management Patient counseled to be compliant with her antithrombotic medications No need for further AFIB w/u - family does not want AC therapy Follow up with PCP, Cardiology and Neurology  Dysarthria & DYSPHAGIA: NPO until passes SLP swallow evaluation  HYPERTENSION: Stable Permissive hypertension (OK if <220/120) for 24-48 hours post stroke and then gradually normalized within 5-7 days. Long term BP goal normotensive. May slowly restart home B/P medications after 48 hours Home Meds: Cozaar  HYPERLIPIDEMIA:    Component Value Date/Time   CHOL 179 06/29/2017 0435   TRIG 71 06/29/2017 0435   HDL 40 (L) 06/29/2017 0435   CHOLHDL 4.5 06/29/2017 0435   VLDL 14 06/29/2017 0435   LDLCALC 125 (H) 06/29/2017 0435  Home Meds:  NONE LDL  goal < 70 Started on Lipitor to 20 mg daily, PCP to re-check LDL in one month and adjust dose as needed Continue statin at discharge  R/O DIABETES: No results found for: HGBA1C No results for input(s): GLUCAP in the last 168 hours. HgbA1c goal < 7.0  OBESITY Obesity, Body mass index is 32.81 kg/m. Greater than/equal to 30  Other Active Problems: Active Problems:   Acute CVA (cerebrovascular accident) ([place]) Mild anemia, hyponatremia, hypoglycemia, possible AK I, mediastinal adenopathy, possible UTI - management per Medicine team  Hospital day # 0  VTE prophylaxis: Lovenox  Diet : Diet Heart Room service appropriate? Yes; Fluid consistency: Thin   Prior Home Stroke Medications:No antithrombotic  Hospital Current Stroke Medications: ASA 325 mg Stroke New Meds Plan: Now on aspirin 325 mg daily and Lipitor 20 mg  Discharge Stroke Meds: Please discharge patient on aspirin 325 mg daily and Lipitor 20 mg   Disposition: 01-Home or Self Care Therapy Recs:  CIR vs Homeplace ALF Follow Recs:  Follow-up Information     Rosalin Hawking, MD. Schedule an appointment as soon as possible for a visit in 6 week(s).   Specialty:  Neurology Contact information: [address] [place] [id number] [phone]          Idelle Crouch, MD-PCP in 1-2 weeks  FAMILY UPDATES: No family at bedside  TEAM UPDATES: Geradine Girt, DO  Renie Ora Stroke Neurology Team 06/29/2017 1:59 PM  Attending note: I reviewed above note and agree with the assessment and plan. I have made any additions or clarifications directly to the above note. Pt was seen and examined.   83 year old female with history of CAD, HTN, HLD, OSA, living ALF admitted for right-sided weakness, right facial droop and dysarthria.  MRI showed left MCA infarct.  CTA head and neck showed atherosclerosis bilateral ICA siphon, bilateral ICA proximal, bilateral distal VA.  LDL 125, A1c pending, UDS negative.  EF 50-55%.  Stroke most likely due to atherosclerosis. Patient symptoms much improved and nearly resolved.  Currently on aspirin 325 and Lipitor 40.  Pending CIR admission for further rehab.  Neurology will sign off. Please call with questions. Pt will follow up with Cecille Rubin, NP, at [facility] in about 6 weeks. Thanks for the consult.   Rosalin Hawking, MD PhD Stroke Neurology 06/29/2017 4:48 PM   To contact Stroke Continuity provider, please refer to [email or web address] After hours, contact General Neurology

## 2017-06-29 NOTE — Progress Notes (Addendum)
PROGRESS NOTE    Sara Robertson  HYW:[id number] DOB: 07-Sep-1917 DOA: 06/28/2017 PCP: Idelle Crouch, MD   Outpatient Specialists:*     Brief Narrative:  Patient is a 83 year old Caucasian female with past medical history significant for CAD, OSA, HTN and HL. Patient lives in an independent living facility. Patient is a very poor historian. Patient was seen alongside patient's daughter and daughter in law. Patient reports that she just felt that she was having a stroke, but couldn't or wouldn't elaborate any further. Collateral information endorsed history of dysarthria, facial asymmetry and drooling of saliva. Patient is currently asymptomatic. CT Angio head and Neck is non revealing. Patient was on Aspirin 81mg  po once daily prior to admission. Patient will be admitted for further assessment and management. Neurology has seen patient and advised full dose aspirin. No headache, no neck pain, no fever or chills, no GI symptoms and no Urinary symptoms. Patient has full power in all extremities.     Assessment & Plan:   Active Problems:   Acute CVA (cerebrovascular accident) ([place])   CVA -MRI shows multiple small acute CVAs in cerebellum -LDL: 125- statin HgbA1c pending -no h/o a fib-- follow tele -neuro consult -PT/OT/SLP -from ILF-- but can go to higher level of care -echo pending  HTN -permissive HTN for now  HLD -on statin  incidental finding: Pathologic mediastinal adenopathy is incompletely evaluated. CT of the chest with contrast is recommended for further evaluation-- outpatient follow up  DVT prophylaxis:  Lovenox   Code Status: DNR   Family Communication: At bedside  Disposition Plan:  Pending PT/OT   Consultants:   neuro     Subjective: Per family, only noticing some slurred speech, patient from ILF  Objective: Vitals:   06/28/17 2239 06/28/17 2344 06/29/17 0545 06/29/17 1335  BP: (!) 129/44 111/86 (!) 144/64 (!) 131/54  Pulse: 80  63  70  Resp: 20  20 20   Temp: 97.9 F (36.6 C)  97.6 F (36.4 C) 98.4 F (36.9 C)  TempSrc: Oral  Oral Oral  SpO2: 98%  97% 99%  Weight:      Height:       No intake or output data in the 24 hours ending 06/29/17 1355 Filed Weights   06/28/17 0900 06/28/17 1024  Weight: 86.7 kg (191 lb 2.2 oz) 86.7 kg (191 lb 2.2 oz)    Examination:  General exam: resting Respiratory system: Clear to auscultation. Respiratory effort normal. Cardiovascular system: S1 & S2 heard, RRR. No JVD, murmurs, rubs, gallops or clicks. No pedal edema. Gastrointestinal system: Abdomen is nondistended, soft and nontender. No organomegaly or masses felt. Normal bowel sounds heard. Central nervous system: Alert     Data Reviewed: I have personally reviewed following labs and imaging studies  CBC: Recent Labs  Lab 06/28/17 0955 06/28/17 1001 06/28/17 1744 06/29/17 0435  WBC 7.5  --  8.4 6.8  NEUTROABS 4.9  --   --   --   HGB 11.4* 11.6* 11.4* 10.3*  HCT 34.3* 34.0* 34.8* 31.0*  MCV 94.0  --  94.1 93.7  PLT 199  --  192 485   Basic Metabolic Panel: Recent Labs  Lab 06/28/17 0955 06/28/17 1001 06/28/17 1744 06/29/17 0435  NA 135 138  --  135  K 3.8 3.9  --  3.8  CL 104 102  --  103  CO2 23  --   --  27  GLUCOSE 137* 140*  --  112*  BUN 15  15  --  12  CREATININE 0.83 0.80 0.76 0.74  CALCIUM 8.9  --   --  8.6*  MG  --   --  1.9  --   PHOS  --   --  3.2  --    GFR: Estimated Creatinine Clearance: 42.8 mL/min (by C-G formula based on SCr of 0.74 mg/dL). Liver Function Tests: Recent Labs  Lab 06/28/17 0955  AST 28  ALT 27  ALKPHOS 61  BILITOT 0.8  PROT 6.2*  ALBUMIN 3.2*   No results for input(s): LIPASE, AMYLASE in the last 168 hours. No results for input(s): AMMONIA in the last 168 hours. Coagulation Profile: Recent Labs  Lab 06/28/17 0955  INR 1.03   Cardiac Enzymes: No results for input(s): CKTOTAL, CKMB, CKMBINDEX, TROPONINI in the last 168 hours. BNP (last 3 results) No  results for input(s): PROBNP in the last 8760 hours. HbA1C: No results for input(s): HGBA1C in the last 72 hours. CBG: No results for input(s): GLUCAP in the last 168 hours. Lipid Profile: Recent Labs    06/28/17 1744 06/29/17 0435  CHOL 202* 179  HDL 45 40*  LDLCALC 143* 125*  TRIG 68 71  CHOLHDL 4.5 4.5   Thyroid Function Tests: No results for input(s): TSH, T4TOTAL, FREET4, T3FREE, THYROIDAB in the last 72 hours. Anemia Panel: No results for input(s): VITAMINB12, FOLATE, FERRITIN, TIBC, IRON, RETICCTPCT in the last 72 hours. Urine analysis:    Component Value Date/Time   COLORURINE YELLOW 06/28/2017 1148   APPEARANCEUR HAZY (A) 06/28/2017 1148   LABSPEC 1.027 06/28/2017 1148   PHURINE 6.0 06/28/2017 1148   GLUCOSEU NEGATIVE 06/28/2017 1148   HGBUR NEGATIVE 06/28/2017 [place] 06/28/2017 1148   KETONESUR NEGATIVE 06/28/2017 1148   PROTEINUR NEGATIVE 06/28/2017 1148   NITRITE NEGATIVE 06/28/2017 1148   LEUKOCYTESUR NEGATIVE 06/28/2017 1148     )No results found for this or any previous visit (from the past 240 hour(s)).    Anti-infectives (From admission, onward)   None       Radiology Studies: Ct Angio Head W Or Wo Contrast  Result Date: 06/28/2017 CLINICAL DATA:  Patient woke up with slurred speech and RIGHT-sided weakness. Concern for basilar thrombosis. EXAM: CT ANGIOGRAPHY HEAD AND NECK TECHNIQUE: Multidetector CT imaging of the head and neck was performed using the standard protocol during bolus administration of intravenous contrast. Multiplanar CT image reconstructions and MIPs were obtained to evaluate the vascular anatomy. Carotid stenosis measurements (when applicable) are obtained utilizing NASCET criteria, using the distal internal carotid diameter as the denominator. CONTRAST:  2mL ISOVUE-370 IOPAMIDOL (ISOVUE-370) INJECTION 76% COMPARISON:  CT head earlier in the day. FINDINGS: CTA NECK FINDINGS Aortic arch: Atherosclerosis.  Conventional branching of the great vessels from the arch. No proximal great vessel stenosis. Right carotid system: No evidence of dissection, stenosis (50% or greater) or occlusion. Calcific plaque of a moderate nature. Left carotid system: Calcific and noncalcified plaque at the bifurcation, with a 50% stenosis based on luminal measurements of 1.9/3.9 proximal/ distal. No flow-limiting stenosis or dissection. Vertebral arteries: BILATERAL patent. No ostial narrowing of significance. Skeleton: Spondylosis.  Poor dentition. Other neck: Subcentimeter thyroid nodule.   No adenopathy. Upper chest: Pathologic mediastinal adenopathy is incompletely evaluated. CT of the chest contrast is recommended for further evaluation. No pneumothorax or visible nodule. Slight anterior pleural thickening on the LEFT. Review of the MIP images confirms the above findings CTA HEAD FINDINGS Anterior circulation: Calcification of the cavernous internal carotid arteries consistent  with cerebrovascular atherosclerotic disease. No signs of intracranial large vessel occlusion. No significant flow-limiting cavernous stenosis. M1 MCA branches widely patent. Mildly irregular BILATERAL A1 ACA segments. Posterior circulation: BILATERAL distal vertebral V4 segment calcifications, without flow-limiting stenosis. Basilar artery widely patent. No cerebellar branch occlusion. No PCA stenosis of significance. Venous sinuses: As permitted by contrast timing, patent. Anatomic variants: Fetal LEFT PCA. Delayed phase: Not performed. Review of the MIP images confirms the above findings IMPRESSION: Non flow-limiting intracranial and extracranial atherosclerotic disease as described. With regard to the clinical question, there is no basilar stenosis or dissection. No secondary signs of posterior circulation infarction. Non stenotic calcific atheromatous change of the distal vertebral arteries is observed. Pathologic mediastinal adenopathy is incompletely  evaluated. CT of the chest with contrast is recommended for further evaluation. These results were communicated to stroke neurologist at 06/28/2017 11:09 amon 06/28/2017 by text page via the [facility] messaging system. Electronically Signed   By: Staci Righter M.D.   On: 06/28/2017 11:23   Ct Angio Neck W Or Wo Contrast  Result Date: 06/28/2017 CLINICAL DATA:  Patient woke up with slurred speech and RIGHT-sided weakness. Concern for basilar thrombosis. EXAM: CT ANGIOGRAPHY HEAD AND NECK TECHNIQUE: Multidetector CT imaging of the head and neck was performed using the standard protocol during bolus administration of intravenous contrast. Multiplanar CT image reconstructions and MIPs were obtained to evaluate the vascular anatomy. Carotid stenosis measurements (when applicable) are obtained utilizing NASCET criteria, using the distal internal carotid diameter as the denominator. CONTRAST:  33mL ISOVUE-370 IOPAMIDOL (ISOVUE-370) INJECTION 76% COMPARISON:  CT head earlier in the day. FINDINGS: CTA NECK FINDINGS Aortic arch: Atherosclerosis. Conventional branching of the great vessels from the arch. No proximal great vessel stenosis. Right carotid system: No evidence of dissection, stenosis (50% or greater) or occlusion. Calcific plaque of a moderate nature. Left carotid system: Calcific and noncalcified plaque at the bifurcation, with a 50% stenosis based on luminal measurements of 1.9/3.9 proximal/ distal. No flow-limiting stenosis or dissection. Vertebral arteries: BILATERAL patent. No ostial narrowing of significance. Skeleton: Spondylosis.  Poor dentition. Other neck: Subcentimeter thyroid nodule.   No adenopathy. Upper chest: Pathologic mediastinal adenopathy is incompletely evaluated. CT of the chest contrast is recommended for further evaluation. No pneumothorax or visible nodule. Slight anterior pleural thickening on the LEFT. Review of the MIP images confirms the above findings CTA HEAD FINDINGS Anterior  circulation: Calcification of the cavernous internal carotid arteries consistent with cerebrovascular atherosclerotic disease. No signs of intracranial large vessel occlusion. No significant flow-limiting cavernous stenosis. M1 MCA branches widely patent. Mildly irregular BILATERAL A1 ACA segments. Posterior circulation: BILATERAL distal vertebral V4 segment calcifications, without flow-limiting stenosis. Basilar artery widely patent. No cerebellar branch occlusion. No PCA stenosis of significance. Venous sinuses: As permitted by contrast timing, patent. Anatomic variants: Fetal LEFT PCA. Delayed phase: Not performed. Review of the MIP images confirms the above findings IMPRESSION: Non flow-limiting intracranial and extracranial atherosclerotic disease as described. With regard to the clinical question, there is no basilar stenosis or dissection. No secondary signs of posterior circulation infarction. Non stenotic calcific atheromatous change of the distal vertebral arteries is observed. Pathologic mediastinal adenopathy is incompletely evaluated. CT of the chest with contrast is recommended for further evaluation. These results were communicated to stroke neurologist at 06/28/2017 11:09 amon 06/28/2017 by text page via the [facility] messaging system. Electronically Signed   By: Staci Righter M.D.   On: 06/28/2017 11:23   Mr Brain Wo Contrast  Result Date: 06/28/2017 CLINICAL DATA:  Follow-up suspected stroke. History of hypertension, hyperlipidemia and headache. EXAM: MRI HEAD WITHOUT CONTRAST TECHNIQUE: Multiplanar, multiecho pulse sequences of the brain and surrounding structures were obtained without intravenous contrast. COMPARISON:  CT HEAD June 28, 2017 at 1003 hours. MRI of the head report dated March 28, 2003 though images are not available for direct comparison. FINDINGS: Mildly motion degraded examination. BRAIN: Multiple subcentimeter foci of reduced diffusion LEFT cerebellum and superior cerebellar  artery territory. Corresponding low ADC values. No susceptibility artifact to suggest hemorrhage. Ventricles and sulci are normal for patient's age. Patchy supratentorial white matter FLAIR T2 hyperintensities compatible with moderate chronic small vessel ischemic disease, less than expected for age. Mildly prominent basal ganglia perivascular spaces associated with chronic small vessel ischemic disease. No midline shift, mass effect or masses. No abnormal extra-axial fluid collections. VASCULAR: Normal major intracranial vascular flow voids present at skull base. SKULL AND UPPER CERVICAL SPINE: No abnormal sellar expansion. No suspicious calvarial bone marrow signal. Craniocervical junction maintained. SINUSES/ORBITS: The mastoid air-cells and included paranasal sinuses are well-aerated. The included ocular globes and orbital contents are non-suspicious. Status post bilateral ocular lens implants. OTHER: None. IMPRESSION: 1. Mild motion degraded examination. 2. Multiple acute small LEFT cerebellar/SCA territory nonhemorrhagic infarcts. 3. Otherwise negative noncontrast MRI of the head for age. Electronically Signed   By: Elon Alas M.D.   On: 06/28/2017 19:54   Ct Head Code Stroke Wo Contrast  Result Date: 06/28/2017 CLINICAL DATA:  Code stroke. Woke up with RIGHT-sided weakness and slurred speech. EXAM: CT HEAD WITHOUT CONTRAST TECHNIQUE: Contiguous axial images were obtained from the base of the skull through the vertex without intravenous contrast. COMPARISON:  09/29/2008. FINDINGS: Brain: No evidence for acute infarction, hemorrhage, mass lesion, hydrocephalus, or extra-axial fluid. Generalized atrophy, not unexpected for age. Hypoattenuation of white matter representing small vessel disease. Vascular: Calcification of the cavernous internal carotid arteries consistent with cerebrovascular atherosclerotic disease. No signs of intracranial large vessel occlusion. Skull: Normal. Negative for fracture  or focal lesion. Sinuses/Orbits: No acute finding.  BILATERAL cataract extraction. Other: Compared with priors, there has been progressive cerebral volume loss and white matter hypoattenuation. ASPECTS [facility] Stroke Program Early CT Score) - Ganglionic level infarction (caudate, lentiform nuclei, internal capsule, insula, M1-M3 cortex): 7 - Supraganglionic infarction (M4-M6 cortex): 3 Total score (0-10 with 10 being normal): 10 IMPRESSION: 1. Atrophy and small vessel disease.  No acute intracranial findings 2. ASPECTS is 10. These results were communicated to stroke neurologist at 06/28/2017 10:07 amon 06/28/2017 by text page via the [facility] messaging system. Electronically Signed   By: Staci Righter M.D.   On: 06/28/2017 10:18        Scheduled Meds: .  stroke: mapping our early stages of recovery book   Does not apply Once  . aspirin  325 mg Oral Daily  . atorvastatin  20 mg Oral q1800  . enoxaparin (LOVENOX) injection  40 mg Subcutaneous Q24H  . ferrous sulfate  325 mg Oral Q breakfast  . losartan  50 mg Oral Daily  . multivitamin with minerals  1 tablet Oral Daily  . nitrofurantoin  100 mg Oral Q12H  . senna-docusate  1 tablet Oral BID  . cyanocobalamin  500 mcg Oral Daily   Continuous Infusions: . sodium chloride 50 mL/hr at 06/29/17 0202     LOS: 0 days    Time spent: 25 min    Geradine Girt, DO [facility] Pager [phone]  If 7PM-7AM, please contact night-coverage [email or web address] Password [facility] 06/29/2017,  1:55 PM

## 2017-06-29 NOTE — Evaluation (Signed)
Occupational Therapy Evaluation Patient Details Name: Sara Robertson MRN: [id number] DOB: 12-23-1917 Today's Date: 06/29/2017    History of Present Illness Patient is a 83 year old Caucasian female with past medical history significant for CAD, OSA, HTN and HL. Patient lives in an independent living facility. Presents with R sided weakness and slurred speech. MRI revealed Multiple acute small LEFT cerebellar/SCA territory nonhemorrhagic   Clinical Impression   This 83 yo female admitted with above presents to acute OT with decreased balance, decreased coordination of LUE all affecting her PLOF of being Mod I for all basic ADLs. She will benefit from acute OT with follow up OT on CIR to get back to her PLOF.    Follow Up Recommendations  CIR;Supervision/Assistance - 24 hour    Equipment Recommendations  Other (comment)(TBD at next venue)       Precautions / Restrictions Precautions Precautions: Fall Restrictions Weight Bearing Restrictions: No      Mobility Bed Mobility Overal bed mobility: Needs Assistance Bed Mobility: Supine to Sit;Rolling Rolling: Min assist   Supine to sit: Mod assist     General bed mobility comments: Pt up in recliner upon my entry  Transfers Overall transfer level: Needs assistance Equipment used: Rolling walker (2 wheeled) Transfers: Sit to/from Stand Sit to Stand: Min assist         General transfer comment: Worked with pt on sit to stand from recliner, with safe hand placement    Balance Overall balance assessment: Needs assistance Sitting-balance support: Feet supported;No upper extremity supported Sitting balance-Leahy Scale: Fair     Standing balance support: Bilateral upper extremity supported Standing balance-Leahy Scale: Poor Standing balance comment: pt requires physical assist and RW to maintain stability                           ADL either performed or assessed with clinical judgement   ADL Overall ADL's :  Needs assistance/impaired Eating/Feeding: Independent;Sitting   Grooming: Minimal assistance;Standing   Upper Body Bathing: Set up;Sitting   Lower Body Bathing: Minimal assistance;Sit to/from stand   Upper Body Dressing : Minimal assistance;Sitting   Lower Body Dressing: Minimal assistance;Sit to/from stand   Toilet Transfer: Minimal assistance;Ambulation;RW   Toileting- Clothing Manipulation and Hygiene: Moderate assistance Toileting - Clothing Manipulation Details (indicate cue type and reason): min A sit<>stand             Vision Baseline Vision/History: Wears glasses Wears Glasses: At all times Patient Visual Report: No change from baseline              Pertinent Vitals/Pain Pain Assessment: No/denies pain     Hand Dominance Right   Extremity/Trunk Assessment Upper Extremity Assessment Upper Extremity Assessment: LUE deficits/detail LUE Deficits / Details: when comparing LUE to RUE (LUE with decreased GM (finger to nose, rasing and lowering arms) and FM coordination (finger opposition) LUE Coordination: decreased gross motor;decreased fine motor     Communication Communication Communication: No difficulties   Cognition Arousal/Alertness: Awake/alert Behavior During Therapy: WFL for tasks assessed/performed Overall Cognitive Status: Within Functional Limits for tasks assessed                                                Home Living Family/patient expects to be discharged to:: Other (Comment)(Independent living (Home place) since March)  Type of Home: (Independent living)                       Home Equipment: Walker - 4 wheels   Additional Comments: 2nd floor apartment but has elevator, she goes to [employer] 2x/day, only has fridge, no microwave or stove      Prior Functioning/Environment Level of Independence: Independent with assistive device(s)        Comments: uses 4ww, walking shower with seat, hand held  shower and grabbars, pt dresses/baths self, amb to dinning hall on own, has toliet riser and grab bars around [employer]; does her own meds        OT Problem List: Decreased strength;Decreased cognition;Impaired balance (sitting and/or standing);Impaired UE functional use      OT Treatment/Interventions: Self-care/ADL training;Balance training;Therapeutic activities;DME and/or AE instruction;Patient/family education    OT Goals(Current goals can be found in the care plan section) Acute Rehab OT Goals Patient Stated Goal: to go home, but I do know I am not safe to do that right now OT Goal Formulation: With patient Time For Goal Achievement: 07/13/17 Potential to Achieve Goals: Good  OT Frequency: Min 3X/week   Barriers to D/C: Decreased caregiver support             AM-PAC PT "6 Clicks" Daily Activity     Outcome Measure Help from another person eating meals?: None Help from another person taking care of personal grooming?: A Little Help from another person toileting, which includes using toliet, bedpan, or urinal?: A Lot Help from another person bathing (including washing, rinsing, drying)?: A Little Help from another person to put on and taking off regular upper body clothing?: A Little Help from another person to put on and taking off regular lower body clothing?: A Little 6 Click Score: 18   End of Session Equipment Utilized During Treatment: Gait belt;Rolling walker  Activity Tolerance: Patient tolerated treatment well Patient left: in chair;with call bell/phone within reach;with family/visitor present  OT Visit Diagnosis: Unsteadiness on feet (R26.81);Muscle weakness (generalized) (M62.81)                Time: 2426-8341 OT Time Calculation (min): 25 min Charges:  OT General Charges $OT Visit: 1 Visit OT Evaluation $OT Eval Moderate Complexity: 1 Mod OT Treatments $Self Care/Home Management : 8-22 mins G-Codes: OT G-codes **NOT FOR INPATIENT CLASS** Functional  Assessment Tool Used: Clinical judgement Functional Limitation: Self care Self Care Current Status (D6222): At least 40 percent but less than 60 percent impaired, limited or restricted Self Care Goal Status (L7989): At least 1 percent but less than 20 percent impaired, limited or restricted   [address], OTR/L [phone] 06/29/2017

## 2017-06-29 NOTE — Evaluation (Addendum)
Physical Therapy Evaluation Patient Details Name: Sara Robertson MRN: [id number] DOB: 16-Aug-1917 Today's Date: 06/29/2017   History of Present Illness  Patient is a 83 year old Caucasian female with past medical history significant for CAD, OSA, HTN and HL. Patient lives in an independent living facility. Presents with R sided weakness and slurred speech. MRI revealed Multiple acute small LEFT cerebellar/SCA territory nonhemorrhagic  Clinical Impression  Pt indep and living in ILF PTA. Pt now requires modA for safe OOB mobility and ambulation. Pt to benefit from CIR upon d/c to achieve safe mod I level of function as pt was PTA. Pt and dtr agreeable and dtr reports they are looking to hire someone to stay with the patient upon d/c. Acute PT to con't to follow.    Follow Up Recommendations CIR    Equipment Recommendations  None recommended by PT    Recommendations for Other Services Rehab consult     Precautions / Restrictions Precautions Precautions: Fall Restrictions Weight Bearing Restrictions: No      Mobility  Bed Mobility Overal bed mobility: Needs Assistance Bed Mobility: Supine to Sit;Rolling Rolling: Min assist   Supine to sit: Mod assist     General bed mobility comments: increased effort/time, labored, pt dependent on bed rail to roll, modA for trunk elevation  Transfers Overall transfer level: Needs assistance Equipment used: Rolling walker (2 wheeled) Transfers: Sit to/from Stand Sit to Stand: Mod assist         General transfer comment: pt unable to push up from bed, modA for initial power up and to steady pt during transition of hands  Ambulation/Gait Ambulation/Gait assistance: Mod assist;+2 safety/equipment Ambulation Distance (Feet): 25 Feet(x2) Assistive device: Rolling walker (2 wheeled) Gait Pattern/deviations: Step-to pattern;Decreased stride length;Wide base of support;Staggering left;Staggering right Gait velocity: slow Gait velocity  interpretation: Below normal speed for age/gender General Gait Details: pt very unsteady, c/o dizziness. BP 140/52. Pt very shaky with difficulty with walker management. modA to stabilize pt and to assist with walker management. pt with noted impaired sequencing of gait pattern   Stairs            Wheelchair Mobility    Modified Rankin (Stroke Patients Only)       Balance Overall balance assessment: Needs assistance Sitting-balance support: Feet supported;No upper extremity supported Sitting balance-Leahy Scale: Fair     Standing balance support: Bilateral upper extremity supported Standing balance-Leahy Scale: Poor Standing balance comment: pt requires physical assist to maintain stability                             Pertinent Vitals/Pain Pain Assessment: No/denies pain    Home Living Family/patient expects to be discharged to:: Assisted living(indep living, home [facility] of [facility], since march)     Type of Home: Assisted living         Home Equipment: Walker - 4 wheels Additional Comments: 2nd floor apartment but has elevator, she goes to [employer] 2x/day, only has fridge, no microwave or stove    Prior Function Level of Independence: Independent with assistive device(s)         Comments: uses 4ww, walking shower with seat, hand held shower and grabbars, pt dresses/baths self, amb to [employer] on own, has toliet riser and grab bars around [employer]   Dominant Hand: Right    Extremity/Trunk Assessment   Upper Extremity Assessment Upper Extremity Assessment: LUE deficits/detail LUE Deficits /  Details: grossly 4/5     Lower Extremity Assessment Lower Extremity Assessment: LLE deficits/detail LLE Deficits / Details: grossly 4/5 LLE Coordination: decreased gross motor    Cervical / Trunk Assessment Cervical / Trunk Assessment: Normal  Communication   Communication: No difficulties  Cognition Arousal/Alertness:  Awake/alert Behavior During Therapy: WFL for tasks assessed/performed Overall Cognitive Status: Within Functional Limits for tasks assessed                                        General Comments      Exercises     Assessment/Plan    PT Assessment Patient needs continued PT services  PT Problem List Decreased activity tolerance;Decreased balance;Decreased strength;Decreased mobility;Decreased coordination       PT Treatment Interventions DME instruction;Gait training;Stair training;Functional mobility training;Therapeutic activities;Therapeutic exercise;Balance training;Neuromuscular re-education    PT Goals (Current goals can be found in the Care Plan section)  Acute Rehab PT Goals Patient Stated Goal: home PT Goal Formulation: With patient Time For Goal Achievement: 07/06/17 Potential to Achieve Goals: Good    Frequency Min 4X/week   Barriers to discharge Decreased caregiver support lives alone however dtr reports they are looking to hire someone to provide assist    Co-evaluation               AM-PAC PT "6 Clicks" Daily Activity  Outcome Measure Difficulty turning over in bed (including adjusting bedclothes, sheets and blankets)?: Unable Difficulty moving from lying on back to sitting on the side of the bed? : Unable Difficulty sitting down on and standing up from a chair with arms (e.g., wheelchair, bedside commode, etc,.)?: Unable Help needed moving to and from a bed to chair (including a wheelchair)?: A Lot Help needed walking in hospital room?: A Lot Help needed climbing 3-5 steps with a railing? : Total 6 Click Score: 8    End of Session Equipment Utilized During Treatment: Gait belt Activity Tolerance: Patient tolerated treatment well Patient left: in chair;with call bell/phone within reach;with family/visitor present Nurse Communication: Mobility status PT Visit Diagnosis: Unsteadiness on feet (R26.81);Muscle weakness (generalized)  (M62.81);Dizziness and giddiness (R42)    Time: 6314-9702 PT Time Calculation (min) (ACUTE ONLY): 41 min   Charges:   PT Evaluation $PT Eval Moderate Complexity: 1 Mod PT Treatments $Gait Training: 8-22 mins $Therapeutic Activity: 8-22 mins   PT G Codes:   PT G-Codes **NOT FOR INPATIENT CLASS** Functional Assessment Tool Used: Clinical judgement Functional Limitation: Mobility: Walking and moving around Mobility: Walking and Moving Around Current Status (O3785): At least 40 percent but less than 60 percent impaired, limited or restricted Mobility: Walking and Moving Around Goal Status [phone]): At least 1 percent but less than 20 percent impaired, limited or restricted    Kittie Plater, PT, DPT Pager #: [phone] Office #: [phone]   [place] 06/29/2017, 2:23 PM

## 2017-06-29 NOTE — Evaluation (Signed)
Speech Language Pathology Evaluation Patient Details Name: Sara Robertson MRN: [id number] DOB: Oct 06, 1917 Today's Date: 06/29/2017 Time:  - 0370-4888    Problem List:  Patient Active Problem List   Diagnosis Date Noted  . Acute CVA (cerebrovascular accident) ([place]) 06/28/2017  . Iron deficiency anemia 06/29/2015   Past Medical History:  Past Medical History:  Diagnosis Date  . Anemia   . Angina pectoris ([place])   . CAD (coronary artery disease)   . GERD (gastroesophageal reflux disease)   . Headache   . Hyperlipemia   . Hypertension   . OSA (obstructive sleep apnea)   . SOB (shortness of breath)    Past Surgical History: History reviewed. No pertinent surgical history. HPI:  83 year old female admitted with possible CVA although unable to elaborate on symptoms. MRI with Multiple acute small LEFT cerebellar/SCA territory nonhemorrhagic   Assessment / Plan / Recommendation Clinical Impression  Patient presents with an ataxix dysarthria s/p left cerebellar infarct characterized by frequent sound substitutions, improving with cues for increased volume, slowed rate, and overarticulation. In general, cognition appears [facility] although speech intelligiblity and HOH impacting exam. Will benefit from skilled SLP services with focus on dysarthria. CIR recommended to maximize independence for return to ALF.     SLP Assessment  SLP Recommendation/Assessment: Patient needs continued Speech Lanaguage Pathology Services SLP Visit Diagnosis: Dysarthria and anarthria (R47.1)    Follow Up Recommendations  Inpatient Rehab    Frequency and Duration min 2x/week  2 weeks      SLP Evaluation Cognition  Overall Cognitive Status: Within Functional Limits for tasks assessed       Comprehension  Auditory Comprehension Overall Auditory Comprehension: Appears within functional limits for tasks assessed Visual Recognition/Discrimination Discrimination: Within Function Limits Reading  Comprehension Reading Status: Within funtional limits    Expression Expression Primary Mode of Expression: Verbal Verbal Expression Overall Verbal Expression: Appears within functional limits for tasks assessed Written Expression Dominant Hand: Right   Oral / Motor  Motor Speech Overall Motor Speech: Impaired Respiration: Within functional limits Phonation: Normal Resonance: Within functional limits Articulation: Impaired Level of Impairment: Word Intelligibility: Intelligibility reduced Word: 50-74% accurate Phrase: 50-74% accurate Motor Planning: Witnin functional limits Motor Speech Errors: Inconsistent Effective Techniques: Slow rate;Increased vocal intensity;Over-articulate   GO          Functional Assessment Tool Used: skilled clinical judgement Functional Limitations: Motor speech Motor Speech Current Status [phone]): At least 40 percent but less than 60 percent impaired, limited or restricted Motor Speech Goal Status [phone]): At least 20 percent but less than 40 percent impaired, limited or restricted        [place], [place] [phone]  Gabriel Rainwater [place] 06/29/2017, 2:01 PM

## 2017-06-29 NOTE — Progress Notes (Signed)
  Echocardiogram 2D Echocardiogram has been performed.  Matilde Bash 06/29/2017, 10:16 AM

## 2017-06-29 NOTE — Consult Note (Signed)
Physical Medicine and Rehabilitation Consult   Reason for Consult: Functional deficits Referring Physician: Dr. Eliseo Squires   HPI: Sara Robertson is a 83 y.o. female with history of HTN, OSA, CAD, SOB, recent UTI who was admitted on 06/28/2017 after found with right sided weakness and facial droop. History taken from chart review and patient. Weakness resolved enroute to hospital but patient noted to be dysarthric.  CT negative for acute process.  CTA head/neck showed no large vessel occlusion, no basilar stenosis or dissection and evidence of pathologic mediastinal adenopathy.  MRI brain reviewed, showing multifocal left cerebellar CVA.  Per report, multiple acute small left cerebellar/SCA territory infarcts.  2 D echo showed EF 50-55% with no wall abnormality, moderate aortic stenosis, severely increased PAH and grade 2 diastolic dysfunction.   Telemetry showed intermittent A fib but family not interested in [facility] due to history of falls and patient's ASA increased to 325 mg daily.  Macrodantin added for UTI.  Patient with ataxic dysarthria, balance deficits and dizziness affecting mobility.  She lives in an  [facility] facility and was independent with walker PTA. She was able to shower with assistance. She walks to the dinning room with a couple rest breaks due to SOB. Family is planning to hire supervision after discharge. CIR recommended due to functional deficits.    Review of Systems  Constitutional: Negative for chills and fever.  HENT: Positive for hearing loss.   Eyes: Negative for blurred vision and double vision.  Respiratory: Positive for shortness of breath (with activity).   Cardiovascular: Negative for chest pain and palpitations.  Gastrointestinal: Negative for abdominal pain, heartburn and nausea.  Genitourinary: Negative for dysuria and urgency.  Musculoskeletal: Negative for back pain, joint pain and myalgias.  Neurological: Positive for sensory change (used to have  neuropathy BLE) and speech change.  Psychiatric/Behavioral: The patient is not nervous/anxious.   All other systems reviewed and are negative.    Past Medical History:  Diagnosis Date  . Anemia   . Angina pectoris ([place])   . CAD (coronary artery disease)   . GERD (gastroesophageal reflux disease)   . Headache   . Hyperlipemia   . Hypertension   . OSA (obstructive sleep apnea)   . SOB (shortness of breath)     Past Surgical History:  Procedure Laterality Date  . APPENDECTOMY    . BLADDER SUSPENSION    . CARDIAC CATHETERIZATION  2010  . CARPAL TUNNEL RELEASE Bilateral   . CHOLECYSTECTOMY    . LUMBAR LAMINECTOMY    . TONSILLECTOMY    . VAGINAL HYSTERECTOMY       Family History  Problem Relation Age of Onset  . Cancer Mother   . Cancer Sister   . Cancer Brother     Social History:  Widowed. Retired PT--was in [employer]. Independent with AD. Marland Kitchen  She reports that  has never smoked. she has never used smokeless tobacco. She reports that she does not drink alcohol. Her drug history is not on file.   Allergies  Allergen Reactions  . Celebrex [Celecoxib] Nausea And Vomiting  . Motrin [Ibuprofen] Nausea And Vomiting  . Nexium [Esomeprazole Magnesium] Nausea And Vomiting    Medications Prior to Admission  Medication Sig Dispense Refill  . ferrous sulfate 325 (65 FE) MG tablet Take 325 mg by mouth daily with breakfast.     . furosemide (LASIX) 40 MG tablet Take 20 mg by mouth daily.     Marland Kitchen losartan (  COZAAR) 50 MG tablet Take 50 mg by mouth daily.    . Multiple Vitamins-Minerals (MULTIVITAMIN WITH MINERALS) tablet Take 1 tablet by mouth daily.    . Multiple Vitamins-Minerals (PRESERVISION AREDS 2 PO) Take 1 capsule by mouth daily.    . nitrofurantoin (MACRODANTIN) 100 MG capsule Take 100 mg by mouth 2 (two) times daily.    Marland Kitchen tiZANidine (ZANAFLEX) 2 MG tablet Take 2 mg by mouth at bedtime.      Home: Home Living Family/patient expects to be discharged to:: Other  (Comment)(Independent living ([facility]) since March) Type of Home: (Independent living) Home Equipment: Environmental consultant - 4 wheels Additional Comments: 2nd floor apartment but has elevator, she goes to [employer] 2x/day, only has fridge, no microwave or stove  Functional History: Prior Function Level of Independence: Independent with assistive device(s) Comments: uses 4ww, walking shower with seat, hand held shower and grabbars, pt dresses/baths self, amb to [employer] on own, has toliet riser and grab bars around [employer]; does her own meds Functional Status:  Mobility: Bed Mobility Overal bed mobility: Needs Assistance Bed Mobility: Supine to Sit, Rolling Rolling: Min assist Supine to sit: Mod assist General bed mobility comments: Pt up in recliner upon my entry Transfers Overall transfer level: Needs assistance Equipment used: Rolling walker (2 wheeled) Transfers: Sit to/from Stand Sit to Stand: Min assist General transfer comment: Worked with pt on sit to stand from recliner, with safe hand placement Ambulation/Gait Ambulation/Gait assistance: Mod assist, +2 safety/equipment Ambulation Distance (Feet): 25 Feet(x2) Assistive device: Rolling walker (2 wheeled) Gait Pattern/deviations: Step-to pattern, Decreased stride length, Wide base of support, Staggering left, Staggering right General Gait Details: pt very unsteady, c/o dizziness. BP 140/52. Pt very shaky with difficulty with walker management. modA to stabilize pt and to assist with walker management. pt with noted impaired sequencing of gait pattern  Gait velocity: slow Gait velocity interpretation: Below normal speed for age/gender    ADL: ADL Overall ADL's : Needs assistance/impaired Eating/Feeding: Independent, Sitting Grooming: Minimal assistance, Standing Upper Body Bathing: Set up, Sitting Lower Body Bathing: Minimal assistance, Sit to/from stand Upper Body Dressing : Minimal assistance, Sitting Lower Body Dressing:  Minimal assistance, Sit to/from stand Toilet Transfer: Minimal assistance, Ambulation, RW Toileting- Clothing Manipulation and Hygiene: Moderate assistance Toileting - Clothing Manipulation Details (indicate cue type and reason): min A sit<>stand  Cognition: Cognition Overall Cognitive Status: Within Functional Limits for tasks assessed Orientation Level: Oriented X4 Cognition Arousal/Alertness: Awake/alert Behavior During Therapy: WFL for tasks assessed/performed Overall Cognitive Status: Within Functional Limits for tasks assessed  Blood pressure (!) 131/54, pulse 70, temperature 98.4 F (36.9 C), temperature source Oral, resp. rate 20, height 5\' 4"  (1.626 m), weight 86.7 kg (191 lb 2.2 oz), SpO2 99 %. Physical Exam  Nursing note and vitals reviewed. Constitutional: She is oriented to person, place, and time. She appears well-developed.  Obese  HENT:  Head: Normocephalic and atraumatic.  Mouth/Throat: Oropharynx is clear and moist.  Eyes: Conjunctivae and EOM are normal. Pupils are equal, round, and reactive to light.  Neck: Normal range of motion. Neck supple.  Cardiovascular: Normal rate and regular rhythm.  No murmur heard. Respiratory: Effort normal and breath sounds normal. No stridor. No respiratory distress. She has no wheezes.  GI: Soft. Bowel sounds are normal. She exhibits no distension. There is no tenderness.  Musculoskeletal: She exhibits no edema or tenderness.  Neurological: She is alert and oriented to person, place, and time.  Mild dysarthria with mild right facial weakness.  Able to answer orientation questions but had difficulty remembering some details from distant past as well as name of ALF.  Able to follow simple motor commands without difficulty.  Motor: RUE/RLE: 5/5 LUE/LLE: 4+/5 LUE ataxia HOH  Skin: Skin is warm and dry.  Psychiatric: She has a normal mood and affect. Her behavior is normal. Judgment and thought content normal. Cognition and memory  are impaired.    Results for orders placed or performed during the hospital encounter of 06/28/17 (from the past 24 hour(s))  CBC     Status: Abnormal   Collection Time: 06/28/17  5:44 PM  Result Value Ref Range   WBC 8.4 4.0 - 10.5 K/uL   RBC 3.70 (L) 3.87 - 5.11 MIL/uL   Hemoglobin 11.4 (L) 12.0 - 15.0 g/dL   HCT 34.8 (L) 36.0 - 46.0 %   MCV 94.1 78.0 - 100.0 fL   MCH 30.8 26.0 - 34.0 pg   MCHC 32.8 30.0 - 36.0 g/dL   RDW 13.2 11.5 - 15.5 %   Platelets 192 150 - 400 K/uL  Creatinine, serum     Status: None   Collection Time: 06/28/17  5:44 PM  Result Value Ref Range   Creatinine, Ser 0.76 0.44 - 1.00 mg/dL   GFR calc non Af Amer >60 >60 mL/min   GFR calc Af Amer >60 >60 mL/min  Magnesium     Status: None   Collection Time: 06/28/17  5:44 PM  Result Value Ref Range   Magnesium 1.9 1.7 - 2.4 mg/dL  Phosphorus     Status: None   Collection Time: 06/28/17  5:44 PM  Result Value Ref Range   Phosphorus 3.2 2.5 - 4.6 mg/dL  Lipid panel     Status: Abnormal   Collection Time: 06/28/17  5:44 PM  Result Value Ref Range   Cholesterol 202 (H) 0 - 200 mg/dL   Triglycerides 68 <150 mg/dL   HDL 45 >40 mg/dL   Total CHOL/HDL Ratio 4.5 RATIO   VLDL 14 0 - 40 mg/dL   LDL Cholesterol 143 (H) 0 - 99 mg/dL  Lipid panel     Status: Abnormal   Collection Time: 06/29/17  4:35 AM  Result Value Ref Range   Cholesterol 179 0 - 200 mg/dL   Triglycerides 71 <150 mg/dL   HDL 40 (L) >40 mg/dL   Total CHOL/HDL Ratio 4.5 RATIO   VLDL 14 0 - 40 mg/dL   LDL Cholesterol 125 (H) 0 - 99 mg/dL  Basic metabolic panel     Status: Abnormal   Collection Time: 06/29/17  4:35 AM  Result Value Ref Range   Sodium 135 135 - 145 mmol/L   Potassium 3.8 3.5 - 5.1 mmol/L   Chloride 103 101 - 111 mmol/L   CO2 27 22 - 32 mmol/L   Glucose, Bld 112 (H) 65 - 99 mg/dL   BUN 12 6 - 20 mg/dL   Creatinine, Ser 0.74 0.44 - 1.00 mg/dL   Calcium 8.6 (L) 8.9 - 10.3 mg/dL   GFR calc non Af Amer >60 >60 mL/min   GFR calc  Af Amer >60 >60 mL/min   Anion gap 5 5 - 15  CBC     Status: Abnormal   Collection Time: 06/29/17  4:35 AM  Result Value Ref Range   WBC 6.8 4.0 - 10.5 K/uL   RBC 3.31 (L) 3.87 - 5.11 MIL/uL   Hemoglobin 10.3 (L) 12.0 - 15.0 g/dL   HCT 31.0 (L)  36.0 - 46.0 %   MCV 93.7 78.0 - 100.0 fL   MCH 31.1 26.0 - 34.0 pg   MCHC 33.2 30.0 - 36.0 g/dL   RDW 13.3 11.5 - 15.5 %   Platelets 202 150 - 400 K/uL   Ct Angio Head W Or Wo Contrast  Result Date: 06/28/2017 CLINICAL DATA:  Patient woke up with slurred speech and RIGHT-sided weakness. Concern for basilar thrombosis. EXAM: CT ANGIOGRAPHY HEAD AND NECK TECHNIQUE: Multidetector CT imaging of the head and neck was performed using the standard protocol during bolus administration of intravenous contrast. Multiplanar CT image reconstructions and MIPs were obtained to evaluate the vascular anatomy. Carotid stenosis measurements (when applicable) are obtained utilizing NASCET criteria, using the distal internal carotid diameter as the denominator. CONTRAST:  7mL ISOVUE-370 IOPAMIDOL (ISOVUE-370) INJECTION 76% COMPARISON:  CT head earlier in the day. FINDINGS: CTA NECK FINDINGS Aortic arch: Atherosclerosis. Conventional branching of the great vessels from the arch. No proximal great vessel stenosis. Right carotid system: No evidence of dissection, stenosis (50% or greater) or occlusion. Calcific plaque of a moderate nature. Left carotid system: Calcific and noncalcified plaque at the bifurcation, with a 50% stenosis based on luminal measurements of 1.9/3.9 proximal/ distal. No flow-limiting stenosis or dissection. Vertebral arteries: BILATERAL patent. No ostial narrowing of significance. Skeleton: Spondylosis.  Poor dentition. Other neck: Subcentimeter thyroid nodule.   No adenopathy. Upper chest: Pathologic mediastinal adenopathy is incompletely evaluated. CT of the chest contrast is recommended for further evaluation. No pneumothorax or visible nodule. Slight  anterior pleural thickening on the LEFT. Review of the MIP images confirms the above findings CTA HEAD FINDINGS Anterior circulation: Calcification of the cavernous internal carotid arteries consistent with cerebrovascular atherosclerotic disease. No signs of intracranial large vessel occlusion. No significant flow-limiting cavernous stenosis. M1 MCA branches widely patent. Mildly irregular BILATERAL A1 ACA segments. Posterior circulation: BILATERAL distal vertebral V4 segment calcifications, without flow-limiting stenosis. Basilar artery widely patent. No cerebellar branch occlusion. No PCA stenosis of significance. Venous sinuses: As permitted by contrast timing, patent. Anatomic variants: Fetal LEFT PCA. Delayed phase: Not performed. Review of the MIP images confirms the above findings IMPRESSION: Non flow-limiting intracranial and extracranial atherosclerotic disease as described. With regard to the clinical question, there is no basilar stenosis or dissection. No secondary signs of posterior circulation infarction. Non stenotic calcific atheromatous change of the distal vertebral arteries is observed. Pathologic mediastinal adenopathy is incompletely evaluated. CT of the chest with contrast is recommended for further evaluation. These results were communicated to stroke neurologist at 06/28/2017 11:09 amon 06/28/2017 by text page via the [facility] messaging system. Electronically Signed   By: Staci Righter M.D.   On: 06/28/2017 11:23   Ct Angio Neck W Or Wo Contrast  Result Date: 06/28/2017 CLINICAL DATA:  Patient woke up with slurred speech and RIGHT-sided weakness. Concern for basilar thrombosis. EXAM: CT ANGIOGRAPHY HEAD AND NECK TECHNIQUE: Multidetector CT imaging of the head and neck was performed using the standard protocol during bolus administration of intravenous contrast. Multiplanar CT image reconstructions and MIPs were obtained to evaluate the vascular anatomy. Carotid stenosis measurements (when  applicable) are obtained utilizing NASCET criteria, using the distal internal carotid diameter as the denominator. CONTRAST:  71mL ISOVUE-370 IOPAMIDOL (ISOVUE-370) INJECTION 76% COMPARISON:  CT head earlier in the day. FINDINGS: CTA NECK FINDINGS Aortic arch: Atherosclerosis. Conventional branching of the great vessels from the arch. No proximal great vessel stenosis. Right carotid system: No evidence of dissection, stenosis (50% or greater) or occlusion.  Calcific plaque of a moderate nature. Left carotid system: Calcific and noncalcified plaque at the bifurcation, with a 50% stenosis based on luminal measurements of 1.9/3.9 proximal/ distal. No flow-limiting stenosis or dissection. Vertebral arteries: BILATERAL patent. No ostial narrowing of significance. Skeleton: Spondylosis.  Poor dentition. Other neck: Subcentimeter thyroid nodule.   No adenopathy. Upper chest: Pathologic mediastinal adenopathy is incompletely evaluated. CT of the chest contrast is recommended for further evaluation. No pneumothorax or visible nodule. Slight anterior pleural thickening on the LEFT. Review of the MIP images confirms the above findings CTA HEAD FINDINGS Anterior circulation: Calcification of the cavernous internal carotid arteries consistent with cerebrovascular atherosclerotic disease. No signs of intracranial large vessel occlusion. No significant flow-limiting cavernous stenosis. M1 MCA branches widely patent. Mildly irregular BILATERAL A1 ACA segments. Posterior circulation: BILATERAL distal vertebral V4 segment calcifications, without flow-limiting stenosis. Basilar artery widely patent. No cerebellar branch occlusion. No PCA stenosis of significance. Venous sinuses: As permitted by contrast timing, patent. Anatomic variants: Fetal LEFT PCA. Delayed phase: Not performed. Review of the MIP images confirms the above findings IMPRESSION: Non flow-limiting intracranial and extracranial atherosclerotic disease as described. With  regard to the clinical question, there is no basilar stenosis or dissection. No secondary signs of posterior circulation infarction. Non stenotic calcific atheromatous change of the distal vertebral arteries is observed. Pathologic mediastinal adenopathy is incompletely evaluated. CT of the chest with contrast is recommended for further evaluation. These results were communicated to stroke neurologist at 06/28/2017 11:09 amon 06/28/2017 by text page via the [facility] messaging system. Electronically Signed   By: Staci Righter M.D.   On: 06/28/2017 11:23   Mr Brain Wo Contrast  Result Date: 06/28/2017 CLINICAL DATA:  Follow-up suspected stroke. History of hypertension, hyperlipidemia and headache. EXAM: MRI HEAD WITHOUT CONTRAST TECHNIQUE: Multiplanar, multiecho pulse sequences of the brain and surrounding structures were obtained without intravenous contrast. COMPARISON:  CT HEAD June 28, 2017 at 1003 hours. MRI of the head report dated March 28, 2003 though images are not available for direct comparison. FINDINGS: Mildly motion degraded examination. BRAIN: Multiple subcentimeter foci of reduced diffusion LEFT cerebellum and superior cerebellar artery territory. Corresponding low ADC values. No susceptibility artifact to suggest hemorrhage. Ventricles and sulci are normal for patient's age. Patchy supratentorial white matter FLAIR T2 hyperintensities compatible with moderate chronic small vessel ischemic disease, less than expected for age. Mildly prominent basal ganglia perivascular spaces associated with chronic small vessel ischemic disease. No midline shift, mass effect or masses. No abnormal extra-axial fluid collections. VASCULAR: Normal major intracranial vascular flow voids present at skull base. SKULL AND UPPER CERVICAL SPINE: No abnormal sellar expansion. No suspicious calvarial bone marrow signal. Craniocervical junction maintained. SINUSES/ORBITS: The mastoid air-cells and included paranasal sinuses  are well-aerated. The included ocular globes and orbital contents are non-suspicious. Status post bilateral ocular lens implants. OTHER: None. IMPRESSION: 1. Mild motion degraded examination. 2. Multiple acute small LEFT cerebellar/SCA territory nonhemorrhagic infarcts. 3. Otherwise negative noncontrast MRI of the head for age. Electronically Signed   By: Elon Alas M.D.   On: 06/28/2017 19:54   Ct Head Code Stroke Wo Contrast  Result Date: 06/28/2017 CLINICAL DATA:  Code stroke. Woke up with RIGHT-sided weakness and slurred speech. EXAM: CT HEAD WITHOUT CONTRAST TECHNIQUE: Contiguous axial images were obtained from the base of the skull through the vertex without intravenous contrast. COMPARISON:  09/29/2008. FINDINGS: Brain: No evidence for acute infarction, hemorrhage, mass lesion, hydrocephalus, or extra-axial fluid. Generalized atrophy, not unexpected for age. Hypoattenuation of  white matter representing small vessel disease. Vascular: Calcification of the cavernous internal carotid arteries consistent with cerebrovascular atherosclerotic disease. No signs of intracranial large vessel occlusion. Skull: Normal. Negative for fracture or focal lesion. Sinuses/Orbits: No acute finding.  BILATERAL cataract extraction. Other: Compared with priors, there has been progressive cerebral volume loss and white matter hypoattenuation. ASPECTS [facility] Stroke Program Early CT Score) - Ganglionic level infarction (caudate, lentiform nuclei, internal capsule, insula, M1-M3 cortex): 7 - Supraganglionic infarction (M4-M6 cortex): 3 Total score (0-10 with 10 being normal): 10 IMPRESSION: 1. Atrophy and small vessel disease.  No acute intracranial findings 2. ASPECTS is 10. These results were communicated to stroke neurologist at 06/28/2017 10:07 amon 06/28/2017 by text page via the [facility] messaging system. Electronically Signed   By: Staci Righter M.D.   On: 06/28/2017 10:18    Assessment/Plan: Diagnosis: Multiple  left cerebellar CVA Labs and images independently reviewed.  Records reviewed and summated above. Stroke: Continue secondary stroke prophylaxis and Risk Factor Modification listed below:   Antiplatelet therapy:   Blood Pressure Management:  Continue current medication with prn's with permisive HTN per primary team Statin Agent:    1. Does the need for close, 24 hr/day medical supervision in concert with the patient's rehab needs make it unreasonable for this patient to be served in a less intensive setting? Yes  2. Co-Morbidities requiring supervision/potential complications: UTI (cont abx), A fib (no anticoag due to falls, monitor HR with increased mobility), PAH (recs per primary team),  diastolic dysfunction (monitor for signs/symptoms of fluid overload), HTN (monitor and provide prns in accordance with increased physical exertion and pain), OSA (monitor for daytime somnolence and energy), CAD (cont meds), SOB, ABLA (transfuse if necessary to ensure appropriate perfusion for increased activity tolerance) 3. Due to safety, disease management and patient education, does the patient require 24 hr/day rehab nursing? Yes 4. Does the patient require coordinated care of a physician, rehab nurse, PT (1-2 hrs/day, 5 days/week) and OT (1-2 hrs/day, 5 days/week) to address physical and functional deficits in the context of the above medical diagnosis(es)? Yes Addressing deficits in the following areas: balance, endurance, locomotion, strength, transferring, bathing, dressing, toileting and psychosocial support 5. Can the patient actively participate in an intensive therapy program of at least 3 hrs of therapy per day at least 5 days per week? Yes 6. The potential for patient to make measurable gains while on inpatient rehab is excellent 7. Anticipated functional outcomes upon discharge from inpatient rehab are supervision and min assist  with PT, supervision and min assist with OT, n/a with SLP. 8. Estimated  rehab length of stay to reach the above functional goals is: 16-19 days. 9. Anticipated D/C setting: Home 10. Anticipated post D/C treatments: HH therapy and Home excercise program 11. Overall Rehab/Functional Prognosis: good  RECOMMENDATIONS: This patient's condition is appropriate for continued rehabilitative care in the following setting: CIR Patient has agreed to participate in recommended program. Yes Note that insurance prior authorization may be required for reimbursement for recommended care.  Comment: Rehab Admissions Coordinator to follow up.  Delice Lesch, MD, ABPMR [place] [place], [place] 06/29/2017

## 2017-06-30 ENCOUNTER — Inpatient Hospital Stay (HOSPITAL_COMMUNITY)
Admission: RE | Admit: 2017-06-30 | Discharge: 2017-07-11 | DRG: 057 | Disposition: A | Discharge status: Home Health | Payer: Medicare Other | Source: Intra-hospital | Attending: Physical Medicine & Rehabilitation | Admitting: Physical Medicine & Rehabilitation

## 2017-06-30 ENCOUNTER — Encounter (HOSPITAL_COMMUNITY): Payer: Self-pay | Admitting: *Deleted

## 2017-06-30 ENCOUNTER — Other Ambulatory Visit: Payer: Self-pay

## 2017-06-30 DIAGNOSIS — I639 Cerebral infarction, unspecified: Secondary | ICD-10-CM

## 2017-06-30 DIAGNOSIS — I480 Paroxysmal atrial fibrillation: Secondary | ICD-10-CM | POA: Diagnosis present

## 2017-06-30 DIAGNOSIS — D649 Anemia, unspecified: Secondary | ICD-10-CM | POA: Diagnosis present

## 2017-06-30 DIAGNOSIS — I69393 Ataxia following cerebral infarction: Principal | ICD-10-CM

## 2017-06-30 DIAGNOSIS — Z9181 History of falling: Secondary | ICD-10-CM | POA: Diagnosis not present

## 2017-06-30 DIAGNOSIS — G4733 Obstructive sleep apnea (adult) (pediatric): Secondary | ICD-10-CM | POA: Diagnosis present

## 2017-06-30 DIAGNOSIS — I63012 Cerebral infarction due to thrombosis of left vertebral artery: Secondary | ICD-10-CM | POA: Diagnosis not present

## 2017-06-30 DIAGNOSIS — E785 Hyperlipidemia, unspecified: Secondary | ICD-10-CM | POA: Diagnosis present

## 2017-06-30 DIAGNOSIS — Z87440 Personal history of urinary (tract) infections: Secondary | ICD-10-CM

## 2017-06-30 DIAGNOSIS — I2510 Atherosclerotic heart disease of native coronary artery without angina pectoris: Secondary | ICD-10-CM | POA: Diagnosis present

## 2017-06-30 DIAGNOSIS — K219 Gastro-esophageal reflux disease without esophagitis: Secondary | ICD-10-CM | POA: Diagnosis present

## 2017-06-30 DIAGNOSIS — I10 Essential (primary) hypertension: Secondary | ICD-10-CM | POA: Diagnosis present

## 2017-06-30 DIAGNOSIS — Z792 Long term (current) use of antibiotics: Secondary | ICD-10-CM

## 2017-06-30 DIAGNOSIS — I5189 Other ill-defined heart diseases: Secondary | ICD-10-CM | POA: Diagnosis present

## 2017-06-30 DIAGNOSIS — N390 Urinary tract infection, site not specified: Secondary | ICD-10-CM | POA: Diagnosis present

## 2017-06-30 DIAGNOSIS — I69322 Dysarthria following cerebral infarction: Secondary | ICD-10-CM | POA: Diagnosis not present

## 2017-06-30 DIAGNOSIS — G2581 Restless legs syndrome: Secondary | ICD-10-CM | POA: Diagnosis present

## 2017-06-30 DIAGNOSIS — D509 Iron deficiency anemia, unspecified: Secondary | ICD-10-CM | POA: Diagnosis present

## 2017-06-30 DIAGNOSIS — N3941 Urge incontinence: Secondary | ICD-10-CM | POA: Diagnosis present

## 2017-06-30 DIAGNOSIS — R269 Unspecified abnormalities of gait and mobility: Secondary | ICD-10-CM | POA: Diagnosis not present

## 2017-06-30 DIAGNOSIS — I69398 Other sequelae of cerebral infarction: Secondary | ICD-10-CM | POA: Diagnosis not present

## 2017-06-30 HISTORY — DX: Cerebral infarction, unspecified: I63.9

## 2017-06-30 LAB — HEMOGLOBIN A1C
Hgb A1c MFr Bld: 5.4 % (ref 4.8–5.6)
Mean Plasma Glucose: 108 mg/dL

## 2017-06-30 MED ORDER — PROCHLORPERAZINE MALEATE 5 MG PO TABS
5.0000 mg | ORAL_TABLET | Freq: Four times a day (QID) | ORAL | Status: DC | PRN
Start: 2017-06-30 — End: 2017-07-11

## 2017-06-30 MED ORDER — PROCHLORPERAZINE 25 MG RE SUPP
12.5000 mg | Freq: Four times a day (QID) | RECTAL | Status: DC | PRN
  Filled 2017-06-30: qty 1

## 2017-06-30 MED ORDER — BISACODYL 10 MG RE SUPP: 10.0000 mg | Freq: Every day | RECTAL | Status: DC | PRN

## 2017-06-30 MED ORDER — POLYETHYLENE GLYCOL 3350 17 G PO PACK: 17.0000 g | PACK | Freq: Every day | ORAL | Status: DC | PRN

## 2017-06-30 MED ORDER — CYANOCOBALAMIN 500 MCG PO TABS: 500.0000 ug | ORAL_TABLET | Freq: Every day | ORAL | Status: AC

## 2017-06-30 MED ORDER — ASPIRIN 325 MG PO TABS
325.0000 mg | ORAL_TABLET | Freq: Every day | ORAL | Status: DC
  Administered 2017-06-30 – 2017-07-10 (×11): 325 mg via ORAL
  Filled 2017-06-30 (×11): qty 1

## 2017-06-30 MED ORDER — DIPHENHYDRAMINE HCL 12.5 MG/5ML PO ELIX: 12.5000 mg | ORAL_SOLUTION | Freq: Four times a day (QID) | ORAL | Status: DC | PRN

## 2017-06-30 MED ORDER — FUROSEMIDE 20 MG PO TABS
20.0000 mg | ORAL_TABLET | Freq: Every day | ORAL | Status: DC
  Administered 2017-06-30: 20 mg via ORAL
  Filled 2017-06-30: qty 1

## 2017-06-30 MED ORDER — FERROUS SULFATE 325 (65 FE) MG PO TABS
325.0000 mg | ORAL_TABLET | Freq: Every day | ORAL | Status: DC
  Administered 2017-07-01 – 2017-07-11 (×11): 325 mg via ORAL
  Filled 2017-06-30 (×11): qty 1

## 2017-06-30 MED ORDER — VITAMIN B-12 1000 MCG PO TABS
500.0000 ug | ORAL_TABLET | Freq: Every day | ORAL | Status: DC
  Administered 2017-07-01 – 2017-07-11 (×11): 500 ug via ORAL
  Filled 2017-06-30 (×11): qty 1

## 2017-06-30 MED ORDER — PROCHLORPERAZINE EDISYLATE 5 MG/ML IJ SOLN: 5.0000 mg | Freq: Four times a day (QID) | INTRAMUSCULAR | Status: DC | PRN

## 2017-06-30 MED ORDER — SENNOSIDES-DOCUSATE SODIUM 8.6-50 MG PO TABS
1.0000 | ORAL_TABLET | Freq: Two times a day (BID) | ORAL | Status: DC
  Administered 2017-06-30 – 2017-07-09 (×14): 1 via ORAL
  Filled 2017-06-30 (×18): qty 1

## 2017-06-30 MED ORDER — ASPIRIN 325 MG PO TABS: 325.0000 mg | ORAL_TABLET | Freq: Every day | ORAL | Status: AC

## 2017-06-30 MED ORDER — FLEET ENEMA 7-19 GM/118ML RE ENEM: 1.0000 | ENEMA | Freq: Once | RECTAL | Status: DC | PRN

## 2017-06-30 MED ORDER — FERROUS SULFATE 325 (65 FE) MG PO TABS: 325.0000 mg | ORAL_TABLET | Freq: Every day | ORAL | Status: DC

## 2017-06-30 MED ORDER — ENOXAPARIN SODIUM 30 MG/0.3ML ~~LOC~~ SOLN
30.0000 mg | SUBCUTANEOUS | Status: DC
  Administered 2017-06-30 – 2017-07-10 (×11): 30 mg via SUBCUTANEOUS
  Filled 2017-06-30 (×11): qty 0.3

## 2017-06-30 MED ORDER — NITROFURANTOIN MACROCRYSTAL 100 MG PO CAPS
100.0000 mg | ORAL_CAPSULE | Freq: Two times a day (BID) | ORAL | Status: AC
  Administered 2017-06-30 – 2017-07-02 (×4): 100 mg via ORAL
  Filled 2017-06-30 (×5): qty 1

## 2017-06-30 MED ORDER — GUAIFENESIN-DM 100-10 MG/5ML PO SYRP: 5.0000 mL | ORAL_SOLUTION | Freq: Four times a day (QID) | ORAL | Status: DC | PRN

## 2017-06-30 MED ORDER — ALUM & MAG HYDROXIDE-SIMETH 200-200-20 MG/5ML PO SUSP: 30.0000 mL | ORAL | Status: DC | PRN

## 2017-06-30 MED ORDER — LOSARTAN POTASSIUM 50 MG PO TABS
50.0000 mg | ORAL_TABLET | Freq: Every day | ORAL | Status: DC
  Administered 2017-07-01 – 2017-07-11 (×10): 50 mg via ORAL
  Filled 2017-06-30 (×11): qty 1

## 2017-06-30 MED ORDER — ATORVASTATIN CALCIUM 20 MG PO TABS
20.0000 mg | ORAL_TABLET | Freq: Every day | ORAL | Status: DC
  Administered 2017-06-30 – 2017-07-10 (×11): 20 mg via ORAL
  Filled 2017-06-30 (×11): qty 1

## 2017-06-30 MED ORDER — PROSIGHT PO TABS
ORAL_TABLET | Freq: Every day | ORAL | Status: DC
  Administered 2017-07-01: 1 via ORAL
  Administered 2017-07-02 – 2017-07-05 (×4): via ORAL
  Administered 2017-07-07 – 2017-07-11 (×5): 1 via ORAL
  Filled 2017-06-30 (×12): qty 1

## 2017-06-30 MED ORDER — TRAZODONE HCL 50 MG PO TABS: 25.0000 mg | ORAL_TABLET | Freq: Every evening | ORAL | Status: DC | PRN

## 2017-06-30 MED ORDER — ACETAMINOPHEN 325 MG PO TABS: 325.0000 mg | ORAL_TABLET | ORAL | Status: DC | PRN

## 2017-06-30 MED ORDER — ADULT MULTIVITAMIN W/MINERALS CH
1.0000 | ORAL_TABLET | Freq: Every day | ORAL | Status: DC
  Administered 2017-07-01 – 2017-07-10 (×10): 1 via ORAL
  Filled 2017-06-30 (×11): qty 1

## 2017-06-30 MED ORDER — ATORVASTATIN CALCIUM 20 MG PO TABS: 20.0000 mg | ORAL_TABLET | Freq: Every day | ORAL | Status: DC

## 2017-06-30 MED ORDER — FUROSEMIDE 20 MG PO TABS
20.0000 mg | ORAL_TABLET | Freq: Every day | ORAL | Status: DC
  Administered 2017-07-01 – 2017-07-11 (×11): 20 mg via ORAL
  Filled 2017-06-30 (×11): qty 1

## 2017-06-30 NOTE — Evaluation (Signed)
Clinical/Bedside Swallow Evaluation Patient Details  Name: Sara Robertson MRN: [id number] Date of Birth: November 18, 1917  Today's Date: 06/30/2017 Time: SLP Start Time (ACUTE ONLY): 1310 SLP Stop Time (ACUTE ONLY): 1320 SLP Time Calculation (min) (ACUTE ONLY): 10 min  Past Medical History:  Past Medical History:  Diagnosis Date  . Anemia   . Angina pectoris ([place])   . CAD (coronary artery disease)   . Diverticulosis   . GERD (gastroesophageal reflux disease)   . H/O carcinoid syndrome   . Headache   . History of tinnitus   . Hyperlipemia   . Hypertension   . OSA (obstructive sleep apnea)   . Peripheral neuropathy   . Pulmonary HTN ([place])    with noctural hypoxia?  . SOB (shortness of breath)    Past Surgical History:  Past Surgical History:  Procedure Laterality Date  . APPENDECTOMY    . BLADDER SUSPENSION    . CARDIAC CATHETERIZATION  2010  . CARPAL TUNNEL RELEASE Bilateral   . CHOLECYSTECTOMY    . LUMBAR LAMINECTOMY    . TONSILLECTOMY    . VAGINAL HYSTERECTOMY     HPI:  83 year old female admitted with possible CVA although unable to elaborate on symptoms. MRI with Multiple acute small LEFT cerebellar/SCA territory nonhemorrhagic   Assessment / Plan / Recommendation Clinical Impression  Pt and her daughter describe intermittent difficulty swallowing pills with water, leading to coughing.  Swallow assessment reveals adequate mastication, brisk swallow response, no s/s of aspiration.  Passed three oz water test without difficulty.  No overt signs of deficits - recommended to pt that she takes meds with purees instead of water.  No further needs identified - our services will sign off.  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  SLP Visit Diagnosis: Dysphagia, unspecified (R13.10)    Aspiration Risk  No limitations    Diet Recommendation   regular diet, thin liquids  Medication Administration: Whole meds with puree    Other  Recommendations Oral  Care Recommendations: Oral care BID   Follow up Recommendations Inpatient Rehab      Frequency and Duration            Prognosis        Swallow Study   General Date of Onset: 06/30/17 HPI: 83 year old female admitted with possible CVA although unable to elaborate on symptoms. MRI with Multiple acute small LEFT cerebellar/SCA territory nonhemorrhagic Type of Study: Bedside Swallow Evaluation Previous Swallow Assessment: no Diet Prior to this Study: Regular;Thin liquids Temperature Spikes Noted: No Respiratory Status: Room air History of Recent Intubation: No Behavior/Cognition: Alert;Cooperative;Pleasant mood Oral Cavity Assessment: Within Functional Limits Oral Care Completed by SLP: No Oral Cavity - Dentition: Adequate natural dentition Vision: Functional for self-feeding Self-Feeding Abilities: Able to feed self Patient Positioning: Upright in bed Baseline Vocal Quality: Normal Volitional Cough: Strong Volitional Swallow: Able to elicit    Oral/Motor/Sensory Function Overall Oral Motor/Sensory Function: Within functional limits   Ice Chips Ice chips: Within functional limits   Thin Liquid Thin Liquid: Within functional limits    Nectar Thick Nectar Thick Liquid: Not tested   Honey Thick Honey Thick Liquid: Not tested   Puree Puree: Within functional limits   Solid   GO            Sara Robertson 06/30/2017,1:27 PM

## 2017-06-30 NOTE — H&P (Deleted)
  The note originally documented on this encounter has been moved the the encounter in which it belongs.  

## 2017-06-30 NOTE — PMR Pre-admission (Signed)
PMR Admission Coordinator Pre-Admission Assessment  Patient: Sara Robertson is an 83 y.o., female MRN: [id number] DOB: 10-25-1917 Height: 5\' 4"  (162.6 cm) Weight: 86.7 kg (191 lb 2.2 oz)              Insurance Information HMO:     PPO:      PCP:      IPA:      80/20:      OTHER:  PRIMARY: Medicare A & B      Policy#: [id number]      Subscriber: Self CM Name:       Phone#:      Fax#:  Pre-Cert#: Eligible       Employer: Retired  Benefits:  Phone #: Verified online      Name: Passport One Eff. Date: 10/26/1984     Deduct: $1340      Out of Pocket Max: N/A      Life Max: N/A CIR: 100%      SNF: 100% days 1-20; 80% days 21-100 Outpatient: 80%     Co-Pay: 20% Home Health: 100%      Co-Pay: none DME: 80%     Co-Pay: 20% Providers: Patient's Choice   SECONDARY: [facility]      Policy#: [id number]      Subscriber: Retired  [place] Name:       Phone#:      Fax#:  Pre-Cert#:       Employer:  Benefits:  Phone #: [phone]     Name:  Eff. Date:      Deduct:       Out of Pocket Max:       Life Max:  CIR:       SNF:  Outpatient:      Co-Pay:  Home Health:       Co-Pay:  DME:      Co-Pay:   Medicaid Application Date:       Case Manager:  Disability Application Date:       Case Worker:   Emergency Contact Information Contact Information    Name Relation Home Work [place] C Daughter [phone]    Brandt Loosen [phone]       Current Medical History  Patient Admitting Diagnosis: Multiple left cerebellar CVA  History of Present Illness: Sara Robertson a 83 y.o.femalewith history of HTN, OSA, CAD, SOB, recent UTI who was admitted on 06/28/2017 after found with right sided weakness and facial droop.History taken from chart review and patient.Weakness resolved enroute to hospital but patient noted to be dysarthric. CT negative for acute process. CTA head/neck showed no large vessel occlusion, no basilar stenosis or dissection and evidence of  pathologic mediastinal adenopathy. MRI brain reviewed, showing multifocal left cerebellar CVA. Per report, multiple acute small left cerebellar/SCA territory infarcts. 2 D echo showed EF 50-55% with no wall abnormality, moderate aortic stenosis, severely increased PAH and grade 2 diastolic dysfunction.   Telemetry showed intermittent A fib but family not interested in [facility] due to history of falls and patient's ASA increased to 325 mg daily. Macrodantin added for UTI. Patient with ataxic dysarthria, balance deficits and dizziness affecting mobility. She lives in [place] facility and was independent with walker PTA. She was able to shower with assistance. She walks to the dinning room with a couple rest breaks due to SOB.Family is planning to hire supervision after discharge. CIR recommended due to functional deficits and patient admitted 06/30/17.  NIH Total: 2  Past Medical History  Past Medical History:  Diagnosis Date  . Anemia   . Angina pectoris ([place])   . CAD (coronary artery disease)   . Diverticulosis   . GERD (gastroesophageal reflux disease)   . H/O carcinoid syndrome   . Headache   . History of tinnitus   . Hyperlipemia   . Hypertension   . OSA (obstructive sleep apnea)   . Peripheral neuropathy   . Pulmonary HTN ([place])    with noctural hypoxia?  . SOB (shortness of breath)     Family History  family history includes Cancer in her brother, mother, and sister.  Prior Rehab/Hospitalizations:  Has the patient had major surgery during 100 days prior to admission? No  Current Medications   Current Facility-Administered Medications:  .   stroke: mapping our early stages of recovery book, , Does not apply, Once, Dana Allan I, MD .  aspirin tablet 325 mg, 325 mg, Oral, Daily, Dana Allan I, MD .  atorvastatin (LIPITOR) tablet 20 mg, 20 mg, Oral, q1800, Costello, Mary A, NP .  enoxaparin (LOVENOX) injection 40 mg, 40 mg, Subcutaneous, Q24H,  Ogbata, Sylvester I, MD .  ferrous sulfate tablet 325 mg, 325 mg, Oral, Q breakfast, Dana Allan I, MD, 325 mg at 06/30/17 0841 .  furosemide (LASIX) tablet 20 mg, 20 mg, Oral, Daily, Vann, Jessica U, DO, 20 mg at 06/30/17 1254 .  labetalol (NORMODYNE,TRANDATE) injection 10 mg, 10 mg, Intravenous, Q2H PRN, Dana Allan I, MD .  losartan (COZAAR) tablet 50 mg, 50 mg, Oral, Daily, Dana Allan I, MD, 50 mg at 06/30/17 0841 .  multivitamin with minerals tablet 1 tablet, 1 tablet, Oral, Daily, Dana Allan I, MD, 1 tablet at 06/30/17 0841 .  nitrofurantoin (MACRODANTIN) capsule 100 mg, 100 mg, Oral, Q12H, Rise Patience, MD, 100 mg at 06/30/17 0841 .  senna-docusate (Senokot-S) tablet 1 tablet, 1 tablet, Oral, BID, Dana Allan I, MD, 1 tablet at 06/29/17 2341 .  vitamin B-12 (CYANOCOBALAMIN) tablet 500 mcg, 500 mcg, Oral, Daily, Dana Allan I, MD, 500 mcg at 06/30/17 4709  Facility-Administered Medications Ordered in Other Encounters:  .  0.9 %  sodium chloride infusion, 250 mL, Intravenous, Once, Corcoran, Terese Heier C, MD .  acetaminophen (TYLENOL) tablet 650 mg, 650 mg, Oral, Once, Corcoran, Nimo Verastegui C, MD .  diphenhydrAMINE (BENADRYL) capsule 25 mg, 25 mg, Oral, Once, Corcoran, Durante Violett C, MD .  heparin lock flush 100 unit/mL, 500 Units, Intracatheter, Daily PRN, Corcoran, Fernand Sorbello C, MD .  heparin lock flush 100 unit/mL, 250 Units, Intracatheter, PRN, Corcoran, Arnold Depinto C, MD .  sodium chloride 0.9 % injection 10 mL, 10 mL, Intracatheter, PRN, Corcoran, Joss Friedel C, MD .  sodium chloride 0.9 % injection 3 mL, 3 mL, Intracatheter, PRN, Lequita Asal, MD  Patients Current Diet: Diet Heart Room service appropriate? Yes; Fluid consistency: Thin Diet - low sodium heart healthy  Precautions / Restrictions Precautions Precautions: Fall Restrictions Weight Bearing Restrictions: No   Has the patient had 2 or more falls or a fall with injury in the past year?No,  1 fall right before this admission   Prior Activity Level Community (5-7x/wk): Prior to admission patient lived at an independent living facility.  She was independent with all self-care tasks, active winthin her community, and went out to eat with her daughter about once a week.    Home Assistive Devices / Equipment Home Assistive Devices/Equipment: Gilford Rile (specify type) Home Equipment: Walker - 4 wheels  Prior Device Use: Indicate  devices/aids used by the patient prior to current illness, exacerbation or injury? Walker  Prior Functional Level Prior Function Level of Independence: Independent with assistive device(s) Comments: uses 4ww, walking shower with seat, hand held shower and grabbars, pt dresses/baths self, amb to [employer] on own, has toliet riser and grab bars around [employer]; does her own meds  Self Care: Did the patient need help bathing, dressing, using the toilet or eating? Independent  Indoor Mobility: Did the patient need assistance with walking from room to room (with or without device)? Independent  Stairs: Did the patient need assistance with internal or external stairs (with or without device)? Dependent  Functional Cognition: Did the patient need help planning regular tasks such as shopping or remembering to take medications? Independent  Current Functional Level Cognition  Overall Cognitive Status: Within Functional Limits for tasks assessed Orientation Level: Oriented X4    Extremity Assessment (includes Sensation/Coordination)  Upper Extremity Assessment: LUE deficits/detail LUE Deficits / Details: when comparing LUE to RUE (LUE with decreased GM (finger to nose, rasing and lowering arms) and FM coordination (finger opposition) LUE Coordination: decreased gross motor, decreased fine motor  Lower Extremity Assessment: LLE deficits/detail LLE Deficits / Details: grossly 4/5 LLE Coordination: decreased gross motor    ADLs  Overall ADL's : Needs  assistance/impaired Eating/Feeding: Independent, Sitting Grooming: Minimal assistance, Standing Upper Body Bathing: Set up, Sitting Lower Body Bathing: Minimal assistance, Sit to/from stand Upper Body Dressing : Minimal assistance, Sitting Lower Body Dressing: Minimal assistance, Sit to/from stand Toilet Transfer: Minimal assistance, Ambulation, RW Toileting- Clothing Manipulation and Hygiene: Moderate assistance Toileting - Clothing Manipulation Details (indicate cue type and reason): min A sit<>stand    Mobility  Overal bed mobility: Needs Assistance Bed Mobility: Supine to Sit, Rolling Rolling: Min assist Supine to sit: Mod assist General bed mobility comments: Pt up in recliner upon my entry    Transfers  Overall transfer level: Needs assistance Equipment used: Rolling walker (2 wheeled) Transfers: Sit to/from Stand Sit to Stand: Min assist General transfer comment: Worked with pt on sit to stand from recliner, with safe hand placement    Ambulation / Gait / Stairs / Wheelchair Mobility  Ambulation/Gait Ambulation/Gait assistance: Mod assist, +2 safety/equipment Ambulation Distance (Feet): 25 Feet(x2) Assistive device: Rolling walker (2 wheeled) Gait Pattern/deviations: Step-to pattern, Decreased stride length, Wide base of support, Staggering left, Staggering right General Gait Details: pt very unsteady, c/o dizziness. BP 140/52. Pt very shaky with difficulty with walker management. modA to stabilize pt and to assist with walker management. pt with noted impaired sequencing of gait pattern  Gait velocity: slow Gait velocity interpretation: Below normal speed for age/gender    Posture / Balance Balance Overall balance assessment: Needs assistance Sitting-balance support: Feet supported, No upper extremity supported Sitting balance-Leahy Scale: Fair Standing balance support: Bilateral upper extremity supported Standing balance-Leahy Scale: Poor Standing balance comment: pt  requires physical assist and RW to maintain stability    Special needs/care consideration BiPAP/CPAP: No, but uses 2L nasal cannula at night  CPM: No Continuous Drip IV: No Dialysis: No         Life Vest: No Oxygen: 2L at night, see above  Special Bed: No Trach Size: No Wound Vac (area): No       Skin: Dry, Bruising to left arm                                Bowel mgmt:  Continent, last BM 06/30/17 Bladder mgmt: Continent, but urgency  Diabetic mgmt: No,HgbA1c goal < 7.0        Previous Home Environment Type of Home: (Independent living) [place] Name: [place]: No Additional Comments: 2nd floor apartment but has elevator, she goes to [employer] 2x/day, only has fridge, no microwave or stove  Discharge Living Setting Plans for Discharge Living Setting: Patient's home, Apartment Type of Home at Discharge: [employer] Name at Discharge: [place]: One level Discharge Home Access: Elevator, Level entry Discharge Bathroom Shower/Tub: Walk-in shower Discharge Bathroom Toilet: Handicapped height Discharge Bathroom Accessibility: Yes How Accessible: Accessible via walker Does the patient have any problems obtaining your medications?: No  Social/Family/Support Systems Patient Roles: Parent, Other (Comment)(Grandparent ) Contact Information: Daughter: Cherri Yera Anticipated Caregiver: Daughter arranging with family, hired help, and facility  Anticipated Caregiver's Contact Information: [phone] Ability/Limitations of Caregiver: Daughter, Zell works but is the only local daughter  Caregiver Availability: 24/7 Discharge Plan Discussed with Primary Caregiver: Yes Is Caregiver In Agreement with Plan?: Yes Does Caregiver/Family have Issues with Lodging/Transportation while Pt is in Rehab?: No  Goals/Additional Needs Patient/Family Goal for Rehab: OT/PT: Supervision-Min A   Expected length of stay: 16-19 days  Cultural Considerations: Catholic and requests an hour in the morning to do her prayers  Dietary Needs: Heart Healthy  Equipment Needs: TBD Special Service Needs: None Additional Information: None Pt/Family Agrees to Admission and willing to participate: Yes Program Orientation Provided & Reviewed with Pt/Caregiver Including Roles  & Responsibilities: Yes Additional Information Needs: Daughter reports that facility has their own version of life alert and different levels of care Information Needs to be Provided By: Team FYI   Decrease burden of Care through IP rehab admission: No  Possible need for SNF placement upon discharge: Not anticipated   Patient Condition: This patient's condition remains as documented in the consult dated 06/29/17, in which the Rehabilitation Physician determined and documented that the patient's condition is appropriate for intensive rehabilitative care in an inpatient rehabilitation facility. Will admit to inpatient rehab today.  Preadmission Screen Completed By:  Gunnar Fusi, 06/30/2017 12:56 PM ______________________________________________________________________   Discussed status with Dr. Naaman Plummer on 06/30/17 at 1322 and received telephone approval for admission today.  Admission Coordinator:  Gunnar Fusi, time 1322/Date 06/30/17

## 2017-06-30 NOTE — H&P (Signed)
Physical Medicine and Rehabilitation Admission H&P    Chief Complaint  Patient presents with  .  Functional deficits      HPI:  Sara Robertson is a 83 y.o. female with history of HTN, OSA, CAD, SOB, recent UTI who was admitted on 06/28/2017 after found with right sided weakness and facial droop. History taken from chart review and patient. Weakness resolved enroute to hospital but patient noted to be dysarthric.  CT negative for acute process.  CTA head/neck showed no large vessel occlusion, no basilar stenosis or dissection and evidence of pathologic mediastinal adenopathy.  MRI brain reviewed, showing multifocal left cerebellar CVA.  Per report, multiple acute small left cerebellar/SCA territory infarcts.  2 D echo showed EF 50-55% with no wall abnormality, moderate aortic stenosis, severely increased PAH and grade 2 diastolic dysfunction.   Telemetry showed intermittent A fib but family not interested in [facility] due to history of falls and patient's ASA increased to 325 mg daily.  Macrodantin added for UTI.  Patient with ataxic dysarthria, balance deficits and dizziness affecting mobility.  She lives in an   [place] and was independent with walker PTA. She was able to shower with assistance. She walks to the dinning room with a couple rest breaks due to SOB. Family is planning to hire supervision after discharge. CIR recommended due to functional deficits   Review of Systems  Constitutional: Negative for diaphoresis.  HENT: Positive for hearing loss. Negative for tinnitus.   Eyes: Negative for blurred vision and double vision.  Respiratory: Negative for cough and shortness of breath.   Cardiovascular: Negative for chest pain and palpitations.  Gastrointestinal: Negative for constipation, heartburn and nausea.  Genitourinary: Negative for dysuria and urgency.  Musculoskeletal: Negative for joint pain and myalgias.  Neurological: Positive for dizziness (with activity  since stroke), sensory change and speech change. Negative for weakness and headaches.  Psychiatric/Behavioral: Positive for memory loss. The patient is not nervous/anxious and does not have insomnia.       Past Medical History:  Diagnosis Date  . Anemia   . Angina pectoris ([place])   . CAD (coronary artery disease)   . Diverticulosis   . GERD (gastroesophageal reflux disease)   . H/O carcinoid syndrome   . Headache   . History of tinnitus   . Hyperlipemia   . Hypertension   . OSA (obstructive sleep apnea)   . Peripheral neuropathy   . Pulmonary HTN ([place])    with noctural hypoxia?  . SOB (shortness of breath)     Past Surgical History:  Procedure Laterality Date  . APPENDECTOMY    . BLADDER SUSPENSION    . CARDIAC CATHETERIZATION  2010  . CARPAL TUNNEL RELEASE Bilateral   . CHOLECYSTECTOMY    . LUMBAR LAMINECTOMY    . TONSILLECTOMY    . VAGINAL HYSTERECTOMY      Family History  Problem Relation Age of Onset  . Cancer Mother   . Cancer Sister   . Cancer Brother     Social History:  Widowed. Retired PT--was in [employer]. Independent with AD. Marland Kitchen  She reports that  has never smoked. she has never used smokeless tobacco. She reports that she does not drink alcohol. Her drug history is not on file.   Allergies  Allergen Reactions  . Celebrex [Celecoxib] Nausea And Vomiting  . Motrin [Ibuprofen] Nausea And Vomiting  . Nexium [Esomeprazole Magnesium] Nausea And Vomiting    Medications Prior to Admission  Medication Sig Dispense Refill  . ferrous sulfate 325 (65 FE) MG tablet Take 325 mg by mouth daily with breakfast.     . furosemide (LASIX) 40 MG tablet Take 20 mg by mouth daily.     Marland Kitchen losartan (COZAAR) 50 MG tablet Take 50 mg by mouth daily.    . Multiple Vitamins-Minerals (MULTIVITAMIN WITH MINERALS) tablet Take 1 tablet by mouth daily.    . Multiple Vitamins-Minerals (PRESERVISION AREDS 2 PO) Take 1 capsule by mouth daily.    . nitrofurantoin (MACRODANTIN) 100 MG  capsule Take 100 mg by mouth 2 (two) times daily.    Marland Kitchen tiZANidine (ZANAFLEX) 2 MG tablet Take 2 mg by mouth at bedtime.      Drug Regimen Review  Drug regimen was reviewed and remains appropriate with no significant issues identified  Home: Home Living Family/patient expects to be discharged to:: Other (Comment)(Independent living (Home place) since March) Type of Home: (Independent living) Home Equipment: Environmental consultant - 4 wheels Additional Comments: 2nd floor apartment but has elevator, she goes to [employer] 2x/day, only has fridge, no microwave or stove   Functional History: Prior Function Level of Independence: Independent with assistive device(s) Comments: uses 4ww, walking shower with seat, hand held shower and grabbars, pt dresses/baths self, amb to [employer] on own, has toliet riser and grab bars around [employer]; does her own meds  Functional Status:  Mobility: Bed Mobility Overal bed mobility: Needs Assistance Bed Mobility: Supine to Sit, Rolling Rolling: Min assist Supine to sit: Mod assist General bed mobility comments: Pt up in recliner upon my entry Transfers Overall transfer level: Needs assistance Equipment used: Rolling walker (2 wheeled) Transfers: Sit to/from Stand Sit to Stand: Min assist General transfer comment: Worked with pt on sit to stand from recliner, with safe hand placement Ambulation/Gait Ambulation/Gait assistance: Mod assist, +2 safety/equipment Ambulation Distance (Feet): 25 Feet(x2) Assistive device: Rolling walker (2 wheeled) Gait Pattern/deviations: Step-to pattern, Decreased stride length, Wide base of support, Staggering left, Staggering right General Gait Details: pt very unsteady, c/o dizziness. BP 140/52. Pt very shaky with difficulty with walker management. modA to stabilize pt and to assist with walker management. pt with noted impaired sequencing of gait pattern  Gait velocity: slow Gait velocity interpretation: Below normal speed for  age/gender    ADL: ADL Overall ADL's : Needs assistance/impaired Eating/Feeding: Independent, Sitting Grooming: Minimal assistance, Standing Upper Body Bathing: Set up, Sitting Lower Body Bathing: Minimal assistance, Sit to/from stand Upper Body Dressing : Minimal assistance, Sitting Lower Body Dressing: Minimal assistance, Sit to/from stand Toilet Transfer: Minimal assistance, Ambulation, RW Toileting- Clothing Manipulation and Hygiene: Moderate assistance Toileting - Clothing Manipulation Details (indicate cue type and reason): min A sit<>stand  Cognition: Cognition Overall Cognitive Status: Within Functional Limits for tasks assessed Orientation Level: Oriented X4 Cognition Arousal/Alertness: Awake/alert Behavior During Therapy: WFL for tasks assessed/performed Overall Cognitive Status: Within Functional Limits for tasks assessed   Blood pressure (!) 166/71, pulse 69, temperature 97.6 F (36.4 C), temperature source Oral, resp. rate 20, height '5\' 4"'$  (1.626 m), weight 86.7 kg (191 lb 2.2 oz), SpO2 96 %. Physical Exam  Nursing note and vitals reviewed. Constitutional: She is oriented to person, place, and time. She appears well-developed and well-nourished. No distress.  HENT:  Head: Normocephalic and atraumatic.  Mouth/Throat: Oropharynx is clear and moist.  Eyes: Conjunctivae and EOM are normal. Pupils are equal, round, and reactive to light. Right eye exhibits no discharge. Left eye exhibits no discharge.  Neck: Normal range  of motion. Neck supple.  Cardiovascular: Normal rate. An irregular rhythm present.  Respiratory: Effort normal and breath sounds normal. No stridor. No respiratory distress.  GI: Soft. Bowel sounds are normal. She exhibits no distension. There is no tenderness.  Musculoskeletal: She exhibits no edema or tenderness.  Neurological: She is alert and oriented to person, place, and time.  HOH. Moderate to severe dysarthria depending on rate of speech.   Mild left limb ataxia.  Reasonable sitting balance.  Strength grossly 4 out of 5 left upper and left lower extremity in 4 to 4+ out of 5 right upper and right lower extremity.  No sensory findings were noted.  Reflexes are 1+  Skin: Skin is warm and dry. No erythema.  Psychiatric: She has a normal mood and affect. Her behavior is normal. Thought content normal.    Results for orders placed or performed during the hospital encounter of 06/28/17 (from the past 48 hour(s))  Urine rapid drug screen (hosp performed)     Status: None   Collection Time: 06/28/17 11:48 AM  Result Value Ref Range   Opiates NONE DETECTED NONE DETECTED   Cocaine NONE DETECTED NONE DETECTED   Benzodiazepines NONE DETECTED NONE DETECTED   Amphetamines NONE DETECTED NONE DETECTED   Tetrahydrocannabinol NONE DETECTED NONE DETECTED   Barbiturates NONE DETECTED NONE DETECTED    Comment:        DRUG SCREEN FOR MEDICAL PURPOSES ONLY.  IF CONFIRMATION IS NEEDED FOR ANY PURPOSE, NOTIFY LAB WITHIN 5 DAYS.        LOWEST DETECTABLE LIMITS FOR URINE DRUG SCREEN Drug Class       Cutoff (ng/mL) Amphetamine      1000 Barbiturate      200 Benzodiazepine   191 Tricyclics       478 Opiates          300 Cocaine          300 THC              50   Urinalysis, Routine w reflex microscopic     Status: Abnormal   Collection Time: 06/28/17 11:48 AM  Result Value Ref Range   Color, Urine YELLOW YELLOW   APPearance HAZY (A) CLEAR   Specific Gravity, Urine 1.027 1.005 - 1.030   pH 6.0 5.0 - 8.0   Glucose, UA NEGATIVE NEGATIVE mg/dL   Hgb urine dipstick NEGATIVE NEGATIVE   Bilirubin Urine NEGATIVE NEGATIVE   Ketones, ur NEGATIVE NEGATIVE mg/dL   Protein, ur NEGATIVE NEGATIVE mg/dL   Nitrite NEGATIVE NEGATIVE   Leukocytes, UA NEGATIVE NEGATIVE  CBC     Status: Abnormal   Collection Time: 06/28/17  5:44 PM  Result Value Ref Range   WBC 8.4 4.0 - 10.5 K/uL   RBC 3.70 (L) 3.87 - 5.11 MIL/uL   Hemoglobin 11.4 (L) 12.0 - 15.0  g/dL   HCT 34.8 (L) 36.0 - 46.0 %   MCV 94.1 78.0 - 100.0 fL   MCH 30.8 26.0 - 34.0 pg   MCHC 32.8 30.0 - 36.0 g/dL   RDW 13.2 11.5 - 15.5 %   Platelets 192 150 - 400 K/uL  Creatinine, serum     Status: None   Collection Time: 06/28/17  5:44 PM  Result Value Ref Range   Creatinine, Ser 0.76 0.44 - 1.00 mg/dL   GFR calc non Af Amer >60 >60 mL/min   GFR calc Af Amer >60 >60 mL/min    Comment: (NOTE) The eGFR has been  calculated using the CKD EPI equation. This calculation has not been validated in all clinical situations. eGFR's persistently <60 mL/min signify possible Chronic Kidney Disease.   Magnesium     Status: None   Collection Time: 06/28/17  5:44 PM  Result Value Ref Range   Magnesium 1.9 1.7 - 2.4 mg/dL  Phosphorus     Status: None   Collection Time: 06/28/17  5:44 PM  Result Value Ref Range   Phosphorus 3.2 2.5 - 4.6 mg/dL  Lipid panel     Status: Abnormal   Collection Time: 06/28/17  5:44 PM  Result Value Ref Range   Cholesterol 202 (H) 0 - 200 mg/dL   Triglycerides 68 <150 mg/dL   HDL 45 >40 mg/dL   Total CHOL/HDL Ratio 4.5 RATIO   VLDL 14 0 - 40 mg/dL   LDL Cholesterol 143 (H) 0 - 99 mg/dL    Comment:        Total Cholesterol/HDL:CHD Risk Coronary Heart Disease Risk Table                     Men   Women  1/2 Average Risk   3.4   3.3  Average Risk       5.0   4.4  2 X Average Risk   9.6   7.1  3 X Average Risk  23.4   11.0        Use the calculated Patient Ratio above and the CHD Risk Table to determine the patient's CHD Risk.        ATP III CLASSIFICATION (LDL):  <100     mg/dL   Optimal  100-129  mg/dL   Near or Above                    Optimal  130-159  mg/dL   Borderline  160-189  mg/dL   High  >190     mg/dL   Very High   Hemoglobin A1c     Status: None   Collection Time: 06/29/17  4:35 AM  Result Value Ref Range   Hgb A1c MFr Bld 5.4 4.8 - 5.6 %    Comment: (NOTE)         Prediabetes: 5.7 - 6.4         Diabetes: >6.4         Glycemic  control for adults with diabetes: <7.0    Mean Plasma Glucose 108 mg/dL    Comment: (NOTE) Performed At: [facility] [place], [place] [id number] Rush Farmer MD [id number]   Lipid panel     Status: Abnormal   Collection Time: 06/29/17  4:35 AM  Result Value Ref Range   Cholesterol 179 0 - 200 mg/dL   Triglycerides 71 <150 mg/dL   HDL 40 (L) >40 mg/dL   Total CHOL/HDL Ratio 4.5 RATIO   VLDL 14 0 - 40 mg/dL   LDL Cholesterol 125 (H) 0 - 99 mg/dL    Comment:        Total Cholesterol/HDL:CHD Risk Coronary Heart Disease Risk Table                     Men   Women  1/2 Average Risk   3.4   3.3  Average Risk       5.0   4.4  2 X Average Risk   9.6   7.1  3 X Average Risk  23.4   11.0  Use the calculated Patient Ratio above and the CHD Risk Table to determine the patient's CHD Risk.        ATP III CLASSIFICATION (LDL):  <100     mg/dL   Optimal  100-129  mg/dL   Near or Above                    Optimal  130-159  mg/dL   Borderline  160-189  mg/dL   High  >190     mg/dL   Very High   Basic metabolic panel     Status: Abnormal   Collection Time: 06/29/17  4:35 AM  Result Value Ref Range   Sodium 135 135 - 145 mmol/L   Potassium 3.8 3.5 - 5.1 mmol/L   Chloride 103 101 - 111 mmol/L   CO2 27 22 - 32 mmol/L   Glucose, Bld 112 (H) 65 - 99 mg/dL   BUN 12 6 - 20 mg/dL   Creatinine, Ser 0.74 0.44 - 1.00 mg/dL   Calcium 8.6 (L) 8.9 - 10.3 mg/dL   GFR calc non Af Amer >60 >60 mL/min   GFR calc Af Amer >60 >60 mL/min    Comment: (NOTE) The eGFR has been calculated using the CKD EPI equation. This calculation has not been validated in all clinical situations. eGFR's persistently <60 mL/min signify possible Chronic Kidney Disease.    Anion gap 5 5 - 15  CBC     Status: Abnormal   Collection Time: 06/29/17  4:35 AM  Result Value Ref Range   WBC 6.8 4.0 - 10.5 K/uL   RBC 3.31 (L) 3.87 - 5.11 MIL/uL   Hemoglobin 10.3 (L) 12.0 - 15.0 g/dL   HCT  31.0 (L) 36.0 - 46.0 %   MCV 93.7 78.0 - 100.0 fL   MCH 31.1 26.0 - 34.0 pg   MCHC 33.2 30.0 - 36.0 g/dL   RDW 13.3 11.5 - 15.5 %   Platelets 202 150 - 400 K/uL   Dg Chest 2 View  Result Date: 06/30/2017 CLINICAL DATA:  CVA.  Shortness of breath. EXAM: CHEST  2 VIEW COMPARISON:  06/27/2015. FINDINGS: Cardiomegaly with diffuse bilateral pulmonary interstitial prominence and bilateral pleural effusions findings suggest CHF. Basilar pneumonia cannot be excluded . Elevation the right hemidiaphragm again noted. No pneumothorax. IMPRESSION: 1. Cardiomegaly with bilateral from interstitial prominence and bilateral pleural effusions. Findings suggest CHF. Basilar pneumonia cannot cannot be excluded. 2. Elevation the right hemidiaphragm again noted . Electronically Signed   By: Marcello Moores  Register   On: 06/30/2017 08:32   Mr Brain Wo Contrast  Result Date: 06/28/2017 CLINICAL DATA:  Follow-up suspected stroke. History of hypertension, hyperlipidemia and headache. EXAM: MRI HEAD WITHOUT CONTRAST TECHNIQUE: Multiplanar, multiecho pulse sequences of the brain and surrounding structures were obtained without intravenous contrast. COMPARISON:  CT HEAD June 28, 2017 at 1003 hours. MRI of the head report dated March 28, 2003 though images are not available for direct comparison. FINDINGS: Mildly motion degraded examination. BRAIN: Multiple subcentimeter foci of reduced diffusion LEFT cerebellum and superior cerebellar artery territory. Corresponding low ADC values. No susceptibility artifact to suggest hemorrhage. Ventricles and sulci are normal for patient's age. Patchy supratentorial white matter FLAIR T2 hyperintensities compatible with moderate chronic small vessel ischemic disease, less than expected for age. Mildly prominent basal ganglia perivascular spaces associated with chronic small vessel ischemic disease. No midline shift, mass effect or masses. No abnormal extra-axial fluid collections. VASCULAR: Normal  major intracranial vascular flow voids  present at skull base. SKULL AND UPPER CERVICAL SPINE: No abnormal sellar expansion. No suspicious calvarial bone marrow signal. Craniocervical junction maintained. SINUSES/ORBITS: The mastoid air-cells and included paranasal sinuses are well-aerated. The included ocular globes and orbital contents are non-suspicious. Status post bilateral ocular lens implants. OTHER: None. IMPRESSION: 1. Mild motion degraded examination. 2. Multiple acute small LEFT cerebellar/SCA territory nonhemorrhagic infarcts. 3. Otherwise negative noncontrast MRI of the head for age. Electronically Signed   By: Elon Alas M.D.   On: 06/28/2017 19:54       Medical Problem List and Plan: 1.  Functional and balance deficits secondary to left cerebellar infarct  -Admit to inpatient rehab 2.  DVT Prophylaxis/Anticoagulation: Pharmaceutical: Lovenox 3. Pain Management: N/A 4. Mood: LCSW to follow for evaluation and support.  5. Neuropsych: This patient is capable of making decisions on her own behalf. 6. Skin/Wound Care:  Routine pressure relief measures 7. Fluids/Electrolytes/Nutrition: Monitor I/O. Offer supplements if intake poor.  8. HTN: Monitor BP bid --continue Lasix and Cozaar daily.  9. Frequent UTIs: On macrodantin bid--question to be chronic per family. 10. Anemia: on iron supplement. 11. Dyslipidemia: On Lipitor. 12. RLS: Managed with tizanidine--currently on hold monitor and resume if needed.      Post Admission Physician Evaluation: 1. Functional deficits secondary  to left cerebellar infarct. 2. Patient is admitted to receive collaborative, interdisciplinary care between the physiatrist, rehab nursing staff, and therapy team. 3. Patient's level of medical complexity and substantial therapy needs in context of that medical necessity cannot be provided at a lesser intensity of care such as a SNF. 4. Patient has experienced substantial functional loss from his/her  baseline which was documented above under the "Functional History" and "Functional Status" headings.  Judging by the patient's diagnosis, physical exam, and functional history, the patient has potential for functional progress which will result in measurable gains while on inpatient rehab.  These gains will be of substantial and practical use upon discharge  in facilitating mobility and self-care at the household level. 5. Physiatrist will provide 24 hour management of medical needs as well as oversight of the therapy plan/treatment and provide guidance as appropriate regarding the interaction of the two. 6. The Preadmission Screening has been reviewed and patient status is unchanged unless otherwise stated above. 7. 24 hour rehab nursing will assist with bladder management, bowel management, safety, skin/wound care, disease management, medication administration and patient education  and help integrate therapy concepts, techniques,education, etc. 8. PT will assess and treat for/with: Lower extremity strength, range of motion, stamina, balance, functional mobility, safety, adaptive techniques and equipment, Neuromuscular reeducation, vestibular assessment and training, family education.   Goals are: Supervision to minimal assistance. 9. OT will assess and treat for/with: ADL's, functional mobility, safety, upper extremity strength, adaptive techniques and equipment neuromuscular reeducation, vestibular, assessment and training, family education.   Goals are: Supervision to minimal assistance. Therapy may proceed with showering this patient. 10. SLP will assess and treat for/with: N/A.  Goals are: N/A    11. Case Management and Social Worker will assess and treat for psychological issues and discharge planning. 12. Team conference will be held weekly to assess progress toward goals and to determine barriers to discharge. 13. Patient will receive at least 3 hours of therapy per day at least 5 days per  week. 14. ELOS:  16-19 days       15. Prognosis:  excellent     Meredith Staggers, MD, [place] [facility] 06/30/2017  Bary Leriche, [place] 06/30/2017

## 2017-06-30 NOTE — Discharge Summary (Signed)
Physician Discharge Summary  Sara Robertson:[id number] DOB: 04-21-1918 DOA: 06/28/2017  PCP: Idelle Crouch, MD  Admit date: 06/28/2017 Discharge date: 06/30/2017   Recommendations for Outpatient Follow-Up:   1. TO CIR 2. SLP to monitor swallow 3. DNR 4. Gradual reduction of BP To normal 5. If patient develops fever, would treat with CAP   Discharge Diagnosis:   Active Problems:   Acute CVA (cerebrovascular accident) ([place])   Acute lower UTI   PAF (paroxysmal atrial fibrillation) ([facility])   PAH (pulmonary artery hypertension) ([facility])   Benign essential HTN   Diastolic dysfunction   OSA (obstructive sleep apnea)   Coronary artery disease involving native coronary artery of native heart without angina pectoris   Acute blood loss anemia   Discharge disposition:  CIR  Discharge Condition: Improved.  Diet recommendation: cardiac  Wound care: None.   History of Present Illness:   Patient is a 83 year old Caucasian female with past medical history significant for CAD, OSA, HTN and HL. Patient lives in an independent living facility. Patient is a very poor historian. Patient was seen alongside patient's daughter and daughter in law. Patient reports that she just felt that she was having a stroke, but couldn't or wouldn't elaborate any further. Collateral information endorsed history of dysarthria, facial asymmetry and drooling of saliva. Patient is currently asymptomatic. CT Angio head and Neck is non revealing. Patient was on Aspirin 81mg  po once daily prior to admission. Patient will be admitted for further assessment and management. Neurology has seen patient and advised full dose aspirin. No headache, no neck pain, no fever or chills, no GI symptoms and no Urinary symptoms. Patient has full power in all extremities.     Hospital Course by Problem:    CVA -MRI shows multiple small acute CVAs in cerebellum -LDL: 125- statin HgbA1c: 5.4 -no h/o a fib-- follow tele  as it shows periods of a fib but family not interested in anticoagulation -neuro consult apprecaited -PT/OT/SLP-CIR -echo: - Left ventricle: The cavity size was normal. There was mild   concentric hypertrophy. Systolic function was normal. The   estimated ejection fraction was in the range of 50% to 55%. Wall   motion was normal; there were no regional wall motion   abnormalities. Features are consistent with a pseudonormal left   ventricular filling pattern, with concomitant abnormal relaxation   and increased filling pressure (grade 2 diastolic dysfunction).   Doppler parameters are consistent with indeterminate ventricular   filling pressure.  HTN -permissive HTN for now -gradual reduction to normal  pulm edema -resume lasix -doubt PNA as no fever/WBC count  HLD -on statin  incidental finding: Pathologic mediastinal adenopathy is incompletely evaluated. CT of the chest with contrast is recommended for further evaluation-- outpatient follow up       Medical Consultants:    Neuro  CIR   Discharge Exam:   Vitals:   06/30/17 0600 06/30/17 1000  BP: (!) 186/73 (!) 166/71  Pulse: 70 69  Resp: 16 20  Temp: 97.7 F (36.5 C) 97.6 F (36.4 C)  SpO2: 93% 96%   Vitals:   06/29/17 2152 06/29/17 2355 06/30/17 0600 06/30/17 1000  BP: (!) 176/68 (!) 152/62 (!) 186/73 (!) 166/71  Pulse:  70 70 69  Resp:  18 16 20   Temp:  97.9 F (36.6 C) 97.7 F (36.5 C) 97.6 F (36.4 C)  TempSrc:  Oral Oral Oral  SpO2:  96% 93% 96%  Weight:  Height:         The results of significant diagnostics from this hospitalization (including imaging, microbiology, ancillary and laboratory) are listed below for reference.     Procedures and Diagnostic Studies:   Ct Angio Head W Or Wo Contrast  Result Date: 06/28/2017 CLINICAL DATA:  Patient woke up with slurred speech and RIGHT-sided weakness. Concern for basilar thrombosis. EXAM: CT ANGIOGRAPHY HEAD AND NECK TECHNIQUE:  Multidetector CT imaging of the head and neck was performed using the standard protocol during bolus administration of intravenous contrast. Multiplanar CT image reconstructions and MIPs were obtained to evaluate the vascular anatomy. Carotid stenosis measurements (when applicable) are obtained utilizing NASCET criteria, using the distal internal carotid diameter as the denominator. CONTRAST:  41mL ISOVUE-370 IOPAMIDOL (ISOVUE-370) INJECTION 76% COMPARISON:  CT head earlier in the day. FINDINGS: CTA NECK FINDINGS Aortic arch: Atherosclerosis. Conventional branching of the great vessels from the arch. No proximal great vessel stenosis. Right carotid system: No evidence of dissection, stenosis (50% or greater) or occlusion. Calcific plaque of a moderate nature. Left carotid system: Calcific and noncalcified plaque at the bifurcation, with a 50% stenosis based on luminal measurements of 1.9/3.9 proximal/ distal. No flow-limiting stenosis or dissection. Vertebral arteries: BILATERAL patent. No ostial narrowing of significance. Skeleton: Spondylosis.  Poor dentition. Other neck: Subcentimeter thyroid nodule.   No adenopathy. Upper chest: Pathologic mediastinal adenopathy is incompletely evaluated. CT of the chest contrast is recommended for further evaluation. No pneumothorax or visible nodule. Slight anterior pleural thickening on the LEFT. Review of the MIP images confirms the above findings CTA HEAD FINDINGS Anterior circulation: Calcification of the cavernous internal carotid arteries consistent with cerebrovascular atherosclerotic disease. No signs of intracranial large vessel occlusion. No significant flow-limiting cavernous stenosis. M1 MCA branches widely patent. Mildly irregular BILATERAL A1 ACA segments. Posterior circulation: BILATERAL distal vertebral V4 segment calcifications, without flow-limiting stenosis. Basilar artery widely patent. No cerebellar branch occlusion. No PCA stenosis of significance. Venous  sinuses: As permitted by contrast timing, patent. Anatomic variants: Fetal LEFT PCA. Delayed phase: Not performed. Review of the MIP images confirms the above findings IMPRESSION: Non flow-limiting intracranial and extracranial atherosclerotic disease as described. With regard to the clinical question, there is no basilar stenosis or dissection. No secondary signs of posterior circulation infarction. Non stenotic calcific atheromatous change of the distal vertebral arteries is observed. Pathologic mediastinal adenopathy is incompletely evaluated. CT of the chest with contrast is recommended for further evaluation. These results were communicated to stroke neurologist at 06/28/2017 11:09 amon 06/28/2017 by text page via the [facility] messaging system. Electronically Signed   By: Staci Righter M.D.   On: 06/28/2017 11:23   Dg Chest 2 View  Result Date: 06/30/2017 CLINICAL DATA:  CVA.  Shortness of breath. EXAM: CHEST  2 VIEW COMPARISON:  06/27/2015. FINDINGS: Cardiomegaly with diffuse bilateral pulmonary interstitial prominence and bilateral pleural effusions findings suggest CHF. Basilar pneumonia cannot be excluded . Elevation the right hemidiaphragm again noted. No pneumothorax. IMPRESSION: 1. Cardiomegaly with bilateral from interstitial prominence and bilateral pleural effusions. Findings suggest CHF. Basilar pneumonia cannot cannot be excluded. 2. Elevation the right hemidiaphragm again noted . Electronically Signed   By: Marcello Moores  Register   On: 06/30/2017 08:32   Ct Angio Neck W Or Wo Contrast  Result Date: 06/28/2017 CLINICAL DATA:  Patient woke up with slurred speech and RIGHT-sided weakness. Concern for basilar thrombosis. EXAM: CT ANGIOGRAPHY HEAD AND NECK TECHNIQUE: Multidetector CT imaging of the head and neck was performed using the standard  protocol during bolus administration of intravenous contrast. Multiplanar CT image reconstructions and MIPs were obtained to evaluate the vascular anatomy. Carotid  stenosis measurements (when applicable) are obtained utilizing NASCET criteria, using the distal internal carotid diameter as the denominator. CONTRAST:  56mL ISOVUE-370 IOPAMIDOL (ISOVUE-370) INJECTION 76% COMPARISON:  CT head earlier in the day. FINDINGS: CTA NECK FINDINGS Aortic arch: Atherosclerosis. Conventional branching of the great vessels from the arch. No proximal great vessel stenosis. Right carotid system: No evidence of dissection, stenosis (50% or greater) or occlusion. Calcific plaque of a moderate nature. Left carotid system: Calcific and noncalcified plaque at the bifurcation, with a 50% stenosis based on luminal measurements of 1.9/3.9 proximal/ distal. No flow-limiting stenosis or dissection. Vertebral arteries: BILATERAL patent. No ostial narrowing of significance. Skeleton: Spondylosis.  Poor dentition. Other neck: Subcentimeter thyroid nodule.   No adenopathy. Upper chest: Pathologic mediastinal adenopathy is incompletely evaluated. CT of the chest contrast is recommended for further evaluation. No pneumothorax or visible nodule. Slight anterior pleural thickening on the LEFT. Review of the MIP images confirms the above findings CTA HEAD FINDINGS Anterior circulation: Calcification of the cavernous internal carotid arteries consistent with cerebrovascular atherosclerotic disease. No signs of intracranial large vessel occlusion. No significant flow-limiting cavernous stenosis. M1 MCA branches widely patent. Mildly irregular BILATERAL A1 ACA segments. Posterior circulation: BILATERAL distal vertebral V4 segment calcifications, without flow-limiting stenosis. Basilar artery widely patent. No cerebellar branch occlusion. No PCA stenosis of significance. Venous sinuses: As permitted by contrast timing, patent. Anatomic variants: Fetal LEFT PCA. Delayed phase: Not performed. Review of the MIP images confirms the above findings IMPRESSION: Non flow-limiting intracranial and extracranial  atherosclerotic disease as described. With regard to the clinical question, there is no basilar stenosis or dissection. No secondary signs of posterior circulation infarction. Non stenotic calcific atheromatous change of the distal vertebral arteries is observed. Pathologic mediastinal adenopathy is incompletely evaluated. CT of the chest with contrast is recommended for further evaluation. These results were communicated to stroke neurologist at 06/28/2017 11:09 amon 06/28/2017 by text page via the [facility] messaging system. Electronically Signed   By: Staci Righter M.D.   On: 06/28/2017 11:23   Mr Brain Wo Contrast  Result Date: 06/28/2017 CLINICAL DATA:  Follow-up suspected stroke. History of hypertension, hyperlipidemia and headache. EXAM: MRI HEAD WITHOUT CONTRAST TECHNIQUE: Multiplanar, multiecho pulse sequences of the brain and surrounding structures were obtained without intravenous contrast. COMPARISON:  CT HEAD June 28, 2017 at 1003 hours. MRI of the head report dated March 28, 2003 though images are not available for direct comparison. FINDINGS: Mildly motion degraded examination. BRAIN: Multiple subcentimeter foci of reduced diffusion LEFT cerebellum and superior cerebellar artery territory. Corresponding low ADC values. No susceptibility artifact to suggest hemorrhage. Ventricles and sulci are normal for patient's age. Patchy supratentorial white matter FLAIR T2 hyperintensities compatible with moderate chronic small vessel ischemic disease, less than expected for age. Mildly prominent basal ganglia perivascular spaces associated with chronic small vessel ischemic disease. No midline shift, mass effect or masses. No abnormal extra-axial fluid collections. VASCULAR: Normal major intracranial vascular flow voids present at skull base. SKULL AND UPPER CERVICAL SPINE: No abnormal sellar expansion. No suspicious calvarial bone marrow signal. Craniocervical junction maintained. SINUSES/ORBITS: The mastoid  air-cells and included paranasal sinuses are well-aerated. The included ocular globes and orbital contents are non-suspicious. Status post bilateral ocular lens implants. OTHER: None. IMPRESSION: 1. Mild motion degraded examination. 2. Multiple acute small LEFT cerebellar/SCA territory nonhemorrhagic infarcts. 3. Otherwise negative noncontrast MRI of the  head for age. Electronically Signed   By: Elon Alas M.D.   On: 06/28/2017 19:54   Ct Head Code Stroke Wo Contrast  Result Date: 06/28/2017 CLINICAL DATA:  Code stroke. Woke up with RIGHT-sided weakness and slurred speech. EXAM: CT HEAD WITHOUT CONTRAST TECHNIQUE: Contiguous axial images were obtained from the base of the skull through the vertex without intravenous contrast. COMPARISON:  09/29/2008. FINDINGS: Brain: No evidence for acute infarction, hemorrhage, mass lesion, hydrocephalus, or extra-axial fluid. Generalized atrophy, not unexpected for age. Hypoattenuation of white matter representing small vessel disease. Vascular: Calcification of the cavernous internal carotid arteries consistent with cerebrovascular atherosclerotic disease. No signs of intracranial large vessel occlusion. Skull: Normal. Negative for fracture or focal lesion. Sinuses/Orbits: No acute finding.  BILATERAL cataract extraction. Other: Compared with priors, there has been progressive cerebral volume loss and white matter hypoattenuation. ASPECTS [facility] Stroke Program Early CT Score) - Ganglionic level infarction (caudate, lentiform nuclei, internal capsule, insula, M1-M3 cortex): 7 - Supraganglionic infarction (M4-M6 cortex): 3 Total score (0-10 with 10 being normal): 10 IMPRESSION: 1. Atrophy and small vessel disease.  No acute intracranial findings 2. ASPECTS is 10. These results were communicated to stroke neurologist at 06/28/2017 10:07 amon 06/28/2017 by text page via the [facility] messaging system. Electronically Signed   By: Staci Righter M.D.   On: 06/28/2017 10:18      Labs:   Basic Metabolic Panel: Recent Labs  Lab 06/28/17 0955 06/28/17 1001 06/28/17 1744 06/29/17 0435  NA 135 138  --  135  K 3.8 3.9  --  3.8  CL 104 102  --  103  CO2 23  --   --  27  GLUCOSE 137* 140*  --  112*  BUN 15 15  --  12  CREATININE 0.83 0.80 0.76 0.74  CALCIUM 8.9  --   --  8.6*  MG  --   --  1.9  --   PHOS  --   --  3.2  --    GFR Estimated Creatinine Clearance: 42.8 mL/min (by C-G formula based on SCr of 0.74 mg/dL). Liver Function Tests: Recent Labs  Lab 06/28/17 0955  AST 28  ALT 27  ALKPHOS 61  BILITOT 0.8  PROT 6.2*  ALBUMIN 3.2*   No results for input(s): LIPASE, AMYLASE in the last 168 hours. No results for input(s): AMMONIA in the last 168 hours. Coagulation profile Recent Labs  Lab 06/28/17 0955  INR 1.03    CBC: Recent Labs  Lab 06/28/17 0955 06/28/17 1001 06/28/17 1744 06/29/17 0435  WBC 7.5  --  8.4 6.8  NEUTROABS 4.9  --   --   --   HGB 11.4* 11.6* 11.4* 10.3*  HCT 34.3* 34.0* 34.8* 31.0*  MCV 94.0  --  94.1 93.7  PLT 199  --  192 202   Cardiac Enzymes: No results for input(s): CKTOTAL, CKMB, CKMBINDEX, TROPONINI in the last 168 hours. BNP: Invalid input(s): POCBNP CBG: No results for input(s): GLUCAP in the last 168 hours. D-Dimer No results for input(s): DDIMER in the last 72 hours. Hgb A1c Recent Labs    06/29/17 0435  HGBA1C 5.4   Lipid Profile Recent Labs    06/28/17 1744 06/29/17 0435  CHOL 202* 179  HDL 45 40*  LDLCALC 143* 125*  TRIG 68 71  CHOLHDL 4.5 4.5   Thyroid function studies No results for input(s): TSH, T4TOTAL, T3FREE, THYROIDAB in the last 72 hours.  Invalid input(s): FREET3 Anemia work up No  results for input(s): VITAMINB12, FOLATE, FERRITIN, TIBC, IRON, RETICCTPCT in the last 72 hours. Microbiology No results found for this or any previous visit (from the past 240 hour(s)).   Discharge Instructions:   Discharge Instructions    Ambulatory referral to Neurology    Complete by:  As directed    An appointment is requested in approximately: 6 weeks Follow up with stroke clinic Cecille Rubin preferred, if not available, then consider Caesar Chestnut, [facility] or Jaynee Eagles whoever is available) at [facility] in about 6-8 weeks. Thanks.   Diet - low sodium heart healthy   Complete by:  As directed    Increase activity slowly   Complete by:  As directed      Allergies as of 06/30/2017      Reactions   Celebrex [celecoxib] Nausea And Vomiting   Motrin [ibuprofen] Nausea And Vomiting   Nexium [esomeprazole Magnesium] Nausea And Vomiting      Medication List    STOP taking these medications   tiZANidine 2 MG tablet Commonly known as:  ZANAFLEX     TAKE these medications   aspirin 325 MG tablet Take 1 tablet (325 mg total) by mouth daily.   atorvastatin 20 MG tablet Commonly known as:  LIPITOR Take 1 tablet (20 mg total) by mouth daily at 6 PM.   cyanocobalamin 500 MCG tablet Take 1 tablet (500 mcg total) by mouth daily. Start taking on:  07/01/2017   ferrous sulfate 325 (65 FE) MG tablet Take 325 mg by mouth daily with breakfast.   furosemide 40 MG tablet Commonly known as:  LASIX Take 20 mg by mouth daily.   losartan 50 MG tablet Commonly known as:  COZAAR Take 50 mg by mouth daily.   multivitamin with minerals tablet Take 1 tablet by mouth daily.   PRESERVISION AREDS 2 PO Take 1 capsule by mouth daily.   nitrofurantoin 100 MG capsule Commonly known as:  MACRODANTIN Take 100 mg by mouth 2 (two) times daily.      Follow-up Information    Dennie Bible, NP. Schedule an appointment as soon as possible for a visit in 6 week(s).   Specialty:  Family Medicine Contact information: [address] [place] [address] [phone]        Idelle Crouch, MD Follow up in 1 week(s).   Specialty:  Internal Medicine Contact information: [address] [address] [phone]            Time  coordinating discharge: 35 min  Signed:  Geradine Girt   [facility] 06/30/2017, 11:25 AM

## 2017-06-30 NOTE — Progress Notes (Signed)
Physical Therapy Treatment Patient Details Name: Sara Robertson MRN: [id number] DOB: [birth year] Today's Date: 06/30/2017    History of Present Illness Patient is a 83 year old Caucasian female with past medical history significant for CAD, OSA, HTN and HL. Patient lives in an independent living facility. Presents with R sided weakness and slurred speech. MRI revealed Multiple acute small LEFT cerebellar/SCA territory nonhemorrhagic    PT Comments    Pt demonstrates L sided neglect, weakness, and impaired sensation. Pt requiring modA for ambulation with RW. Pt at significant falls risk. Pt con't to be appropriate for CIR upon d/c.   Follow Up Recommendations  CIR     Equipment Recommendations  None recommended by PT    Recommendations for Other Services Rehab consult     Precautions / Restrictions Precautions Precautions: Fall Restrictions Weight Bearing Restrictions: No    Mobility  Bed Mobility Overal bed mobility: Needs Assistance Bed Mobility: Sit to Supine Rolling: Min guard     Sit to supine: Min assist   General bed mobility comments: pt with difficulty using L UE and LE functionally to assist with boosting up in bed  Transfers Overall transfer level: Needs assistance Equipment used: Rolling walker (2 wheeled) Transfers: Sit to/from Stand Sit to Stand: Min assist         General transfer comment: pt unsteady upon standing  Ambulation/Gait Ambulation/Gait assistance: Mod assist;+2 physical assistance Ambulation Distance (Feet): 25 Feet Assistive device: Rolling walker (2 wheeled) Gait Pattern/deviations: Step-through pattern;Decreased stride length;Staggering left;Narrow base of support Gait velocity: slow Gait velocity interpretation: Below normal speed for age/gender General Gait Details: pt with listing to the L, requiring modA for safe walker managment and to stay in midline. pt very unsteady. pt with noted increased pushing with R UE down on  RW   Stairs            Wheelchair Mobility    Modified Rankin (Stroke Patients Only) Modified Rankin (Stroke Patients Only) Pre-Morbid Rankin Score: No significant disability Modified Rankin: Moderately severe disability     Balance Overall balance assessment: Needs assistance Sitting-balance support: Feet supported Sitting balance-Leahy Scale: Fair     Standing balance support: Bilateral upper extremity supported Standing balance-Leahy Scale: Poor Standing balance comment: pt stood s/p using BSC, required support of RW and minA from PT to maintain balance                            Cognition Arousal/Alertness: Awake/alert Behavior During Therapy: WFL for tasks assessed/performed Overall Cognitive Status: Within Functional Limits for tasks assessed                                 General Comments: L sided inattention      Exercises      General Comments General comments (skin integrity, edema, etc.): pt dependent for hygiene s/p BM      Pertinent Vitals/Pain Pain Assessment: No/denies pain    Home Living                      Prior Function            PT Goals (current goals can now be found in the care plan section) Progress towards PT goals: Progressing toward goals    Frequency    Min 4X/week      PT Plan Current plan remains appropriate  Co-evaluation              AM-PAC PT "6 Clicks" Daily Activity  Outcome Measure  Difficulty turning over in bed (including adjusting bedclothes, sheets and blankets)?: Unable Difficulty moving from lying on back to sitting on the side of the bed? : Unable Difficulty sitting down on and standing up from a chair with arms (e.g., wheelchair, bedside commode, etc,.)?: Unable Help needed moving to and from a bed to chair (including a wheelchair)?: A Lot Help needed walking in hospital room?: A Lot Help needed climbing 3-5 steps with a railing? : Total 6 Click Score:  8    End of Session Equipment Utilized During Treatment: Gait belt Activity Tolerance: Patient tolerated treatment well Patient left: in chair;with call bell/phone within reach;with family/visitor present Nurse Communication: Mobility status PT Visit Diagnosis: Unsteadiness on feet (R26.81);Muscle weakness (generalized) (M62.81);Dizziness and giddiness (R42)     Time: 9798-9211 PT Time Calculation (min) (ACUTE ONLY): 18 min  Charges:  $Gait Training: 8-22 mins                    G Codes:       Kittie Plater, PT, DPT Pager #: [phone] Office #: [phone]    [place] 06/30/2017, 2:13 PM

## 2017-06-30 NOTE — H&P (Signed)
Physical Medicine and Rehabilitation Admission H&P       Chief Complaint  Patient presents with  . Functional deficits      HPI:  Sara Robertson a 83 y.o.femalewith history of HTN, OSA, CAD, SOB, recent UTI who was admitted on 06/28/2017 after found with right sided weakness and facial droop.History taken from chart review and patient.Weakness resolved enroute to hospital but patient noted to be dysarthric. CT negative for acute process. CTA head/neck showed no large vessel occlusion, no basilar stenosis or dissection and evidence of pathologic mediastinal adenopathy. MRI brain reviewed, showing multifocal left cerebellar CVA. Per report, multiple acute small left cerebellar/SCA territory infarcts. 2 D echo showed EF 50-55% with no wall abnormality, moderate aortic stenosis, severely increased PAH and grade 2 diastolic dysfunction.   Telemetry showed intermittent A fib but family not interested in [facility] due to history of falls and patient's ASA increased to 325 mg daily. Macrodantin added for UTI. Patient with ataxic dysarthria, balance deficits and dizziness affecting mobility. She lives in an [place] and was independent with walker PTA. She was able to shower with assistance. She walks to the dinning room with a couple rest breaks due to SOB.Family is planning to hire supervision after discharge. CIR recommended due to functional deficits   Review of Systems  Constitutional: Negative for diaphoresis.  HENT: Positive for hearing loss. Negative for tinnitus.   Eyes: Negative for blurred vision and double vision.  Respiratory: Negative for cough and shortness of breath.   Cardiovascular: Negative for chest pain and palpitations.  Gastrointestinal: Negative for constipation, heartburn and nausea.  Genitourinary: Negative for dysuria and urgency.  Musculoskeletal: Negative for joint pain and myalgias.  Neurological: Positive for dizziness  (with activity since stroke), sensory change and speech change. Negative for weakness and headaches.  Psychiatric/Behavioral: Positive for memory loss. The patient is not nervous/anxious and does not have insomnia.           Past Medical History:  Diagnosis Date  . Anemia   . Angina pectoris ([place])   . CAD (coronary artery disease)   . Diverticulosis   . GERD (gastroesophageal reflux disease)   . H/O carcinoid syndrome   . Headache   . History of tinnitus   . Hyperlipemia   . Hypertension   . OSA (obstructive sleep apnea)   . Peripheral neuropathy   . Pulmonary HTN ([place])    with noctural hypoxia?  . SOB (shortness of breath)          Past Surgical History:  Procedure Laterality Date  . APPENDECTOMY    . BLADDER SUSPENSION    . CARDIAC CATHETERIZATION  2010  . CARPAL TUNNEL RELEASE Bilateral   . CHOLECYSTECTOMY    . LUMBAR LAMINECTOMY    . TONSILLECTOMY    . VAGINAL HYSTERECTOMY           Family History  Problem Relation Age of Onset  . Cancer Mother   . Cancer Sister   . Cancer Brother     Social History:  Widowed. Retired PT--was in [employer]. Independent with AD. Marland Kitchen She reports that has never smoked. she has never used smokeless tobacco. She reports that she does not drink alcohol. Her drug history is not on file.       Allergies  Allergen Reactions  . Celebrex [Celecoxib] Nausea And Vomiting  . Motrin [Ibuprofen] Nausea And Vomiting  . Nexium [Esomeprazole Magnesium] Nausea And Vomiting  Medications Prior to Admission  Medication Sig Dispense Refill  . ferrous sulfate 325 (65 FE) MG tablet Take 325 mg by mouth daily with breakfast.     . furosemide (LASIX) 40 MG tablet Take 20 mg by mouth daily.     Marland Kitchen losartan (COZAAR) 50 MG tablet Take 50 mg by mouth daily.    . Multiple Vitamins-Minerals (MULTIVITAMIN WITH MINERALS) tablet Take 1 tablet by mouth daily.    . Multiple Vitamins-Minerals  (PRESERVISION AREDS 2 PO) Take 1 capsule by mouth daily.    . nitrofurantoin (MACRODANTIN) 100 MG capsule Take 100 mg by mouth 2 (two) times daily.    Marland Kitchen tiZANidine (ZANAFLEX) 2 MG tablet Take 2 mg by mouth at bedtime.      Drug Regimen Review  Drug regimen was reviewed and remains appropriate with no significant issues identified  Home: Home Living Family/patient expects to be discharged to:: Other (Comment)(Independent living ([facility]) since March) Type of Home: (Independent living) Home Equipment: Environmental consultant - 4 wheels Additional Comments: 2nd floor apartment but has elevator, she goes to [employer] 2x/day, only has fridge, no microwave or stove   Functional History: Prior Function Level of Independence: Independent with assistive device(s) Comments: uses 4ww, walking shower with seat, hand held shower and grabbars, pt dresses/baths self, amb to [employer] on own, has toliet riser and grab bars around [employer]; does her own meds  Functional Status:  Mobility: Bed Mobility Overal bed mobility: Needs Assistance Bed Mobility: Supine to Sit, Rolling Rolling: Min assist Supine to sit: Mod assist General bed mobility comments: Pt up in recliner upon my entry Transfers Overall transfer level: Needs assistance Equipment used: Rolling walker (2 wheeled) Transfers: Sit to/from Stand Sit to Stand: Min assist General transfer comment: Worked with pt on sit to stand from recliner, with safe hand placement Ambulation/Gait Ambulation/Gait assistance: Mod assist, +2 safety/equipment Ambulation Distance (Feet): 25 Feet(x2) Assistive device: Rolling walker (2 wheeled) Gait Pattern/deviations: Step-to pattern, Decreased stride length, Wide base of support, Staggering left, Staggering right General Gait Details: pt very unsteady, c/o dizziness. BP 140/52. Pt very shaky with difficulty with walker management. modA to stabilize pt and to assist with walker management. pt with noted  impaired sequencing of gait pattern  Gait velocity: slow Gait velocity interpretation: Below normal speed for age/gender  ADL: ADL Overall ADL's : Needs assistance/impaired Eating/Feeding: Independent, Sitting Grooming: Minimal assistance, Standing Upper Body Bathing: Set up, Sitting Lower Body Bathing: Minimal assistance, Sit to/from stand Upper Body Dressing : Minimal assistance, Sitting Lower Body Dressing: Minimal assistance, Sit to/from stand Toilet Transfer: Minimal assistance, Ambulation, RW Toileting- Clothing Manipulation and Hygiene: Moderate assistance Toileting - Clothing Manipulation Details (indicate cue type and reason): min A sit<>stand  Cognition: Cognition Overall Cognitive Status: Within Functional Limits for tasks assessed Orientation Level: Oriented X4 Cognition Arousal/Alertness: Awake/alert Behavior During Therapy: WFL for tasks assessed/performed Overall Cognitive Status: Within Functional Limits for tasks assessed   Blood pressure (!) 166/71, pulse 69, temperature 97.6 F (36.4 C), temperature source Oral, resp. rate 20, height _0  (1.626 m), weight 86.7 kg (191 lb 2.2 oz), SpO2 96 %. Physical Exam  Nursing note and vitals reviewed. Constitutional: She is oriented to person, place, and time. She appears well-developed and well-nourished. No distress.  HENT:  Head: Normocephalic and atraumatic.  Mouth/Throat: Oropharynx is clear and moist.  Eyes: Conjunctivae and EOM are normal. Pupils are equal, round, and reactive to light. Right eye exhibits no discharge. Left eye exhibits no discharge.  Neck: Normal range of motion. Neck supple.  Cardiovascular: Normal rate. An irregular rhythm present.  Respiratory: Effort normal and breath sounds normal. No stridor. No respiratory distress.  GI: Soft. Bowel sounds are normal. She exhibits no distension. There is no tenderness.  Musculoskeletal: She exhibits no edema or tenderness.  Neurological: She is  alert and oriented to person, place, and time.  HOH. Moderate to severe dysarthria depending on rate of speech.  Mild left limb ataxia.  Reasonable sitting balance.  Strength grossly 4 out of 5 left upper and left lower extremity in 4 to 4+ out of 5 right upper and right lower extremity.  No sensory findings were noted.  Reflexes are 1+  Skin: Skin is warm and dry. No erythema.  Psychiatric: She has a normal mood and affect. Her behavior is normal. Thought content normal.    LabResultsLast48Hours        Results for orders placed or performed during the hospital encounter of 06/28/17 (from the past 48 hour(s))  Urine rapid drug screen (hosp performed)     Status: None   Collection Time: 06/28/17 11:48 AM  Result Value Ref Range   Opiates NONE DETECTED NONE DETECTED   Cocaine NONE DETECTED NONE DETECTED   Benzodiazepines NONE DETECTED NONE DETECTED   Amphetamines NONE DETECTED NONE DETECTED   Tetrahydrocannabinol NONE DETECTED NONE DETECTED   Barbiturates NONE DETECTED NONE DETECTED    Comment:        DRUG SCREEN FOR MEDICAL PURPOSES ONLY.  IF CONFIRMATION IS NEEDED FOR ANY PURPOSE, NOTIFY LAB WITHIN 5 DAYS.        LOWEST DETECTABLE LIMITS FOR URINE DRUG SCREEN Drug Class       Cutoff (ng/mL) Amphetamine      1000 Barbiturate      200 Benzodiazepine   798 Tricyclics       921 Opiates          300 Cocaine          300 THC              50   Urinalysis, Routine w reflex microscopic     Status: Abnormal   Collection Time: 06/28/17 11:48 AM  Result Value Ref Range   Color, Urine YELLOW YELLOW   APPearance HAZY (A) CLEAR   Specific Gravity, Urine 1.027 1.005 - 1.030   pH 6.0 5.0 - 8.0   Glucose, UA NEGATIVE NEGATIVE mg/dL   Hgb urine dipstick NEGATIVE NEGATIVE   Bilirubin Urine NEGATIVE NEGATIVE   Ketones, ur NEGATIVE NEGATIVE mg/dL   Protein, ur NEGATIVE NEGATIVE mg/dL   Nitrite NEGATIVE NEGATIVE   Leukocytes, UA NEGATIVE NEGATIVE  CBC     Status:  Abnormal   Collection Time: 06/28/17  5:44 PM  Result Value Ref Range   WBC 8.4 4.0 - 10.5 K/uL   RBC 3.70 (L) 3.87 - 5.11 MIL/uL   Hemoglobin 11.4 (L) 12.0 - 15.0 g/dL   HCT 34.8 (L) 36.0 - 46.0 %   MCV 94.1 78.0 - 100.0 fL   MCH 30.8 26.0 - 34.0 pg   MCHC 32.8 30.0 - 36.0 g/dL   RDW 13.2 11.5 - 15.5 %   Platelets 192 150 - 400 K/uL  Creatinine, serum     Status: None   Collection Time: 06/28/17  5:44 PM  Result Value Ref Range   Creatinine, Ser 0.76 0.44 - 1.00 mg/dL   GFR calc non Af Amer >60 >60 mL/min   GFR calc Af Amer >60 >  60 mL/min    Comment: (NOTE) The eGFR has been calculated using the CKD EPI equation. This calculation has not been validated in all clinical situations. eGFR's persistently <60 mL/min signify possible Chronic Kidney Disease.   Magnesium     Status: None   Collection Time: 06/28/17  5:44 PM  Result Value Ref Range   Magnesium 1.9 1.7 - 2.4 mg/dL  Phosphorus     Status: None   Collection Time: 06/28/17  5:44 PM  Result Value Ref Range   Phosphorus 3.2 2.5 - 4.6 mg/dL  Lipid panel     Status: Abnormal   Collection Time: 06/28/17  5:44 PM  Result Value Ref Range   Cholesterol 202 (H) 0 - 200 mg/dL   Triglycerides 68 <150 mg/dL   HDL 45 >40 mg/dL   Total CHOL/HDL Ratio 4.5 RATIO   VLDL 14 0 - 40 mg/dL   LDL Cholesterol 143 (H) 0 - 99 mg/dL    Comment:        Total Cholesterol/HDL:CHD Risk Coronary Heart Disease Risk Table                     Men   Women  1/2 Average Risk   3.4   3.3  Average Risk       5.0   4.4  2 X Average Risk   9.6   7.1  3 X Average Risk  23.4   11.0        Use the calculated Patient Ratio above and the CHD Risk Table to determine the patient's CHD Risk.        ATP III CLASSIFICATION (LDL):  <100     mg/dL   Optimal  100-129  mg/dL   Near or Above                    Optimal  130-159  mg/dL   Borderline  160-189  mg/dL   High  >190     mg/dL   Very High   Hemoglobin A1c     Status:  None   Collection Time: 06/29/17  4:35 AM  Result Value Ref Range   Hgb A1c MFr Bld 5.4 4.8 - 5.6 %    Comment: (NOTE)         Prediabetes: 5.7 - 6.4         Diabetes: >6.4         Glycemic control for adults with diabetes: <7.0    Mean Plasma Glucose 108 mg/dL    Comment: (NOTE) Performed At: [facility] [place], [place] [id number] Rush Farmer MD [id number]   Lipid panel     Status: Abnormal   Collection Time: 06/29/17  4:35 AM  Result Value Ref Range   Cholesterol 179 0 - 200 mg/dL   Triglycerides 71 <150 mg/dL   HDL 40 (L) >40 mg/dL   Total CHOL/HDL Ratio 4.5 RATIO   VLDL 14 0 - 40 mg/dL   LDL Cholesterol 125 (H) 0 - 99 mg/dL    Comment:        Total Cholesterol/HDL:CHD Risk Coronary Heart Disease Risk Table                     Men   Women  1/2 Average Risk   3.4   3.3  Average Risk       5.0   4.4  2 X Average Risk   9.6   7.1  3 X Average Risk  23.4   11.0        Use the calculated Patient Ratio above and the CHD Risk Table to determine the patient's CHD Risk.        ATP III CLASSIFICATION (LDL):  <100     mg/dL   Optimal  100-129  mg/dL   Near or Above                    Optimal  130-159  mg/dL   Borderline  160-189  mg/dL   High  >190     mg/dL   Very High   Basic metabolic panel     Status: Abnormal   Collection Time: 06/29/17  4:35 AM  Result Value Ref Range   Sodium 135 135 - 145 mmol/L   Potassium 3.8 3.5 - 5.1 mmol/L   Chloride 103 101 - 111 mmol/L   CO2 27 22 - 32 mmol/L   Glucose, Bld 112 (H) 65 - 99 mg/dL   BUN 12 6 - 20 mg/dL   Creatinine, Ser 0.74 0.44 - 1.00 mg/dL   Calcium 8.6 (L) 8.9 - 10.3 mg/dL   GFR calc non Af Amer >60 >60 mL/min   GFR calc Af Amer >60 >60 mL/min    Comment: (NOTE) The eGFR has been calculated using the CKD EPI equation. This calculation has not been validated in all clinical situations. eGFR's persistently <60 mL/min signify possible Chronic  Kidney Disease.    Anion gap 5 5 - 15  CBC     Status: Abnormal   Collection Time: 06/29/17  4:35 AM  Result Value Ref Range   WBC 6.8 4.0 - 10.5 K/uL   RBC 3.31 (L) 3.87 - 5.11 MIL/uL   Hemoglobin 10.3 (L) 12.0 - 15.0 g/dL   HCT 31.0 (L) 36.0 - 46.0 %   MCV 93.7 78.0 - 100.0 fL   MCH 31.1 26.0 - 34.0 pg   MCHC 33.2 30.0 - 36.0 g/dL   RDW 13.3 11.5 - 15.5 %   Platelets 202 150 - 400 K/uL      ImagingResults(Last48hours)  Dg Chest 2 View  Result Date: 06/30/2017 CLINICAL DATA:  CVA.  Shortness of breath. EXAM: CHEST  2 VIEW COMPARISON:  06/27/2015. FINDINGS: Cardiomegaly with diffuse bilateral pulmonary interstitial prominence and bilateral pleural effusions findings suggest CHF. Basilar pneumonia cannot be excluded . Elevation the right hemidiaphragm again noted. No pneumothorax. IMPRESSION: 1. Cardiomegaly with bilateral from interstitial prominence and bilateral pleural effusions. Findings suggest CHF. Basilar pneumonia cannot cannot be excluded. 2. Elevation the right hemidiaphragm again noted . Electronically Signed   By: Marcello Moores  Register   On: 06/30/2017 08:32   Mr Brain Wo Contrast  Result Date: 06/28/2017 CLINICAL DATA:  Follow-up suspected stroke. History of hypertension, hyperlipidemia and headache. EXAM: MRI HEAD WITHOUT CONTRAST TECHNIQUE: Multiplanar, multiecho pulse sequences of the brain and surrounding structures were obtained without intravenous contrast. COMPARISON:  CT HEAD June 28, 2017 at 1003 hours. MRI of the head report dated March 28, 2003 though images are not available for direct comparison. FINDINGS: Mildly motion degraded examination. BRAIN: Multiple subcentimeter foci of reduced diffusion LEFT cerebellum and superior cerebellar artery territory. Corresponding low ADC values. No susceptibility artifact to suggest hemorrhage. Ventricles and sulci are normal for patient's age. Patchy supratentorial white matter FLAIR T2 hyperintensities  compatible with moderate chronic small vessel ischemic disease, less than expected for age. Mildly prominent basal ganglia perivascular spaces associated with chronic small vessel  ischemic disease. No midline shift, mass effect or masses. No abnormal extra-axial fluid collections. VASCULAR: Normal major intracranial vascular flow voids present at skull base. SKULL AND UPPER CERVICAL SPINE: No abnormal sellar expansion. No suspicious calvarial bone marrow signal. Craniocervical junction maintained. SINUSES/ORBITS: The mastoid air-cells and included paranasal sinuses are well-aerated. The included ocular globes and orbital contents are non-suspicious. Status post bilateral ocular lens implants. OTHER: None. IMPRESSION: 1. Mild motion degraded examination. 2. Multiple acute small LEFT cerebellar/SCA territory nonhemorrhagic infarcts. 3. Otherwise negative noncontrast MRI of the head for age. Electronically Signed   By: Elon Alas M.D.   On: 06/28/2017 19:54        Medical Problem List and Plan: 1.  Functional and balance deficits secondary to left cerebellar infarct             -Admit to inpatient rehab 2.  DVT Prophylaxis/Anticoagulation: Pharmaceutical: Lovenox 3. Pain Management: N/A 4. Mood: LCSW to follow for evaluation and support.  5. Neuropsych: This patient is capable of making decisions on her own behalf. 6. Skin/Wound Care:  Routine pressure relief measures 7. Fluids/Electrolytes/Nutrition: Monitor I/O. Offer supplements if intake poor.  8. HTN: Monitor BP bid --continue Lasix and Cozaar daily.  9. Frequent UTIs: On macrodantin bid--question to be chronic per family.   -Timed toileting to reduce incontinence 10. Anemia: on iron supplement. 11. Dyslipidemia: On Lipitor. 12. RLS: Managed with tizanidine--currently on hold monitor and resume if needed.      Post Admission Physician Evaluation: 1. Functional deficits secondary  to left cerebellar infarct. 2. Patient is  admitted to receive collaborative, interdisciplinary care between the physiatrist, rehab nursing staff, and therapy team. 3. Patient's level of medical complexity and substantial therapy needs in context of that medical necessity cannot be provided at a lesser intensity of care such as a SNF. 4. Patient has experienced substantial functional loss from his/her baseline which was documented above under the "Functional History" and "Functional Status" headings.  Judging by the patient's diagnosis, physical exam, and functional history, the patient has potential for functional progress which will result in measurable gains while on inpatient rehab.  These gains will be of substantial and practical use upon discharge  in facilitating mobility and self-care at the household level. 5. Physiatrist will provide 24 hour management of medical needs as well as oversight of the therapy plan/treatment and provide guidance as appropriate regarding the interaction of the two. 6. The Preadmission Screening has been reviewed and patient status is unchanged unless otherwise stated above. 7. 24 hour rehab nursing will assist with bladder management, bowel management, safety, skin/wound care, disease management, medication administration and patient education  and help integrate therapy concepts, techniques,education, etc. 8. PT will assess and treat for/with: Lower extremity strength, range of motion, stamina, balance, functional mobility, safety, adaptive techniques and equipment, Neuromuscular reeducation, vestibular assessment and training, family education.   Goals are: Supervision to minimal assistance. 9. OT will assess and treat for/with: ADL's, functional mobility, safety, upper extremity strength, adaptive techniques and equipment neuromuscular reeducation, vestibular, assessment and training, family education.   Goals are: Supervision to minimal assistance. Therapy may proceed with showering this patient. 10. SLP will  assess and treat for/with: N/A.  Goals are: N/A    11. Case Management and Social Worker will assess and treat for psychological issues and discharge planning. 12. Team conference will be held weekly to assess progress toward goals and to determine barriers to discharge. 13. Patient will receive at least 3 hours of  therapy per day at least 5 days per week. 14. ELOS:  16-19 days       15. Prognosis:  excellent     Meredith Staggers, MD, [facility] 06/30/2017  Bary Leriche, Hershal Coria 06/30/2017

## 2017-06-30 NOTE — Progress Notes (Signed)
Inpatient Rehabilitation  Met with patient and daughter at bedside today to discuss team's recommendation for IP Rehab.  Shared booklets and answered questions.  Patient is motivated to regain her independence.  I have received medical clearance and have a bed to offer patient today.  Plan to proceed with admission.  Call if questions.   Carmelia Roller., CCC/SLP Admission Coordinator  [place] [facility]  Cell [phone]

## 2017-06-30 NOTE — Progress Notes (Signed)
Report given to nurse 4W.  Patient taken to unit by bed.  All belongings with patient.  Family member with patient.

## 2017-06-30 NOTE — Progress Notes (Signed)
Received pt. As a transfer pt. And family had been oriented to the unit routine and protocol

## 2017-06-30 NOTE — Care Management Note (Signed)
Case Management Note  Patient Details  Name: Sara Robertson MRN: [id number] Date of Birth: 1918/05/06  Subjective/Objective:                    Action/Plan: Pt discharging to CIR today. No further needs per CM.  Expected Discharge Date:                  Expected Discharge Plan:  Assisted Living / Rest Home  In-House Referral:     Discharge planning Services  CM Consult  Post Acute Care Choice:    Choice offered to:     DME Arranged:    DME Agency:     HH Arranged:    [place] Agency:     Status of Service:  Completed, signed off  If discussed at [employer] of Stay Meetings, dates discussed:    Additional Comments:  Pollie Friar, RN 06/30/2017, 11:20 AM

## 2017-07-01 ENCOUNTER — Inpatient Hospital Stay (HOSPITAL_COMMUNITY): Payer: Medicare Other | Admitting: Physical Therapy

## 2017-07-01 ENCOUNTER — Inpatient Hospital Stay (HOSPITAL_COMMUNITY): Payer: Medicare Other | Admitting: Occupational Therapy

## 2017-07-01 LAB — COMPREHENSIVE METABOLIC PANEL
ALBUMIN: 2.7 g/dL — AB (ref 3.5–5.0)
ALT: 17 U/L (ref 14–54)
AST: 21 U/L (ref 15–41)
Alkaline Phosphatase: 55 U/L (ref 38–126)
Anion gap: 8 (ref 5–15)
BILIRUBIN TOTAL: 0.6 mg/dL (ref 0.3–1.2)
BUN: 10 mg/dL (ref 6–20)
CO2: 25 mmol/L (ref 22–32)
CREATININE: 0.78 mg/dL (ref 0.44–1.00)
Calcium: 8.8 mg/dL — ABNORMAL LOW (ref 8.9–10.3)
Chloride: 105 mmol/L (ref 101–111)
GFR calc Af Amer: >60 mL/min (ref >60)
GLUCOSE: 113 mg/dL — AB (ref 65–99)
POTASSIUM: 3.8 mmol/L (ref 3.5–5.1)
Sodium: 138 mmol/L (ref 135–145)
TOTAL PROTEIN: 5.8 g/dL — AB (ref 6.5–8.1)

## 2017-07-01 LAB — CBC WITH DIFFERENTIAL/PLATELET
BASOS ABS: 0.1 10*3/uL (ref 0.0–0.1)
BASOS PCT: 2 %
Eosinophils Absolute: 0.3 10*3/uL (ref 0.0–0.7)
Eosinophils Relative: 5 %
HEMATOCRIT: 34.4 % — AB (ref 36.0–46.0)
HEMOGLOBIN: 11.5 g/dL — AB (ref 12.0–15.0)
LYMPHS PCT: 20 %
Lymphs Abs: 1.2 10*3/uL (ref 0.7–4.0)
MCH: 31.4 pg (ref 26.0–34.0)
MCHC: 33.4 g/dL (ref 30.0–36.0)
MCV: 94 fL (ref 78.0–100.0)
MONO ABS: 0.8 10*3/uL (ref 0.1–1.0)
Monocytes Relative: 13 %
NEUTROS ABS: 3.9 10*3/uL (ref 1.7–7.7)
NEUTROS PCT: 62 %
Platelets: 226 10*3/uL (ref 150–400)
RBC: 3.66 MIL/uL — AB (ref 3.87–5.11)
RDW: 12.9 % (ref 11.5–15.5)
WBC: 6.3 10*3/uL (ref 4.0–10.5)

## 2017-07-01 NOTE — Progress Notes (Signed)
[facility] PHYSICAL MEDICINE & REHABILITATION     PROGRESS NOTE    Subjective/Complaints:   Objective: Vital Signs: Blood pressure (!) 160/72, pulse 66, temperature 98 F (36.7 C), temperature source Oral, resp. rate 18, height 5\' 4"  (1.626 m), weight 87 kg (191 lb 12.8 oz), SpO2 97 %. Dg Chest 2 View  Result Date: 06/30/2017 CLINICAL DATA:  CVA.  Shortness of breath. EXAM: CHEST  2 VIEW COMPARISON:  06/27/2015. FINDINGS: Cardiomegaly with diffuse bilateral pulmonary interstitial prominence and bilateral pleural effusions findings suggest CHF. Basilar pneumonia cannot be excluded . Elevation the right hemidiaphragm again noted. No pneumothorax. IMPRESSION: 1. Cardiomegaly with bilateral from interstitial prominence and bilateral pleural effusions. Findings suggest CHF. Basilar pneumonia cannot cannot be excluded. 2. Elevation the right hemidiaphragm again noted . Electronically Signed   By: Marcello Moores  Register   On: 06/30/2017 08:32   Recent Labs    06/28/17 1744 06/29/17 0435  WBC 8.4 6.8  HGB 11.4* 10.3*  HCT 34.8* 31.0*  PLT 192 202   Recent Labs    06/28/17 0955 06/28/17 1001 06/28/17 1744 06/29/17 0435  NA 135 138  --  135  K 3.8 3.9  --  3.8  CL 104 102  --  103  GLUCOSE 137* 140*  --  112*  BUN 15 15  --  12  CREATININE 0.83 0.80 0.76 0.74  CALCIUM 8.9  --   --  8.6*   CBG (last 3)  No results for input(s): GLUCAP in the last 72 hours.  Wt Readings from Last 3 Encounters:  07/01/17 87 kg (191 lb 12.8 oz)  06/28/17 86.7 kg (191 lb 2.2 oz)  11/30/16 83.9 kg (185 lb)    Physical Exam:  Constitutional: She is oriented to person, place, and time. She appears well-developed and well-nourished. No distress.  HENT:  Head: Normocephalic and atraumatic.  Mouth/Throat: Oropharynx is clear and moist.  Eyes: Conjunctivae and EOM are normal. Pupils are equal, round, and reactive to light. Right eye exhibits no discharge. Left eye exhibits no discharge.  Neck: Normal range  of motion. Neck supple.  Cardiovascular: Irregularly irregular without JVD or murmur  Respiratory: normal effort. Has oxygen via [place] 2l GI: Soft. Bowel sounds are normal. She exhibits no distension. There is no tenderness.  Musculoskeletal: She exhibits no edema or tenderness.  Neurological: She is alert and oriented to person, place, and time.  Fair insight and awareness HOH.  Ongoing dysarthria but intelligiblemld left limb ataxia.  Strength remains grossly 4 out of 5 left upper and left lower extremity in 4 to 4+ out of 5 right upper and right lower extremity. Normal sensationrflexes are 1+ Skin: Skin is warm and dry. No erythema.  Psychiatric: She has a normal mood and affect. Her behavior is normal. Thought content normal.     Assessment/Plan: 1.  Functional and balance/gait deficits secondary to cerebellar infarct which require 3+ hours per day of interdisciplinary therapy in a comprehensive inpatient rehab setting. Physiatrist is providing close team supervision and 24 hour management of active medical problems listed below. Physiatrist and rehab team continue to assess barriers to discharge/monitor patient progress toward functional and medical goals.  Function:  Bathing Bathing position      Bathing parts      Bathing assist        Upper Body Dressing/Undressing Upper body dressing                    Upper body assist  Lower Body Dressing/Undressing Lower body dressing                                  Lower body assist        Toileting Toileting          Toileting assist     Transfers Chair/bed transfer Chair/bed transfer activity did not occur: Safety/medical concerns           Manufacturing systems engineer          Cognition Comprehension Comprehension assist level: Set up assist with hearing device, Follows basic conversation/direction with no assist  Expression Expression assist level: Expresses  basic needs/ideas: With extra time/assistive device  Social Interaction Social Interaction assist level: Interacts appropriately with others - No medications needed.  Problem Solving Problem solving assist level: Solves basic problems with no assist  Memory Memory assist level: Recognizes or recalls 50 - 74% of the time/requires cueing 25 - 49% of the time   Medical Problem List and Plan: 1. Functional and balance deficits secondary to left cerebellar infarct -Beginning therapies today 2. DVT Prophylaxis/Anticoagulation: Pharmaceutical: Lovenox 3. Pain Management: N/A 4. Mood: LCSW to follow for evaluation and support.  5. Neuropsych: This patient is capable of making decisions on her own behalf. 6. Skin/Wound Care: Routine pressure relief measures 7. Fluids/Electrolytes/Nutrition: Encourage p.o. intake     -Awaiting lab work today.  8. HTN: Monitor BP bid --continue Lasix and Cozaar daily.  9. Frequent UTIs: On macrodantin bid--patient with chronic urge incontinence but this is increased beyond her baseline.            -Timed toileting to reduce incontinence   -Fluid rationing as well to avoid increased output at night 10. Anemia: on iron supplement.  Awaiting lab work today 22. Dyslipidemia: On Lipitor. 12. RLS: Managed with tizanidine--continue to hold.  Monitor and resume if needed.     LOS (Days) 1 A [place] T, MD 07/01/2017 9:15 AM

## 2017-07-01 NOTE — Progress Notes (Signed)
Physical Medicine and Rehabilitation Consult   Reason for Consult: Functional deficits Referring Physician: Dr. Eliseo Squires   HPI: Sara Robertson is a 83 y.o. female with history of HTN, OSA, CAD, SOB, recent UTI who was admitted on 06/28/2017 after found with right sided weakness and facial droop. History taken from chart review and patient. Weakness resolved enroute to hospital but patient noted to be dysarthric.  CT negative for acute process.  CTA head/neck showed no large vessel occlusion, no basilar stenosis or dissection and evidence of pathologic mediastinal adenopathy.  MRI brain reviewed, showing multifocal left cerebellar CVA.  Per report, multiple acute small left cerebellar/SCA territory infarcts.  2 D echo showed EF 50-55% with no wall abnormality, moderate aortic stenosis, severely increased PAH and grade 2 diastolic dysfunction.   Telemetry showed intermittent A fib but family not interested in [facility] due to history of falls and patient's ASA increased to 325 mg daily.  Macrodantin added for UTI.  Patient with ataxic dysarthria, balance deficits and dizziness affecting mobility.  She lives in an  [place] facility and was independent with walker PTA. She was able to shower with assistance. She walks to the dinning room with a couple rest breaks due to SOB. Family is planning to hire supervision after discharge. CIR recommended due to functional deficits.    Review of Systems  Constitutional: Negative for chills and fever.  HENT: Positive for hearing loss.   Eyes: Negative for blurred vision and double vision.  Respiratory: Positive for shortness of breath (with activity).   Cardiovascular: Negative for chest pain and palpitations.  Gastrointestinal: Negative for abdominal pain, heartburn and nausea.  Genitourinary: Negative for dysuria and urgency.  Musculoskeletal: Negative for back pain, joint pain and myalgias.  Neurological: Positive for sensory change (used to have  neuropathy BLE) and speech change.  Psychiatric/Behavioral: The patient is not nervous/anxious.   All other systems reviewed and are negative.        Past Medical History:  Diagnosis Date  . Anemia   . Angina pectoris ([place])   . CAD (coronary artery disease)   . GERD (gastroesophageal reflux disease)   . Headache   . Hyperlipemia   . Hypertension   . OSA (obstructive sleep apnea)   . SOB (shortness of breath)          Past Surgical History:  Procedure Laterality Date  . APPENDECTOMY    . BLADDER SUSPENSION    . CARDIAC CATHETERIZATION  2010  . CARPAL TUNNEL RELEASE Bilateral   . CHOLECYSTECTOMY    . LUMBAR LAMINECTOMY    . TONSILLECTOMY    . VAGINAL HYSTERECTOMY            Family History  Problem Relation Age of Onset  . Cancer Mother   . Cancer Sister   . Cancer Brother     Social History:  Widowed. Retired PT--was in [employer]. Independent with AD. Marland Kitchen  She reports that  has never smoked. she has never used smokeless tobacco. She reports that she does not drink alcohol. Her drug history is not on file.   Allergies  Allergen Reactions  . Celebrex [Celecoxib] Nausea And Vomiting  . Motrin [Ibuprofen] Nausea And Vomiting  . Nexium [Esomeprazole Magnesium] Nausea And Vomiting          Medications Prior to Admission  Medication Sig Dispense Refill  . ferrous sulfate 325 (65 FE) MG tablet Take 325 mg by mouth daily with breakfast.     . furosemide (  LASIX) 40 MG tablet Take 20 mg by mouth daily.     Marland Kitchen losartan (COZAAR) 50 MG tablet Take 50 mg by mouth daily.    . Multiple Vitamins-Minerals (MULTIVITAMIN WITH MINERALS) tablet Take 1 tablet by mouth daily.    . Multiple Vitamins-Minerals (PRESERVISION AREDS 2 PO) Take 1 capsule by mouth daily.    . nitrofurantoin (MACRODANTIN) 100 MG capsule Take 100 mg by mouth 2 (two) times daily.    Marland Kitchen tiZANidine (ZANAFLEX) 2 MG tablet Take 2 mg by mouth at bedtime.       Home: Home Living Family/patient expects to be discharged to:: Other (Comment)(Independent living (Home place) since March) Type of Home: (Independent living) Home Equipment: Environmental consultant - 4 wheels Additional Comments: 2nd floor apartment but has elevator, she goes to [employer] 2x/day, only has fridge, no microwave or stove  Functional History: Prior Function Level of Independence: Independent with assistive device(s) Comments: uses 4ww, walking shower with seat, hand held shower and grabbars, pt dresses/baths self, amb to [employer] on own, has toliet riser and grab bars around [employer]; does her own meds Functional Status:  Mobility: Bed Mobility Overal bed mobility: Needs Assistance Bed Mobility: Supine to Sit, Rolling Rolling: Min assist Supine to sit: Mod assist General bed mobility comments: Pt up in recliner upon my entry Transfers Overall transfer level: Needs assistance Equipment used: Rolling walker (2 wheeled) Transfers: Sit to/from Stand Sit to Stand: Min assist General transfer comment: Worked with pt on sit to stand from recliner, with safe hand placement Ambulation/Gait Ambulation/Gait assistance: Mod assist, +2 safety/equipment Ambulation Distance (Feet): 25 Feet(x2) Assistive device: Rolling walker (2 wheeled) Gait Pattern/deviations: Step-to pattern, Decreased stride length, Wide base of support, Staggering left, Staggering right General Gait Details: pt very unsteady, c/o dizziness. BP 140/52. Pt very shaky with difficulty with walker management. modA to stabilize pt and to assist with walker management. pt with noted impaired sequencing of gait pattern  Gait velocity: slow Gait velocity interpretation: Below normal speed for age/gender  ADL: ADL Overall ADL's : Needs assistance/impaired Eating/Feeding: Independent, Sitting Grooming: Minimal assistance, Standing Upper Body Bathing: Set up, Sitting Lower Body Bathing: Minimal assistance, Sit to/from  stand Upper Body Dressing : Minimal assistance, Sitting Lower Body Dressing: Minimal assistance, Sit to/from stand Toilet Transfer: Minimal assistance, Ambulation, RW Toileting- Clothing Manipulation and Hygiene: Moderate assistance Toileting - Clothing Manipulation Details (indicate cue type and reason): min A sit<>stand  Cognition: Cognition Overall Cognitive Status: Within Functional Limits for tasks assessed Orientation Level: Oriented X4 Cognition Arousal/Alertness: Awake/alert Behavior During Therapy: WFL for tasks assessed/performed Overall Cognitive Status: Within Functional Limits for tasks assessed  Blood pressure (!) 131/54, pulse 70, temperature 98.4 F (36.9 C), temperature source Oral, resp. rate 20, height 5\' 4"  (1.626 m), weight 86.7 kg (191 lb 2.2 oz), SpO2 99 %. Physical Exam  Nursing note and vitals reviewed. Constitutional: She is oriented to person, place, and time. She appears well-developed.  Obese  HENT:  Head: Normocephalic and atraumatic.  Mouth/Throat: Oropharynx is clear and moist.  Eyes: Conjunctivae and EOM are normal. Pupils are equal, round, and reactive to light.  Neck: Normal range of motion. Neck supple.  Cardiovascular: Normal rate and regular rhythm.  No murmur heard. Respiratory: Effort normal and breath sounds normal. No stridor. No respiratory distress. She has no wheezes.  GI: Soft. Bowel sounds are normal. She exhibits no distension. There is no tenderness.  Musculoskeletal: She exhibits no edema or tenderness.  Neurological: She is alert and oriented  to person, place, and time.  Mild dysarthria with mild right facial weakness.  Able to answer orientation questions but had difficulty remembering some details from distant past as well as name of ALF.  Able to follow simple motor commands without difficulty.  Motor: RUE/RLE: 5/5 LUE/LLE: 4+/5 LUE ataxia HOH  Skin: Skin is warm and dry.  Psychiatric: She has a normal mood and affect.  Her behavior is normal. Judgment and thought content normal. Cognition and memory are impaired.   Assessment/Plan: Diagnosis: Multiple left cerebellar CVA Labs and images independently reviewed.  Records reviewed and summated above. Stroke: Continue secondary stroke prophylaxis and Risk Factor Modification listed below:   Antiplatelet therapy:   Blood Pressure Management:  Continue current medication with prn's with permisive HTN per primary team Statin Agent:    1. Does the need for close, 24 hr/day medical supervision in concert with the patient's rehab needs make it unreasonable for this patient to be served in a less intensive setting? Yes  2. Co-Morbidities requiring supervision/potential complications: UTI (cont abx), A fib (no anticoag due to falls, monitor HR with increased mobility), PAH (recs per primary team),  diastolic dysfunction (monitor for signs/symptoms of fluid overload), HTN (monitor and provide prns in accordance with increased physical exertion and pain), OSA (monitor for daytime somnolence and energy), CAD (cont meds), SOB, ABLA (transfuse if necessary to ensure appropriate perfusion for increased activity tolerance) 3. Due to safety, disease management and patient education, does the patient require 24 hr/day rehab nursing? Yes 4. Does the patient require coordinated care of a physician, rehab nurse, PT (1-2 hrs/day, 5 days/week) and OT (1-2 hrs/day, 5 days/week) to address physical and functional deficits in the context of the above medical diagnosis(es)? Yes Addressing deficits in the following areas: balance, endurance, locomotion, strength, transferring, bathing, dressing, toileting and psychosocial support 5. Can the patient actively participate in an intensive therapy program of at least 3 hrs of therapy per day at least 5 days per week? Yes 6. The potential for patient to make measurable gains while on inpatient rehab is excellent 7. Anticipated functional outcomes  upon discharge from inpatient rehab are supervision and min assist  with PT, supervision and min assist with OT, n/a with SLP. 8. Estimated rehab length of stay to reach the above functional goals is: 16-19 days. 9. Anticipated D/C setting: Home 10. Anticipated post D/C treatments: HH therapy and Home excercise program 11. Overall Rehab/Functional Prognosis: good  RECOMMENDATIONS: This patient's condition is appropriate for continued rehabilitative care in the following setting: CIR Patient has agreed to participate in recommended program. Yes Note that insurance prior authorization may be required for reimbursement for recommended care.  Comment: Rehab Admissions Coordinator to follow up.  Delice Lesch, MD, ABPMR [place] [place], [place] 06/29/2017          Revision History                        Routing History

## 2017-07-01 NOTE — Progress Notes (Signed)
Physical Therapy Session Note  Patient Details  Name: LASHE OLIVEIRA MRN: [id number] Date of Birth: [birth year]  Today's Date: 07/01/2017 PT Individual Time: 1300-1400 PT Individual Time Calculation (min): 60 min   Short Term Goals: Week 1:  PT Short Term Goal 1 (Week 1): Pt will perform bed mobility with S from flat bed PT Short Term Goal 2 (Week 1): Pt will perform stand pivot transfers with S PT Short Term Goal 3 (Week 1): Pt will ambulate 150' with min guard PT Short Term Goal 4 (Week 1): Pt will ascend/descent four 6" steps with B handrails and minA for strengthening  Skilled Therapeutic Interventions/Progress Updates:  Pt presented in w/c returning from bathroom with NT. Pt agreeable to therapy. Pt transported to rehab gym for energy conservation. Performed ambulatory transfer to mat and participated in standing static and dynamic balance activities on level surface. Pt noted to have occasional posterior lean requiring minA for recovery. Activities included playing horseshoes, placing horseshoes on target, reaching with contralateral LE on 4in step and alternating toe taps. Pt required intermittent seated rests due to fatigue. Performed gait with RW 32f with min guard. Pt requiring cues throughout session for hand placement with transfers. Performed NuStep L1 x 6 min for endurance and reciprocal activity. Pt transported back to room and performed ambulatory transfer to bed with RW. Sit to supine supervision with bed flat and use of bed rail. Pt able to reposition self in bed and left with bed alarm on and needs met.   Therapy Documentation Precautions:  Precautions Precautions: Fall Restrictions Weight Bearing Restrictions: No General:   Vital Signs: Therapy Vitals Temp: 98.1 F (36.7 C) Temp Source: Oral Pulse Rate: 64 Resp: 17 BP: (!) 157/64 Patient Position (if appropriate): Sitting Oxygen Therapy SpO2: 100 % O2 Device: Not Delivered Pain: Pain Assessment Pain  Assessment: No/denies pain   See Function Navigator for Current Functional Status.   Therapy/Group: Individual Therapy  Nevaeha Finerty  Iza Preston, PTA  07/01/2017, 3:51 PM

## 2017-07-01 NOTE — Progress Notes (Signed)
PMR Admission Coordinator Pre-Admission Assessment  Patient: Sara Robertson is an 83 y.o., female MRN: [id number] DOB: 22-Oct-1917 Height: 5\' 4"  (162.6 cm) Weight: 86.7 kg (191 lb 2.2 oz)                                                                                                                                                  Insurance Information HMO:     PPO:      PCP:      IPA:      80/20:      OTHER:  PRIMARY: Medicare A & B      Policy#: [id number]      Subscriber: Self CM Name:       Phone#:      Fax#:  Pre-Cert#: Eligible       Employer: Retired  Benefits:  Phone #: Verified online      Name: Passport One Eff. Date: 10/26/1984     Deduct: $1340      Out of Pocket Max: N/A      Life Max: N/A CIR: 100%      SNF: 100% days 1-20; 80% days 21-100 Outpatient: 80%     Co-Pay: 20% Home Health: 100%      Co-Pay: none DME: 80%     Co-Pay: 20% Providers: Patient's Choice   SECONDARY: [facility]      Policy#: [id number]      Subscriber: Retired  [place] Name:       Phone#:      Fax#:  Pre-Cert#:       Employer:  Benefits:  Phone #: [phone]     Name:  Eff. Date:      Deduct:       Out of Pocket Max:       Life Max:  CIR:       SNF:  Outpatient:      Co-Pay:  Home Health:       Co-Pay:  DME:      Co-Pay:   Medicaid Application Date:       Case Manager:  Disability Application Date:       Case Worker:   Emergency Contact Information        Contact Information    Name Relation Home Work Waka C Daughter [phone]    Brandt Loosen [phone]       Current Medical History  Patient Admitting Diagnosis: Multiple left cerebellar CVA  History of Present Illness: Sara Robertson a 83 y.o.femalewith history of HTN, OSA, CAD, SOB, recent UTI who was admitted on 06/28/2017 after found with right sided weakness and facial droop.History taken from chart review and patient.Weakness resolved enroute to hospital but patient noted to  be dysarthric. CT negative for acute process. CTA head/neck showed no large vessel occlusion, no basilar stenosis or  dissection and evidence of pathologic mediastinal adenopathy. MRI brain reviewed, showing multifocal left cerebellar CVA. Per report, multiple acute small left cerebellar/SCA territory infarcts. 2 D echo showed EF 50-55% with no wall abnormality, moderate aortic stenosis, severely increased PAH and grade 2 diastolic dysfunction.   Telemetry showed intermittent A fib but family not interested in [facility] due to history of falls and patient's ASA increased to 325 mg daily. Macrodantin added for UTI. Patient with ataxic dysarthria, balance deficits and dizziness affecting mobility. She lives in [place] facility and was independent with walker PTA. She was able to shower with assistance. She walks to the dinning room with a couple rest breaks due to SOB.Family is planning to hire supervision after discharge. CIR recommended due to functional deficits and patient admitted 06/30/17.  NIH Total: 2  Past Medical History      Past Medical History:  Diagnosis Date  . Anemia   . Angina pectoris ([place])   . CAD (coronary artery disease)   . Diverticulosis   . GERD (gastroesophageal reflux disease)   . H/O carcinoid syndrome   . Headache   . History of tinnitus   . Hyperlipemia   . Hypertension   . OSA (obstructive sleep apnea)   . Peripheral neuropathy   . Pulmonary HTN ([place])    with noctural hypoxia?  . SOB (shortness of breath)     Family History  family history includes Cancer in her brother, mother, and sister.  Prior Rehab/Hospitalizations:  Has the patient had major surgery during 100 days prior to admission? No  Current Medications   Current Facility-Administered Medications:  .   stroke: mapping our early stages of recovery book, , Does not apply, Once, Sara Allan I, MD .  aspirin tablet 325 mg, 325 mg, Oral, Daily, Sara Allan I, MD .  atorvastatin (LIPITOR) tablet 20 mg, 20 mg, Oral, q1800, Robertson, Sara A, NP .  enoxaparin (LOVENOX) injection 40 mg, 40 mg, Subcutaneous, Q24H, Robertson, Sara I, MD .  ferrous sulfate tablet 325 mg, 325 mg, Oral, Q breakfast, Sara Allan I, MD, 325 mg at 06/30/17 0841 .  furosemide (LASIX) tablet 20 mg, 20 mg, Oral, Daily, Robertson, Sara U, DO, 20 mg at 06/30/17 1254 .  labetalol (NORMODYNE,TRANDATE) injection 10 mg, 10 mg, Intravenous, Q2H PRN, Sara Allan I, MD .  losartan (COZAAR) tablet 50 mg, 50 mg, Oral, Daily, Sara Allan I, MD, 50 mg at 06/30/17 0841 .  multivitamin with minerals tablet 1 tablet, 1 tablet, Oral, Daily, Sara Allan I, MD, 1 tablet at 06/30/17 0841 .  nitrofurantoin (MACRODANTIN) capsule 100 mg, 100 mg, Oral, Q12H, Rise Patience, MD, 100 mg at 06/30/17 0841 .  senna-docusate (Senokot-S) tablet 1 tablet, 1 tablet, Oral, BID, Sara Allan I, MD, 1 tablet at 06/29/17 2341 .  vitamin B-12 (CYANOCOBALAMIN) tablet 500 mcg, 500 mcg, Oral, Daily, Sara Allan I, MD, 500 mcg at 06/30/17 6644  Facility-Administered Medications Ordered in Other Encounters:  .  0.9 %  sodium chloride infusion, 250 mL, Intravenous, Once, Robertson, Sara Glaze C, MD .  acetaminophen (TYLENOL) tablet 650 mg, 650 mg, Oral, Once, Robertson, Sara Bayron C, MD .  diphenhydrAMINE (BENADRYL) capsule 25 mg, 25 mg, Oral, Once, Robertson, Sara Camerer C, MD .  heparin lock flush 100 unit/mL, 500 Units, Intracatheter, Daily PRN, Robertson, Sara Potteiger C, MD .  heparin lock flush 100 unit/mL, 250 Units, Intracatheter, PRN, Robertson, Sara Batte C, MD .  sodium chloride 0.9 % injection 10 mL, 10 mL, Intracatheter, PRN, Sara Gip,  Iliya Spivack C, MD .  sodium chloride 0.9 % injection 3 mL, 3 mL, Intracatheter, PRN, Sara Asal, MD  Patients Current Diet: Diet Heart Room service appropriate? Yes; Fluid consistency: Thin Diet - low sodium heart healthy  Precautions /  Restrictions Precautions Precautions: Fall Restrictions Weight Bearing Restrictions: No   Has the patient had 2 or more falls or a fall with injury in the past year?No, 1 fall right before this admission   Prior Activity Level Community (5-7x/wk): Prior to admission patient lived at an independent living facility.  She was independent with all self-care tasks, active winthin her community, and went out to eat with her daughter about once a week.    Home Assistive Devices / Equipment Home Assistive Devices/Equipment: Gilford Rile (specify type) Home Equipment: Walker - 4 wheels  Prior Device Use: Indicate devices/aids used by the patient prior to current illness, exacerbation or injury? Walker  Prior Functional Level Prior Function Level of Independence: Independent with assistive device(s) Comments: uses 4ww, walking shower with seat, hand held shower and grabbars, pt dresses/baths self, amb to [employer] on own, has toliet riser and grab bars around [employer]; does her own meds  Self Care: Did the patient need help bathing, dressing, using the toilet or eating? Independent  Indoor Mobility: Did the patient need assistance with walking from room to room (with or without device)? Independent  Stairs: Did the patient need assistance with internal or external stairs (with or without device)? Dependent  Functional Cognition: Did the patient need help planning regular tasks such as shopping or remembering to take medications? Independent  Current Functional Level Cognition  Overall Cognitive Status: Within Functional Limits for tasks assessed Orientation Level: Oriented X4    Extremity Assessment (includes Sensation/Coordination)  Upper Extremity Assessment: LUE deficits/detail LUE Deficits / Details: when comparing LUE to RUE (LUE with decreased GM (finger to nose, rasing and lowering arms) and FM coordination (finger opposition) LUE Coordination: decreased gross motor,  decreased fine motor  Lower Extremity Assessment: LLE deficits/detail LLE Deficits / Details: grossly 4/5 LLE Coordination: decreased gross motor    ADLs  Overall ADL's : Needs assistance/impaired Eating/Feeding: Independent, Sitting Grooming: Minimal assistance, Standing Upper Body Bathing: Set up, Sitting Lower Body Bathing: Minimal assistance, Sit to/from stand Upper Body Dressing : Minimal assistance, Sitting Lower Body Dressing: Minimal assistance, Sit to/from stand Toilet Transfer: Minimal assistance, Ambulation, RW Toileting- Clothing Manipulation and Hygiene: Moderate assistance Toileting - Clothing Manipulation Details (indicate cue type and reason): min A sit<>stand    Mobility  Overal bed mobility: Needs Assistance Bed Mobility: Supine to Sit, Rolling Rolling: Min assist Supine to sit: Mod assist General bed mobility comments: Pt up in recliner upon my entry    Transfers  Overall transfer level: Needs assistance Equipment used: Rolling walker (2 wheeled) Transfers: Sit to/from Stand Sit to Stand: Min assist General transfer comment: Worked with pt on sit to stand from recliner, with safe hand placement    Ambulation / Gait / Stairs / Wheelchair Mobility  Ambulation/Gait Ambulation/Gait assistance: Mod assist, +2 safety/equipment Ambulation Distance (Feet): 25 Feet(x2) Assistive device: Rolling walker (2 wheeled) Gait Pattern/deviations: Step-to pattern, Decreased stride length, Wide base of support, Staggering left, Staggering right General Gait Details: pt very unsteady, c/o dizziness. BP 140/52. Pt very shaky with difficulty with walker management. modA to stabilize pt and to assist with walker management. pt with noted impaired sequencing of gait pattern  Gait velocity: slow Gait velocity interpretation: Below normal speed for age/gender  Posture / Balance Balance Overall balance assessment: Needs assistance Sitting-balance support: Feet supported,  No upper extremity supported Sitting balance-Leahy Scale: Fair Standing balance support: Bilateral upper extremity supported Standing balance-Leahy Scale: Poor Standing balance comment: pt requires physical assist and RW to maintain stability    Special needs/care consideration BiPAP/CPAP: No, but uses 2L nasal cannula at night  CPM: No Continuous Drip IV: No Dialysis: No         Life Vest: No Oxygen: 2L at night, see above  Special Bed: No Trach Size: No Wound Vac (area): No       Skin: Dry, Bruising to left arm                                Bowel mgmt: Continent, last BM 06/30/17 Bladder mgmt: Continent, but urgency  Diabetic mgmt: No,HgbA1c goal < 7.0        Previous Home Environment Type of Home: (Independent living) Care Facility Name: [facility]: No Additional Comments: 2nd floor apartment but has elevator, she goes to [employer] 2x/day, only has fridge, no microwave or stove  Discharge Living Setting Plans for Discharge Living Setting: Patient's home, Apartment Type of Home at Discharge: [place] Name at Discharge: [facility] [place]  Discharge Home Layout: One level Discharge Home Access: Elevator, Level entry Discharge Bathroom Shower/Tub: Walk-in shower Discharge Bathroom Toilet: Handicapped height Discharge Bathroom Accessibility: Yes How Accessible: Accessible via walker Does the patient have any problems obtaining your medications?: No  Social/Family/Support Systems Patient Roles: Parent, Other (Comment)(Grandparent ) Contact Information: Daughter: [place] Anticipated Caregiver: Daughter arranging with family, hired help, and facility  Anticipated Caregiver's Contact Information: [phone] Ability/Limitations of Caregiver: Daughter, Blandina works but is the only local daughter  Caregiver Availability: 24/7 Discharge Plan Discussed with Primary Caregiver: Yes Is Caregiver In  Agreement with Plan?: Yes Does Caregiver/Family have Issues with Lodging/Transportation while Pt is in Rehab?: No  Goals/Additional Needs Patient/Family Goal for Rehab: OT/PT: Supervision-Min A  Expected length of stay: 16-19 days  Cultural Considerations: Catholic and requests an hour in the morning to do her prayers  Dietary Needs: Heart Healthy  Equipment Needs: TBD Special Service Needs: None Additional Information: None Pt/Family Agrees to Admission and willing to participate: Yes Program Orientation Provided & Reviewed with Pt/Caregiver Including Roles  & Responsibilities: Yes Additional Information Needs: Daughter reports that facility has their own version of life alert and different levels of care Information Needs to be Provided By: Team FYI   Decrease burden of Care through IP rehab admission: No  Possible need for SNF placement upon discharge: Not anticipated   Patient Condition: This patient's condition remains as documented in the consult dated 06/29/17, in which the Rehabilitation Physician determined and documented that the patient's condition is appropriate for intensive rehabilitative care in an inpatient rehabilitation facility. Will admit to inpatient rehab today.  Preadmission Screen Completed By:  Gunnar Fusi, 06/30/2017 12:56 PM ______________________________________________________________________   Discussed status with Dr. Naaman Plummer on 06/30/17 at 1322 and received telephone approval for admission today.  Admission Coordinator:  Gunnar Fusi, time 1322/Date 06/30/17             Cosigned by: Meredith Staggers, MD at 06/30/2017 1:42 PM  Revision History

## 2017-07-01 NOTE — Evaluation (Addendum)
Occupational Therapy Assessment and Plan  Patient Details  Name: Sara Robertson MRN: [id number] Date of Birth: 1918/04/21  OT Diagnosis: ataxia and muscle weakness (generalized) Rehab Potential: Rehab Potential (ACUTE ONLY): Good ELOS: 10-14 days    Today's Date: 07/01/2017 OT Individual Time: 8416  - 6063, 62 minutes        Problem List:  Patient Active Problem List   Diagnosis Date Noted  . Stroke (cerebrum) ([place]) 06/30/2017  . Cerebellar infarct ([place])   . Acute lower UTI   . PAF (paroxysmal atrial fibrillation) ([place])   . PAH (pulmonary artery hypertension) ([place])   . Benign essential HTN   . Diastolic dysfunction   . OSA (obstructive sleep apnea)   . Coronary artery disease involving native coronary artery of native heart without angina pectoris   . Acute blood loss anemia   . Acute CVA (cerebrovascular accident) ([place]) 06/28/2017  . Iron deficiency anemia 06/29/2015    Past Medical History:  Past Medical History:  Diagnosis Date  . Anemia   . Angina pectoris ([place])   . CAD (coronary artery disease)   . Diverticulosis   . GERD (gastroesophageal reflux disease)   . H/O carcinoid syndrome   . Headache   . History of tinnitus   . Hyperlipemia   . Hypertension   . OSA (obstructive sleep apnea)   . Peripheral neuropathy   . Pulmonary HTN ([place])    with noctural hypoxia?  . SOB (shortness of breath)    Past Surgical History:  Past Surgical History:  Procedure Laterality Date  . APPENDECTOMY    . BLADDER SUSPENSION    . CARDIAC CATHETERIZATION  2010  . CARPAL TUNNEL RELEASE Bilateral   . CHOLECYSTECTOMY    . LUMBAR LAMINECTOMY    . TONSILLECTOMY    . VAGINAL HYSTERECTOMY      Assessment & Plan Clinical Impression: Patient is a 83 y.o. year old female with history of HTN, OSA, CAD, SOB, recent UTI who was admitted on 06/28/2017 after found with right sided weakness and facial droop.History taken from chart review and patient.Weakness resolved enroute to  hospital but patient noted to be dysarthric. CT negative for acute process. CTA head/neck showed no large vessel occlusion, no basilar stenosis or dissection and evidence of pathologic mediastinal adenopathy. MRI brain reviewed, showing multifocal left cerebellar CVA. Per report, multiple acute small left cerebellar/SCA territory infarcts. 2 D echo showed EF 50-55% with no wall abnormality, moderate aortic stenosis, severely increased PAH and grade 2 diastolic dysfunction.   Telemetry showed intermittent A fib but family not interested in [facility] due to history of falls and patient's ASA increased to 325 mg daily. Macrodantin added for UTI. Patient with ataxic dysarthria, balance deficits and dizziness affecting mobility. She lives in [facility] facility and was independent with walker PTA. She was able to shower with assistance. She walks to the dinning room with a couple rest breaks due to SOB.Family is planning to hire supervision after discharge. CIR recommended due to functional deficits  Patient transferred to CIR on 06/30/2017 .    Patient currently requires min with basic self-care skills secondary to muscle weakness, ataxia and decreased coordination and decreased sitting balance, decreased standing balance, decreased postural control and decreased balance strategies.  Prior to hospitalization, patient could complete ADLs with modified independent  - supervision.  Patient will benefit from skilled intervention to decrease level of assist with basic self-care skills and increase independence with basic self-care skills prior to discharge home independently.  Anticipate patient will require 24 hour supervision and follow up home health.  OT - End of Session Activity Tolerance: Tolerates 30+ min activity with multiple rests Endurance Deficit: Yes Endurance Deficit Description: fatigues quickly with short duration mobility activities OT Assessment Rehab Potential (ACUTE ONLY):  Good OT Patient demonstrates impairments in the following area(s): Balance;Endurance;Motor;Safety OT Basic ADL's Functional Problem(s): Grooming;Bathing;Dressing;Toileting OT Transfers Functional Problem(s): Toilet;Tub/Shower OT Additional Impairment(s): None OT Plan OT Intensity: Minimum of 1-2 x/day, 45 to 90 minutes OT Frequency: 5 out of 7 days OT Duration/Estimated Length of Stay: 10-14 days  OT Treatment/Interventions: Balance/vestibular training;Discharge planning;DME/adaptive equipment instruction;Functional mobility training;Psychosocial support;Therapeutic Activities;UE/LE Strength taining/ROM;Wheelchair propulsion/positioning;UE/LE Coordination activities;Therapeutic Exercise;Self Care/advanced ADL retraining;Patient/family education;Disease mangement/prevention;Community reintegration OT Self Feeding Anticipated Outcome(s): independent OT Basic Self-Care Anticipated Outcome(s): supervision OT Toileting Anticipated Outcome(s): supervision OT Bathroom Transfers Anticipated Outcome(s): supervision  OT Recommendation Patient destination: Home((ILF)) Follow Up Recommendations: Other (comment)(TBD) Equipment Recommended: To be determined   Skilled Therapeutic Intervention Pt received supine in bed agreeable to OT treatment session. OT eval completed with explanation of CIR process, OT purpose, and POC. OT treatment completed with focus on ADL self care retraining. Pt completed stand pivot transfer EOB>w/c>BSC in shower, initially with ModA from EOB with RW, progressed to MinA to/from shower using grab bars. Pt completed showering at sit<>stand level with MinA provided in standing. Pt with incontinent BM during shower, with assist to wash buttocks, back, lower LEs and verbal cues to wash LUE. Pt completed UB/LB dressing seated in w/c with steadying assist/assist to advance pants over hips in standing at RW. Pt dons overhead shirt and socks with close supervision for safety. Pt left seated  in w/c, QRB donned, call bell and needs within reach.   OT Evaluation Precautions/Restrictions  Restrictions Weight Bearing Restrictions: No General   Vital Signs Therapy Vitals Temp: 98.1 F (36.7 C) Temp Source: Oral Pulse Rate: 64 Resp: 17 BP: (!) 157/64 Patient Position (if appropriate): Sitting Oxygen Therapy SpO2: 100 % O2 Device: Not Delivered Pain Pain Assessment Pain Assessment: No/denies pain Home Living/Prior Functioning Home Living Family/patient expects to be discharged to:: Other (Comment)(Independent living) Type of Home: Independent living facility Home Access: Level entry Home Layout: One level Bathroom Toilet: Standard Additional Comments: 2nd floor apartment but has elevator, she goes to [employer] 2x/day, only has fridge, no microwave or stove  Lives With: Alone IADL History Homemaking Responsibilities: No Prior Function Level of Independence: Independent with basic ADLs, Independent with gait, Independent with transfers  Able to Take Stairs?: No(did not need to d/t elevators) Driving: No Comments: Per previous chart review and Pt report, Pt uses 4ww, walking shower with seat, hand held shower and grabbars, pt dresses/baths self, amb to dinning hall on own, has toliet riser and grab bars around [employer]; does her own meds ADL ADL ADL Comments: Please see functional navigator  Vision Baseline Vision/History: Wears glasses Wears Glasses: At all times Patient Visual Report: No change from baseline Vision Assessment?: Yes Eye Alignment: Within Functional Limits Ocular Range of Motion: Within Functional Limits Tracking/Visual Pursuits: Able to track stimulus in all quads without difficulty Visual Fields: No apparent deficits Perception  Perception: (Pt required verbal cues to wash LUE; to be further assessed for neglect/inattention )    Cognition Overall Cognitive Status: Impaired/Different from baseline Arousal/Alertness:  Awake/alert Orientation Level: Person;Place;Situation Person: Oriented Place: Oriented Situation: Oriented Year: Other (Comment)(unable to state; begins stating year in the 1900's) Month: January Day of Week: Correct Memory: Impaired Memory Impairment: Decreased recall  of new information;Decreased short term memory Decreased Short Term Memory: Verbal basic;Functional basic Immediate Memory Recall: Sock;Blue;Bed Memory Recall: Bed Memory Recall Bed: Without Cue Attention: Sustained Sustained Attention: Appears intact Awareness: Impaired Awareness Impairment: Emergent impairment Problem Solving: Impaired Problem Solving Impairment: Verbal complex;Functional complex Safety/Judgment: Impaired Comments: some impulsivity and decreased safety awareness noted  Sensation Sensation Light Touch: Appears Intact Stereognosis: Not tested Hot/Cold: Not tested Proprioception: Appears Intact(90% accuracy at B hallux) Coordination Gross Motor Movements are Fluid and Coordinated: No Fine Motor Movements are Fluid and Coordinated: No Coordination and Movement Description: ataxic movements noted in L extremities Finger Nose Finger Test: dysmetria LUE >RUE Heel Shin Test: mild dysmetria LLE Motor  Motor Motor: Ataxia Motor - Skilled Clinical Observations: ataxic movements noted in LUE/LLE Mobility  Bed Mobility Bed Mobility: Supine to Sit;Sit to Supine Supine to Sit: 4: Min guard;HOB elevated Supine to Sit Details: Tactile cues for sequencing;Tactile cues for posture;Verbal cues for precautions/safety;Verbal cues for technique Sit to Supine: 4: Min guard;HOB elevated Sit to Supine - Details: Verbal cues for technique;Verbal cues for precautions/safety Transfers Transfers: Sit to Stand;Stand to Sit Sit to Stand: 4: Min assist;4: Min guard Sit to Stand Details: Tactile cues for weight shifting;Tactile cues for initiation;Tactile cues for posture;Verbal cues for precautions/safety;Verbal cues  for sequencing;Verbal cues for safe use of DME/AE Stand to Sit: 4: Min assist;4: Min guard Stand to Sit Details (indicate cue type and reason): Tactile cues for weight shifting;Tactile cues for posture;Tactile cues for placement;Verbal cues for precautions/safety;Verbal cues for safe use of DME/AE;Verbal cues for technique  Trunk/Postural Assessment  Cervical Assessment Cervical Assessment: Within Functional Limits Thoracic Assessment Thoracic Assessment: Exceptions to [facility] Lumbar Assessment Lumbar Assessment: Exceptions to [facility] Postural Control Postural Control: Deficits on evaluation Righting Reactions: delayed righting reactions   Balance Balance Balance Assessed: Yes Standardized Balance Assessment Standardized Balance Assessment: Timed Up and Go Test Timed Up and Go Test TUG: Normal TUG Normal TUG (seconds): 52(with RW) Static Sitting Balance Static Sitting - Balance Support: Feet unsupported Static Sitting - Level of Assistance: 5: Stand by assistance Dynamic Sitting Balance Dynamic Sitting - Balance Support: Feet unsupported;During functional activity Dynamic Sitting - Level of Assistance: 4: Min assist Static Standing Balance Static Standing - Balance Support: During functional activity;Left upper extremity supported;Bilateral upper extremity supported Static Standing - Level of Assistance: 4: Min assist;5: Stand by assistance Dynamic Standing Balance Dynamic Standing - Balance Support: During functional activity;No upper extremity supported Dynamic Standing - Level of Assistance: 4: Min assist Extremity/Trunk Assessment RUE Assessment RUE Assessment: Within Functional Limits LUE Assessment LUE Assessment: Exceptions to [facility] LUE Strength LUE Overall Strength Comments: ROM WFL; shoulder flexion grossly 4-/5; ataxic movements    See Function Navigator for Current Functional Status.   Refer to Care Plan for Long Term Goals  Recommendations for other services:  Therapeutic Recreation  Other TBD   Discharge Criteria: Patient will be discharged from OT if patient refuses treatment 3 consecutive times without medical reason, if treatment goals not met, if there is a change in medical status, if patient makes no progress towards goals or if patient is discharged from hospital.  The above assessment, treatment plan, treatment alternatives and goals were discussed and mutually agreed upon: by patient  Raymondo Band 07/01/2017, 4:42 PM

## 2017-07-01 NOTE — Evaluation (Signed)
Physical Therapy Assessment and Plan  Patient Details  Name: Sara Robertson MRN: [id number] Date of Birth: [birth year]  PT Diagnosis: Abnormality of gait, Ataxia, Ataxic gait, Coordination disorder and Difficulty walking Rehab Potential: Excellent ELOS: 12-14 days   Today's Date: 07/01/2017 PT Individual Time: 1300-1400 PT Individual Time Calculation (min): 60 min    Problem List:  Patient Active Problem List   Diagnosis Date Noted  . Stroke (cerebrum) ([place]) 06/30/2017  . Cerebellar infarct ([place])   . Acute lower UTI   . PAF (paroxysmal atrial fibrillation) ([place])   . PAH (pulmonary artery hypertension) ([place])   . Benign essential HTN   . Diastolic dysfunction   . OSA (obstructive sleep apnea)   . Coronary artery disease involving native coronary artery of native heart without angina pectoris   . Acute blood loss anemia   . Acute CVA (cerebrovascular accident) ([place]) 06/28/2017  . Iron deficiency anemia 06/29/2015    Past Medical History:  Past Medical History:  Diagnosis Date  . Anemia   . Angina pectoris ([place])   . CAD (coronary artery disease)   . Diverticulosis   . GERD (gastroesophageal reflux disease)   . H/O carcinoid syndrome   . Headache   . History of tinnitus   . Hyperlipemia   . Hypertension   . OSA (obstructive sleep apnea)   . Peripheral neuropathy   . Pulmonary HTN ([place])    with noctural hypoxia?  . SOB (shortness of breath)    Past Surgical History:  Past Surgical History:  Procedure Laterality Date  . APPENDECTOMY    . BLADDER SUSPENSION    . CARDIAC CATHETERIZATION  2010  . CARPAL TUNNEL RELEASE Bilateral   . CHOLECYSTECTOMY    . LUMBAR LAMINECTOMY    . TONSILLECTOMY    . VAGINAL HYSTERECTOMY      Assessment & Plan Clinical Impression: Sara Robertson a 83 y.o.femalewith history of HTN, OSA, CAD, SOB, recent UTI who was admitted on 06/28/2017 after found with right sided weakness and facial droop.History taken from chart review and  patient.Weakness resolved enroute to hospital but patient noted to be dysarthric. CT negative for acute process. CTA head/neck showed no large vessel occlusion, no basilar stenosis or dissection and evidence of pathologic mediastinal adenopathy. MRI brain reviewed, showing multifocal left cerebellar CVA. Per report, multiple acute small left cerebellar/SCA territory infarcts. 2 D echo showed EF 50-55% with no wall abnormality, moderate aortic stenosis, severely increased PAH and grade 2 diastolic dysfunction.   Telemetry showed intermittent A fib but family not interested in [facility] due to history of falls and patient's ASA increased to 325 mg daily. Macrodantin added for UTI. Patient with ataxic dysarthria, balance deficits and dizziness affecting mobility. She lives in [facility] facility and was independent with walker PTA. She was able to shower with assistance. She walks to the dinning room with a couple rest breaks due to SOB.Family is planning to hire supervision after discharge. CIR recommended due to functional deficits.    Patient transferred to CIR on 06/30/2017 .   Patient currently requires min with mobility secondary to decreased cardiorespiratoy endurance, impaired timing and sequencing, unbalanced muscle activation, ataxia and decreased coordination, decreased problem solving and delayed processing and decreased standing balance, decreased postural control and decreased balance strategies.  Prior to hospitalization, patient was modified independent  with mobility and lived with Alone in a Independent living facility home.  Home access is  Level entry.  Patient will benefit from skilled PT intervention  to maximize safe functional mobility, minimize fall risk and decrease caregiver burden for planned discharge home with 24 hour supervision.  Anticipate patient will benefit from follow up [place] at discharge.  PT - End of Session Activity Tolerance: Tolerates 30+ min activity with  multiple rests Endurance Deficit: Yes Endurance Deficit Description: fatigues quickly with short duration mobility activities PT Assessment Rehab Potential (ACUTE/IP ONLY): Excellent PT Patient demonstrates impairments in the following area(s): Balance;Endurance;Motor;Safety PT Transfers Functional Problem(s): Bed Mobility;Bed to Chair;Car;Furniture PT Locomotion Functional Problem(s): Ambulation;Wheelchair Mobility;Stairs PT Plan PT Intensity: Minimum of 1-2 x/day ,45 to 90 minutes PT Frequency: 5 out of 7 days PT Duration Estimated Length of Stay: 12-14 days PT Treatment/Interventions: Ambulation/gait training;Discharge planning;Functional mobility training;Psychosocial support;Therapeutic Activities;Wheelchair propulsion/positioning;Therapeutic Exercise;Neuromuscular re-education;Disease management/prevention;Balance/vestibular training;Cognitive remediation/compensation;DME/adaptive equipment instruction;UE/LE Strength taining/ROM;UE/LE Coordination activities;Stair training;Patient/family education;Community reintegration PT Transfers Anticipated Outcome(s): modI PT Locomotion Anticipated Outcome(s): S with LRAD PT Recommendation Recommendations for Other Services: Therapeutic Recreation consult Therapeutic Recreation Interventions: Pet therapy;Outing/community reintergration Follow Up Recommendations: Home health PT;24 hour supervision/assistance Patient destination: Home Equipment Recommended: To be determined Equipment Details: has rollator  Skilled Therapeutic Intervention Pt received seated in bed, denies pain and agreeable to treatment. PT initial evaluation performed and completed with minA overall as described below d/t ataxia and balance impairments. Educated pt on rehab process, goals, estimated LOS, etc to be determined after all evaluations completed. Pt remained seated in w/c at end of session, all needs in reach.   PT  Evaluation Precautions/Restrictions Precautions Precautions: Fall Restrictions Weight Bearing Restrictions: No General Chart Reviewed: Yes Response to Previous Treatment: Patient reporting fatigue but able to participate. Family/Caregiver Present: No Vital Signs Pain Pain Assessment Pain Assessment: No/denies pain Home Living/Prior Functioning Home Living Type of Home: Independent living facility Bathroom Toilet: Standard Additional Comments: 2nd floor apartment but has elevator, she goes to [employer] 2x/day, only has fridge, no microwave or stove  Lives With: Alone Prior Function Level of Independence: Independent with basic ADLs;Independent with gait;Independent with transfers Comments: Per previous chart review and Pt report, Pt uses 4ww, walking shower with seat, hand held shower and grabbars, pt dresses/baths self, amb to dinning hall on own, has toliet riser and grab bars around [employer]; does her own meds Vision/Perception  Vision - Assessment Eye Alignment: Within Functional Limits Ocular Range of Motion: Within Functional Limits Tracking/Visual Pursuits: Able to track stimulus in all quads without difficulty Perception Perception: (Pt required verbal cues to wash LUE; to be further assessed for neglect/inattention )  Cognition Overall Cognitive Status: Impaired/Different from baseline Arousal/Alertness: Awake/alert Orientation Level: Oriented to person;Oriented to place;Oriented to time;Oriented to situation Attention: Sustained Sustained Attention: Appears intact Memory: Impaired Memory Impairment: Decreased recall of new information;Decreased short term memory Decreased Short Term Memory: Verbal basic;Functional basic Awareness: Impaired Awareness Impairment: Emergent impairment Problem Solving: Impaired Problem Solving Impairment: Verbal complex;Functional complex Safety/Judgment: Impaired Comments: some impulsivity and decreased safety awareness noted   Sensation Sensation Light Touch: Appears Intact Stereognosis: Not tested Hot/Cold: Not tested Proprioception: Appears Intact(90% accuracy at B hallux) Coordination Gross Motor Movements are Fluid and Coordinated: No Fine Motor Movements are Fluid and Coordinated: No Coordination and Movement Description: ataxic movements noted in L extremities Finger Nose Finger Test: dysmetria LUE >RUE Heel Shin Test: mild dysmetria LLE Motor  Motor Motor: Ataxia Motor - Skilled Clinical Observations: ataxic movements noted in LUE/LLE  Mobility Bed Mobility Bed Mobility: Supine to Sit;Sit to Supine Supine to Sit: 4: Min guard;HOB elevated Supine to Sit Details: Tactile cues for sequencing;Tactile  cues for posture;Verbal cues for precautions/safety;Verbal cues for technique Sit to Supine: 4: Min guard;HOB elevated Sit to Supine - Details: Verbal cues for technique;Verbal cues for precautions/safety Transfers Transfers: Yes Sit to Stand: 4: Min assist;4: Min guard Sit to Stand Details: Tactile cues for weight shifting;Tactile cues for initiation;Tactile cues for posture;Verbal cues for precautions/safety;Verbal cues for sequencing;Verbal cues for safe use of DME/AE Stand to Sit: 4: Min assist;4: Min guard Stand to Sit Details (indicate cue type and reason): Tactile cues for weight shifting;Tactile cues for posture;Tactile cues for placement;Verbal cues for precautions/safety;Verbal cues for safe use of DME/AE;Verbal cues for technique Stand Pivot Transfers: 4: Min assist;With armrests Stand Pivot Transfer Details: Verbal cues for technique;Verbal cues for precautions/safety;Verbal cues for safe use of DME/AE Stand Pivot Transfer Details (indicate cue type and reason): with RW Locomotion  Ambulation Ambulation: Yes Ambulation/Gait Assistance: 4: Min guard;4: Min assist Ambulation Distance (Feet): 75 Feet Assistive device: Rolling walker Ambulation/Gait Assistance Details: Verbal cues for  technique;Verbal cues for precautions/safety;Verbal cues for safe use of DME/AE Gait Gait: Yes Gait Pattern: Impaired Gait Pattern: Ataxic;Poor foot clearance - left;Poor foot clearance - right;Narrow base of support;Lateral hip instability Gait velocity: decreased for age/gender norms Stairs / Additional Locomotion Stairs: Yes Stairs Assistance: 4: Min assist;4: Min guard Stairs Assistance Details: Verbal cues for precautions/safety;Verbal cues for technique Stair Management Technique: Two rails;Alternating pattern;Forwards Number of Stairs: 8 Height of Stairs: 3 Product manager Mobility: Geophysical data processor) Wheelchair Assistance: 5: Investment banker, operational Details: Verbal cues for Marketing executive: Both upper extremities Wheelchair Parts Management: Needs assistance Distance: 100'  Trunk/Postural Assessment  Cervical Assessment Cervical Assessment: Within Functional Limits Thoracic Assessment Thoracic Assessment: Exceptions to [facility] Lumbar Assessment Lumbar Assessment: Exceptions to [facility] Postural Control Postural Control: Deficits on evaluation Righting Reactions: delayed righting reactions   Balance Balance Balance Assessed: Yes Standardized Balance Assessment Standardized Balance Assessment: Timed Up and Go Test Timed Up and Go Test TUG: Normal TUG Normal TUG (seconds): 52(with RW) Static Sitting Balance Static Sitting - Balance Support: Feet unsupported Static Sitting - Level of Assistance: 5: Stand by assistance Dynamic Sitting Balance Dynamic Sitting - Balance Support: Feet unsupported;During functional activity Dynamic Sitting - Level of Assistance: 4: Min assist Static Standing Balance Static Standing - Balance Support: During functional activity;Left upper extremity supported;Bilateral upper extremity supported Static Standing - Level of Assistance: 4: Min assist;5: Stand by assistance Dynamic Standing Balance Dynamic  Standing - Balance Support: During functional activity;No upper extremity supported Dynamic Standing - Level of Assistance: 4: Min assist Extremity Assessment  RUE Assessment RUE Assessment: Within Functional Limits LUE Assessment LUE Assessment: Exceptions to [facility] RLE Assessment RLE Assessment: Within Functional Limits(grossly 4+/5 throughout) LLE Assessment LLE Assessment: Within Functional Limits(grossly 4+/5 throughout)   See Function Navigator for Current Functional Status.   Refer to Care Plan for Long Term Goals  Recommendations for other services: Therapeutic Recreation  Outing/community reintegration  Discharge Criteria: Patient will be discharged from PT if patient refuses treatment 3 consecutive times without medical reason, if treatment goals not met, if there is a change in medical status, if patient makes no progress towards goals or if patient is discharged from hospital.  The above assessment, treatment plan, treatment alternatives and goals were discussed and mutually agreed upon: by patient  Luberta Mutter 07/01/2017, 12:58 PM

## 2017-07-01 NOTE — Progress Notes (Signed)
Patient information reviewed and entered into eRehab system by Shavonne Ambroise, RN, CRRN, PPS Coordinator.  Information including medical coding and functional independence measure will be reviewed and updated through discharge.     Per nursing patient was given "Data Collection Information Summary for Patients in Inpatient Rehabilitation Facilities with attached "Privacy Act Statement-Health Care Records" upon admission.  

## 2017-07-02 ENCOUNTER — Inpatient Hospital Stay (HOSPITAL_COMMUNITY): Payer: Medicare Other | Admitting: Physical Therapy

## 2017-07-02 ENCOUNTER — Inpatient Hospital Stay (HOSPITAL_COMMUNITY): Payer: Medicare Other

## 2017-07-02 NOTE — IPOC Note (Signed)
Overall Plan of Care [facility]) Patient Details Name: Sara Robertson MRN: [id number] DOB: Jan 01, 1918  Admitting Diagnosis: <principal problem not specified> cerebellar infarct  Hospital Problems: Active Problems:   Stroke (cerebrum) ([place])   Cerebellar infarct ([place])     Functional Problem List: Nursing Bladder, Bowel, Motor, Safety, Skin Integrity  PT Balance, Endurance, Motor, Safety  OT Balance, Endurance, Motor, Safety  SLP    TR         Basic ADL's: OT Grooming, Bathing, Dressing, Toileting     Advanced  ADL's: OT       Transfers: PT Bed Mobility, Bed to Chair, Car, Manufacturing systems engineer, Metallurgist: PT Ambulation, Emergency planning/management officer, Stairs     Additional Impairments: OT None  SLP        TR      Anticipated Outcomes Item Anticipated Outcome  Self Feeding independent  Swallowing      Basic self-care  supervision  Toileting  supervision   Bathroom Transfers supervision   Bowel/Bladder  Continent to bowel and bladder with min. assist.  Transfers  modI  Locomotion  S with LRAD  Communication     Cognition     Pain  Less than 3,on 1 to 10 scale.  Safety/Judgment  Free from falls with mod. assist.   Therapy Plan: PT Intensity: Minimum of 1-2 x/day ,45 to 90 minutes PT Frequency: 5 out of 7 days PT Duration Estimated Length of Stay: 12-14 days OT Intensity: Minimum of 1-2 x/day, 45 to 90 minutes OT Frequency: 5 out of 7 days OT Duration/Estimated Length of Stay: 10-14 days       Team Interventions: Nursing Interventions Patient/Family Education, Bladder Management, Disease Management/Prevention  PT interventions Ambulation/gait training, Discharge planning, Functional mobility training, Psychosocial support, Therapeutic Activities, Wheelchair propulsion/positioning, Therapeutic Exercise, Neuromuscular re-education, Disease management/prevention, Training and development officer, Cognitive remediation/compensation, DME/adaptive  equipment instruction, UE/LE Strength taining/ROM, UE/LE Coordination activities, Stair training, Patient/family education, Community reintegration  OT Interventions Training and development officer, Discharge planning, DME/adaptive equipment instruction, Functional mobility training, Psychosocial support, Therapeutic Activities, UE/LE Strength taining/ROM, Wheelchair propulsion/positioning, UE/LE Coordination activities, Therapeutic Exercise, Self Care/advanced ADL retraining, Patient/family education, Disease mangement/prevention, Community reintegration  SLP Interventions    TR Interventions    SW/CM Interventions Discharge Planning, Psychosocial Support, Patient/Family Education   Barriers to Discharge MD  Medical stability and Incontinence  Nursing      PT      OT      SLP      SW       Team Discharge Planning: Destination: PT-Home ,OT- Home((ILF)) , SLP-  Projected Follow-up: PT-Home health PT, 24 hour supervision/assistance, OT-  Other (comment)(TBD), SLP-  Projected Equipment Needs: PT-To be determined, OT- To be determined, SLP-  Equipment Details: PT-has rollator, OT-  Patient/family involved in discharge planning: PT- Patient,  OT-Patient, SLP-   MD ELOS: 10-14 days Medical Rehab Prognosis:  Excellent Assessment: The patient has been admitted for CIR therapies with the diagnosis of cerebellar infarct. The team will be addressing functional mobility, strength, stamina, balance, safety, adaptive techniques and equipment, self-care, bowel and bladder mgt, patient and caregiver education, neuromuscular reeducation, vestibular assessment, balance, symptom management, ego support. Goals have been set at supervision for basic self-care and ADLs and supervision to modified independent with transfers and mobility.    Meredith Staggers, MD, FAAPMR     See Team Conference Notes for weekly updates to the plan of care

## 2017-07-02 NOTE — Progress Notes (Addendum)
Physical Therapy Session Note  Patient Details  Name: Sara Robertson MRN: [id number] Date of Birth: 1917-09-15  Today's Date: 07/02/2017 PT Individual Time: 1300-1415 PT Individual Time Calculation (min): 75 min   Short Term Goals: Week 1:  PT Short Term Goal 1 (Week 1): Pt will perform bed mobility with S from flat bed PT Short Term Goal 2 (Week 1): Pt will perform stand pivot transfers with S PT Short Term Goal 3 (Week 1): Pt will ambulate 150' with min guard PT Short Term Goal 4 (Week 1): Pt will ascend/descent four 6" steps with B handrails and minA for strengthening  Skilled Therapeutic Interventions/Progress Updates: Pt received seated in w/c, denies pain and agreeable to treatment. W/c propulsion x75' with BUE for strengthening and endurance. Stand pivot transfer minA to mat table. Static standing balance with dynamic UE reaching to retrieve and match playing cards to board; performed on level floor and on small red foam wedge to facilitate closed chain ankle dorsiflexion and righting reactions d/t observed posterior LOB consistently. Standing balance on airex foam pad min guard while removing cards from board; fatigues quickly and has occasional LOBs posteriorly but overall demos improved righting reactions. Pt with urgency to have bowel movement; returned to w/c with min guard. Transported to room totalA. Stand pivot transfer to/from toilet min guard; required min guard for dynamic standing balance while donning/doffing pants. Required setupA to retrieve clean clothing after incontinent bowel movement. Gait x150' with rollator and min guard. Sit >supine with S. Remained supine in bed at end of session, all needs in reach and alarm intact and four rails up per pt preference.     Therapy Documentation Precautions:  Precautions Precautions: Fall Restrictions Weight Bearing Restrictions: No Pain: Pain Assessment Pain Assessment: No/denies pain  See Function Navigator for Current  Functional Status.   Therapy/Group: Individual Therapy  Luberta Mutter 07/02/2017, 2:16 PM

## 2017-07-02 NOTE — Progress Notes (Signed)
Physical Therapy Session Note  Patient Details  Name: Sara Robertson MRN: [id number] Date of Birth: 1918-01-14  Today's Date: 07/02/2017 PT Individual Time: 1100-1200 PT Individual Time Calculation (min): 60 min   Short Term Goals: Week 1:  PT Short Term Goal 1 (Week 1): Pt will perform bed mobility with S from flat bed PT Short Term Goal 2 (Week 1): Pt will perform stand pivot transfers with S PT Short Term Goal 3 (Week 1): Pt will ambulate 150' with min guard PT Short Term Goal 4 (Week 1): Pt will ascend/descent four 6" steps with B handrails and minA for strengthening  Skilled Therapeutic Interventions/Progress Updates:    Patient in room reported need to have BM.  Sit to stand min A and gait with RW to bathroom min A.  Noted incontinent of part of movement and assist to change pants, clean clothing.  Patient needed assist for hygiene but donned underwear and pants sitting on BSC over toilet.  She needed assist to complete pulling up clothes due to reporting they were tight.  She propelled w/c partway to therapy gym.  In parallel bars performed stepping/placing feet activity on 4' steps with bilateral UE support. Sidestepping and over and back leading with L for placement due to ataxia.  Patient sit to supine on mat with S to perform LE therex including SLR, bridging, lateral trunk rolls.  To sit with min A.  Gait with rollator min A x 100' before fatigue.  Then pushed w/c 50' with S.  Left in w/c with quick release belt and all needs in reach.   Therapy Documentation Precautions:  Precautions Precautions: Fall Restrictions Weight Bearing Restrictions: No gns:  Pain: Pain Assessment Pain Assessment: No/denies pain  See Function Navigator for Current Functional Status.   Therapy/Group: Individual Therapy  Reginia Naas 07/02/2017, 1:35 PM

## 2017-07-02 NOTE — Progress Notes (Signed)
Occupational Therapy Session Note  Patient Details  Name: Sara Robertson MRN: [id number] Date of Birth: Jun 06, 1918  Today's Date: 07/02/2017 OT Individual Time: 1000-1055 OT Individual Time Calculation (min): 55 min    Short Term Goals: Week 1:  OT Short Term Goal 1 (Week 1): Pt will complete shower transfer with MinGuard assist. OT Short Term Goal 2 (Week 1): Pt will complete 3/3 steps of LB dressing with MinGuard assist.  OT Short Term Goal 3 (Week 1): Pt will complete standing grooming ADLs with MinGuard assist for dynamic balance.   Skilled Therapeutic Interventions/Progress Updates:    Pt resting in bed upon arrival.  Focus on BADL retraining including bathing at shower level and dressing with sit<>stand from w/c.  Pt required steady A for stand pivot transfer to shower.  Pt completed bathing task with assistance provided to bathe buttocks.  Pt stated she had difficulty bathing "back side" prior to this hospital stay.  Pt completed dressing tasks requiring assistance to pull pants over hips.  Pt able to don socks/shoe and fasten shoes.  Pt stated that the bathing/dressing tasks "exhausted" her but that it felt good to be able to complete tasks with minimal assistance.  Pt amb with RW in room for simple home mgmt tasks (min A). Pt remained in w/c with QRB in place.   Therapy Documentation Precautions:  Precautions Precautions: Fall Restrictions Weight Bearing Restrictions: No  Pain: Pain Assessment Pain Assessment: No/denies pain  See Function Navigator for Current Functional Status.   Therapy/Group: Individual Therapy  Leroy Libman 07/02/2017, 11:02 AM

## 2017-07-02 NOTE — Progress Notes (Signed)
[facility] PHYSICAL MEDICINE & REHABILITATION     PROGRESS NOTE    Subjective/Complaints: States she had a good evening.  Slept well.  Felt that therapy went fairly well also.  She still feels a bit "wobbly".  ROS: pt denies nausea, vomiting, diarrhea, cough, shortness of breath or chest pain   Objective: Vital Signs: Blood pressure (!) 150/52, pulse 60, temperature 97.7 F (36.5 C), temperature source Oral, resp. rate 16, height 5\' 4"  (1.626 m), weight 87 kg (191 lb 12.8 oz), SpO2 99 %. No results found. Recent Labs    07/01/17 0959  WBC 6.3  HGB 11.5*  HCT 34.4*  PLT 226   Recent Labs    07/01/17 0959  NA 138  K 3.8  CL 105  GLUCOSE 113*  BUN 10  CREATININE 0.78  CALCIUM 8.8*   CBG (last 3)  No results for input(s): GLUCAP in the last 72 hours.  Wt Readings from Last 3 Encounters:  07/01/17 87 kg (191 lb 12.8 oz)  06/28/17 86.7 kg (191 lb 2.2 oz)  11/30/16 83.9 kg (185 lb)    Physical Exam:  Constitutional: She is oriented to person, place, and time. She appears well-developed and well-nourished. No distress.  HENT:  Head: Normocephalic and atraumatic.  Mouth/Throat: Oropharynx is clear and moist.  Eyes: Conjunctivae and EOM are normal. Pupils are equal, round, and reactive to light. Right eye exhibits no discharge. Left eye exhibits no discharge.  Neck: Normal range of motion. Neck supple.  Cardiovascular: Irregularly irregular without JVD or murmur  Respiratory: normal effort, 2l oxygen via [place] GI: Soft. Bowel sounds are normal. She exhibits no distension. There is no tenderness.  Musculoskeletal: She exhibits no edema or tenderness.  Neurological: She is alert and oriented to person, place, and time.  Reasonable insight and awareness HOH.  Persistent dysarthria  sength remains grossly 4 out of 5 left upper and left lower extremity in 4 to 4+ out of 5 right upper and right lower extremity. Normal sensationrflexes are 1+ Skin: Skin is warm and dry. No  erythema.  Psychiatric: Pleasant and cooperative    Assessment/Plan: 1.  Functional and balance/gait deficits secondary to cerebellar infarct which require 3+ hours per day of interdisciplinary therapy in a comprehensive inpatient rehab setting. Physiatrist is providing close team supervision and 24 hour management of active medical problems listed below. Physiatrist and rehab team continue to assess barriers to discharge/monitor patient progress toward functional and medical goals.  Function:  Bathing Bathing position   Position: Shower  Bathing parts Body parts bathed by patient: Right arm, Left arm, Chest, Abdomen, Front perineal area, Right upper leg, Left upper leg Body parts bathed by helper: Right lower leg, Left lower leg, Back, Buttocks  Bathing assist Assist Level: (ModA)      Upper Body Dressing/Undressing Upper body dressing   What is the patient wearing?: Pull over shirt/dress     Pull over shirt/dress - Perfomed by patient: Thread/unthread right sleeve, Thread/unthread left sleeve, Put head through opening, Pull shirt over trunk          Upper body assist Assist Level: Supervision or verbal cues      Lower Body Dressing/Undressing Lower body dressing   What is the patient wearing?: Pants, Non-skid slipper socks     Pants- Performed by patient: Thread/unthread right pants leg, Thread/unthread left pants leg Pants- Performed by helper: Pull pants up/down Non-skid slipper socks- Performed by patient: Don/doff right sock, Don/doff left sock  Lower body assist Assist for lower body dressing: Touching or steadying assistance (Pt > 75%)      Toileting Toileting     Toileting steps completed by helper: Adjust clothing prior to toileting, Performs perineal hygiene, Adjust clothing after toileting Toileting Assistive Devices: Grab bar or rail  Toileting assist Assist level: Touching or steadying assistance (Pt.75%)    Transfers Chair/bed transfer Chair/bed transfer activity did not occur: Safety/medical concerns Chair/bed transfer method: Stand pivot Chair/bed transfer assist level: Touching or steadying assistance (Pt > 75%) Chair/bed transfer assistive device: Armrests, Medical sales representative     Max distance: 75 Assist level: Touching or steadying assistance (Pt > 75%)   Wheelchair   Type: Manual Max wheelchair distance: 100 Assist Level: Supervision or verbal cues  Cognition Comprehension Comprehension assist level: Set up assist with hearing device  Expression Expression assist level: Expresses basic needs/ideas: With no assist  Social Interaction Social Interaction assist level: Interacts appropriately 75 - 89% of the time - Needs redirection for appropriate language or to initiate interaction.  Problem Solving Problem solving assist level: Solves basic problems with no assist  Memory Memory assist level: Recognizes or recalls 75 - 89% of the time/requires cueing 10 - 24% of the time   Medical Problem List and Plan: 1. Functional and balance deficits secondary to left cerebellar infarct -Continue therapies 2. DVT Prophylaxis/Anticoagulation: Pharmaceutical: Lovenox 3. Pain Management: N/A 4. Mood: LCSW to follow for evaluation and support.  5. Neuropsych: This patient is capable of making decisions on her own behalf. 6. Skin/Wound Care: Routine pressure relief measures 7. Fluids/Electrolytes/Nutrition: Encourage p.o. intake     -Lab work all has been reviewed.  8. HTN: Monitor BP bid --continue Lasix and Cozaar daily.  9. Frequent UTIs: On macrodantin bid--patient with chronic urge incontinence but this is increased beyond her baseline.            -Timed toileting to reduce incontinence   -Fluid rationing as well to avoid increased output at night 10. Anemia: on iron supplement.  Hemoglobin holding at 11.5 11. Dyslipidemia: On Lipitor. 12. RLS: Managed with  tizanidine--continue to hold.  Monitor and resume if needed. Denies symptoms at present    LOS (Days) 2 A Porter T, MD 07/02/2017 7:34 AM

## 2017-07-02 NOTE — Progress Notes (Signed)
Recreational Therapy Session Note  Patient Details  Name: Sara Robertson MRN: [id number] Date of Birth: 12/23/1917 Today's Date: 07/02/2017  Pain: no c/o Skilled Therapeutic Interventions/Progress Updates: Met with pt briefly to discuss TR services.  Full eval deferred at this time.  Will monitor through team for participation in community reintegration.  [place] 07/02/2017, 11:28 AM

## 2017-07-02 NOTE — Progress Notes (Signed)
Responded to consult to support patient.  Patient not available. Chaplain will follow as needed.  Jaclynn Major, [place], [facility], Pager [phone]

## 2017-07-03 ENCOUNTER — Inpatient Hospital Stay (HOSPITAL_COMMUNITY): Payer: Medicare Other

## 2017-07-03 ENCOUNTER — Inpatient Hospital Stay (HOSPITAL_COMMUNITY): Payer: Medicare Other | Admitting: Physical Therapy

## 2017-07-03 MED ORDER — ENSURE ENLIVE PO LIQD
237.0000 mL | Freq: Two times a day (BID) | ORAL | Status: DC
  Administered 2017-07-03 – 2017-07-11 (×15): 237 mL via ORAL

## 2017-07-03 NOTE — Progress Notes (Signed)
[facility] Individual Statement of Services  Patient Name:  Sara Robertson  Date:  07/03/2017  Welcome to the [facility].  Our goal is to provide you with an individualized program based on your diagnosis and situation, designed to meet your specific needs.  With this comprehensive rehabilitation program, you will be expected to participate in at least 3 hours of rehabilitation therapies Monday-Friday, with modified therapy programming on the weekends.  Your rehabilitation program will include the following services:  Physical Therapy (PT), Occupational Therapy (OT), 24 hour per day rehabilitation nursing, Therapeutic Recreaction (TR), Neuropsychology, Case Management (Social Worker), Rehabilitation Medicine, Nutrition Services and Pharmacy Services  Weekly team conferences will be held on Tuesdays to discuss your progress.  Your Social Worker will talk with you frequently to get your input and to update you on team discussions.  Team conferences with you and your family in attendance may also be held.  Expected length of stay: 10 to 14 days  Overall anticipated outcome: Supervision  Depending on your progress and recovery, your program may change. Your Social Worker will coordinate services and will keep you informed of any changes. Your Social Worker's name and contact numbers are listed  below.  The following services may also be recommended but are not provided by the [facility] will be made to provide these services after discharge if needed.  Arrangements include referral to agencies that provide these services.  Your insurance has been verified to be:  Medicare and [employer] Your primary doctor is:  Dr. Fulton Reek  Pertinent information will be shared with your doctor and your insurance  company.  Social Worker:  Alfonse Alpers, LCSW  [phone] or [phone]  Information discussed with and copy given to patient by: Trey Sailors, 07/03/2017, 4:44 PM

## 2017-07-03 NOTE — Progress Notes (Signed)
Physical Therapy Session Note  Patient Details  Name: Sara Robertson MRN: [id number] Date of Birth: 1918/02/10  Today's Date: 07/03/2017 PT Individual Time: 1300-1345 PT Individual Time Calculation (min): 45 min   Short Term Goals: Week 1:  PT Short Term Goal 1 (Week 1): Pt will perform bed mobility with S from flat bed PT Short Term Goal 2 (Week 1): Pt will perform stand pivot transfers with S PT Short Term Goal 3 (Week 1): Pt will ambulate 150' with min guard PT Short Term Goal 4 (Week 1): Pt will ascend/descent four 6" steps with B handrails and minA for strengthening  Skilled Therapeutic Interventions/Progress Updates:   Pt seated in recliner in room, agreeable to participate in therapy session. Pt reports no pain this PM. Sit to stand with min assist to 4WW, ambulation to bathroom with min assist for balance due to some ataxia with gait. Toilet transfer min assist with use of grab bar. Ambulation 2 x 150 ft with 4WW and min assist, pt does fatigue quickly and requests seated rest break after 150 ft. Pt exhibits ataxic gait pattern with some path deviation noted. Standing balance activities in // bars: Romberg stance, R/L modified tandem stance with no UE support and min assist to maintain standing balance; side-steps with BUE support and v/c for upright posture; cone taps with R/L LE with v/c for limb and color cone to tap. Ambulation through therapy gym with [phone] with min assist while completing task of finding cones around the gym, focus on safe use of 4WW and locking brakes prior to reaching outside BOS for cones. Pt demos improved adherence to safe 4WW use after cues given. Pt left seated in recliner in room with needs in reach and conclusion of therapy session.  Therapy Documentation Precautions:  Precautions Precautions: Fall Restrictions Weight Bearing Restrictions: No   See Function Navigator for Current Functional Status.   Therapy/Group: Individual Therapy  Excell Seltzer, PT, DPT  07/03/2017, 1:54 PM

## 2017-07-03 NOTE — Progress Notes (Signed)
Occupational Therapy Session Note  Patient Details  Name: Sara Robertson MRN: [id number] Date of Birth: 11-Apr-1918  Today's Date: 07/03/2017 OT Individual Time: 5573-2202 OT Individual Time Calculation (min): 55 min    Short Term Goals: Week 1:  OT Short Term Goal 1 (Week 1): Pt will complete shower transfer with MinGuard assist. OT Short Term Goal 2 (Week 1): Pt will complete 3/3 steps of LB dressing with MinGuard assist.  OT Short Term Goal 3 (Week 1): Pt will complete standing grooming ADLs with MinGuard assist for dynamic balance.   Skilled Therapeutic Interventions/Progress Updates:    Pt sitting EOB upon arrival.  Pt declined bathing/dressing but agreeable to participating in therapy.  Pt amb with rollator to ADL apartment (2 rest breaks) and engaged in simple home mgmt tasks (transporting items, retrieving items from drawers, retrieving items from simulated dorm refrigerator). Pt amb with rollator to gym and engaged in dynamic standing activities.  Pt required multiple rest breaks during ambulation and standing tasks.  Pt returned to room and remained in w/c with all needs within reach.   Therapy Documentation Precautions:  Precautions Precautions: Fall Restrictions Weight Bearing Restrictions: No   Pain:  Pt denies pain  See Function Navigator for Current Functional Status.   Therapy/Group: Individual Therapy  Leroy Libman 07/03/2017, 10:00 AM

## 2017-07-03 NOTE — Progress Notes (Signed)
Physical Therapy Session Note  Patient Details  Name: Sara Robertson MRN: [id number] Date of Birth: 03/29/1918  Today's Date: 07/03/2017 PT Individual Time: 1100-1200 and 1415-1440  PT Individual Time Calculation (min): 60 min and 25 (total 85 min)  Short Term Goals: Week 1:  PT Short Term Goal 1 (Week 1): Pt will perform bed mobility with S from flat bed PT Short Term Goal 2 (Week 1): Pt will perform stand pivot transfers with S PT Short Term Goal 3 (Week 1): Pt will ambulate 150' with min guard PT Short Term Goal 4 (Week 1): Pt will ascend/descent four 6" steps with B handrails and minA for strengthening  Skilled Therapeutic Interventions/Progress Updates: Tx 1: Pt received seated in w/c, denies pain and agreeable to treatment. W/c propulsion x75' with BUE for strengthening and endurance. Performed gait through obstacle course with rollator and min guard. Pt noted to have had incontinent bladder accident. Returned to room totalA. Ambulation in/out of bathroom with rollator with min guard. Requires setupA and S for changing pants/underwear. Gait 2x125' with rollator and min guard; seated rest breaks between d/t fatigue. Ascent/descent 3" steps x8 with B handrails; x2 trials with min guard. One trial on 6" steps with B handrails, step-to pattern and min guard. Side stepping in parallel bars R/L with BUE>one UE>no UE; requires minA for no UE support trial. Stand pivot transfer to recliner with min guard. Remained seated in recliner at end of session, all needs in reach.   Tx 2: Pt received seated in recliner, denies pain but reports fatigue from several therapy sessions today. Agreeable to treatment. Ambulatory transfer with rollator and min guard recliner>w/c. Stand pivot transfer w/c <>nustep with min guard. Performed nustep x8 min with BUE/BLE for strengthening and endurance. Requires L hand strap to maintain grip on handle d/t incoordination. Ambulatory transfer to return to bed with rollator  and min guard. Pt doffs shoes with S, returned to supine with S. Remained supine in bed, 4 bedrails per pt request and all needs in reach.      Therapy Documentation Precautions:  Precautions Precautions: Fall Restrictions Weight Bearing Restrictions: No Pain: Pain Assessment Pain Assessment: No/denies pain   See Function Navigator for Current Functional Status.   Therapy/Group: Individual Therapy  Luberta Mutter 07/03/2017, 12:42 PM

## 2017-07-03 NOTE — Progress Notes (Signed)
[facility] PHYSICAL MEDICINE & REHABILITATION     PROGRESS NOTE    Subjective/Complaints: Slept well.  Intermittent dizziness at times but improving.  Denies any nausea or vomiting.  Feels that she is getting stronger with therapies  ROS: pt denies   diarrhea, cough, shortness of breath or chest pain   Objective: Vital Signs: Blood pressure 129/70, pulse 65, temperature 98.4 F (36.9 C), temperature source Oral, resp. rate 16, height 5\' 4"  (1.626 m), weight 87 kg (191 lb 12.8 oz), SpO2 96 %. No results found. Recent Labs    07/01/17 0959  WBC 6.3  HGB 11.5*  HCT 34.4*  PLT 226   Recent Labs    07/01/17 0959  NA 138  K 3.8  CL 105  GLUCOSE 113*  BUN 10  CREATININE 0.78  CALCIUM 8.8*   CBG (last 3)  No results for input(s): GLUCAP in the last 72 hours.  Wt Readings from Last 3 Encounters:  07/01/17 87 kg (191 lb 12.8 oz)  06/28/17 86.7 kg (191 lb 2.2 oz)  11/30/16 83.9 kg (185 lb)    Physical Exam:  Constitutional: She is oriented to person, place, and time. She appears well-developed and well-nourished. No distress.  HENT:  Head: Normocephalic and atraumatic.  Mouth/Throat: Oropharynx is clear and moist.  Eyes: Conjunctivae and EOM are normal. Pupils are equal, round, and reactive to light. Right eye exhibits no discharge. Left eye exhibits no discharge.  Neck: Normal range of motion. Neck supple.  Cardiovascular: .Irregularly irregular without JVD or murmur  Respiratory: Lungs clear to auscultation bilaterally GI: Soft. Bowel sounds are normal. She exhibits no distension. There is no tenderness.  Musculoskeletal: She exhibits no edema or tenderness.  Neurological: She is alert and oriented to person, place, and time.  Reasonable insight and awareness HOH.  Persistent dysarthria but intelligible. Srength remains grossly 4 out of 5 left upper and left lower extremity in 4 to 4+ out of 5 right upper and right lower extremity. Normal sensationrflexes are 1+and  symmetrical Skin: Skin is warm and dry. No erythema.  Psychiatric: Pleasant    Assessment/Plan: 1.  Functional and balance/gait deficits secondary to cerebellar infarct which require 3+ hours per day of interdisciplinary therapy in a comprehensive inpatient rehab setting. Physiatrist is providing close team supervision and 24 hour management of active medical problems listed below. Physiatrist and rehab team continue to assess barriers to discharge/monitor patient progress toward functional and medical goals.  Function:  Bathing Bathing position   Position: Shower  Bathing parts Body parts bathed by patient: Right arm, Left arm, Chest, Abdomen, Front perineal area, Right upper leg, Left upper leg, Right lower leg, Left lower leg Body parts bathed by helper: Buttocks  Bathing assist Assist Level: Touching or steadying assistance(Pt > 75%)      Upper Body Dressing/Undressing Upper body dressing   What is the patient wearing?: Pull over shirt/dress     Pull over shirt/dress - Perfomed by patient: Thread/unthread right sleeve, Thread/unthread left sleeve, Put head through opening, Pull shirt over trunk          Upper body assist Assist Level: Supervision or verbal cues      Lower Body Dressing/Undressing Lower body dressing   What is the patient wearing?: Underwear, Pants, Socks, Shoes Underwear - Performed by patient: Thread/unthread right underwear leg, Thread/unthread left underwear leg, Pull underwear up/down   Pants- Performed by patient: Thread/unthread right pants leg, Thread/unthread left pants leg Pants- Performed by helper: Pull pants up/down  Non-skid slipper socks- Performed by patient: Don/doff right sock, Don/doff left sock   Socks - Performed by patient: Don/doff right sock, Don/doff left sock   Shoes - Performed by patient: Don/doff right shoe, Don/doff left shoe, Fasten right, Fasten left            Lower body assist Assist for lower body dressing:  Touching or steadying assistance (Pt > 75%)      Toileting Toileting   Toileting steps completed by patient: Adjust clothing prior to toileting, Adjust clothing after toileting Toileting steps completed by helper: Performs perineal hygiene Toileting Assistive Devices: Grab bar or rail  Toileting assist Assist level: Touching or steadying assistance (Pt.75%)   Transfers Chair/bed transfer Chair/bed transfer activity did not occur: Safety/medical concerns Chair/bed transfer method: Stand pivot Chair/bed transfer assist level: Touching or steadying assistance (Pt > 75%) Chair/bed transfer assistive device: Armrests     Locomotion Ambulation     Max distance: 150 Assist level: Touching or steadying assistance (Pt > 75%)   Wheelchair   Type: Manual Max wheelchair distance: 60 Assist Level: Supervision or verbal cues  Cognition Comprehension Comprehension assist level: Set up assist with hearing device  Expression Expression assist level: Expresses basic needs/ideas: With no assist  Social Interaction Social Interaction assist level: Interacts appropriately 75 - 89% of the time - Needs redirection for appropriate language or to initiate interaction.  Problem Solving Problem solving assist level: Solves basic problems with no assist  Memory Memory assist level: Recognizes or recalls 75 - 89% of the time/requires cueing 10 - 24% of the time   Medical Problem List and Plan: 1. Functional and balance deficits secondary to left cerebellar infarct -Continue therapies.  Remains motivated 2. DVT Prophylaxis/Anticoagulation: Pharmaceutical: Lovenox 3. Pain Management: N/A 4. Mood: LCSW to follow for evaluation and support.  5. Neuropsych: This patient is capable of making decisions on her own behalf. 6. Skin/Wound Care: Routine pressure relief measures 7. Fluids/Electrolytes/Nutrition: Encourage p.o. intake     -P.o. intake is fair.   -Add dietary supplements 8. HTN:  Monitor BP bid --continue Lasix and Cozaar daily.  9. Frequent UTIs: On macrodantin bid--patient with chronic urge incontinence but this is increased beyond her baseline.            -Timed toileting to reduce incontinence   -Fluid rationing as well to avoid increased output at night was discussed again 10. Anemia: on iron supplement.  Hemoglobin holding at 11.5 11. Dyslipidemia: On Lipitor. 12. RLS: Managed with tizanidine--continue to hold.  Monitor and resume if needed. Denies symptoms at present    LOS (Days) 3 A Patrick T, MD 07/03/2017 9:32 AM

## 2017-07-03 NOTE — Progress Notes (Signed)
Social Work Assessment and Plan  Patient Details  Name: Sara Robertson MRN: [id number] Date of Birth: 1918-01-15  Today's Date: 07/02/2017  Problem List:  Patient Active Problem List   Diagnosis Date Noted  . 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 Cerebellar infarct ([place])   . Acute lower UTI   . PAF (paroxysmal atrial fibrillation) ([place])   . PAH (pulmonary artery hypertension) ([place])   . Benign essential HTN   . Diastolic dysfunction   . OSA (obstructive sleep apnea)   . Coronary artery disease involving native coronary artery of native heart without angina pectoris   . Acute blood loss anemia   . Acute CVA (cerebrovascular accident) ([place]) 06/28/2017  . Iron deficiency anemia 06/29/2015   Past Medical History:  Past Medical History:  Diagnosis Date  . Anemia   . Angina pectoris ([place])   . CAD (coronary artery disease)   . Diverticulosis   . GERD (gastroesophageal reflux disease)   . H/O carcinoid syndrome   . Headache   . History of tinnitus   . Hyperlipemia   . Hypertension   . OSA (obstructive sleep apnea)   . Peripheral neuropathy   . Pulmonary HTN ([place])    with noctural hypoxia?  . SOB (shortness of breath)    Past Surgical History:  Past Surgical History:  Procedure Laterality Date  . APPENDECTOMY    . BLADDER SUSPENSION    . CARDIAC CATHETERIZATION  2010  . CARPAL TUNNEL RELEASE Bilateral   . CHOLECYSTECTOMY    . LUMBAR LAMINECTOMY    . TONSILLECTOMY    . VAGINAL HYSTERECTOMY     Social History:  reports that  has never smoked. she has never used smokeless tobacco. She reports that she does not drink alcohol. Her drug history is not on file.  Family / Support Systems Marital Status: Widow/Widower Patient Roles: Parent, Other (Comment)(grandmother) Children: Kelli Hope - dtr [phone] (w); [phone] (h) Other Supports: 5 other children; grandchildren; friends; facility Anticipated Caregiver: Daughter arranging with family, hired help, and  facility  Ability/Limitations of Caregiver: Daughter, [name] works but is the only local daughter  Caregiver Availability: 24/7 Family Dynamics: close, supportive family, but only [name] is local, all others are out of town/state  Social History Preferred language: English Religion: Catholic Cultural Background: Catholic 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   Abuse/Neglect Abuse/Neglect Assessment Can Be Completed: Yes Physical Abuse: Denies Verbal Abuse: Denies Sexual Abuse: Denies Exploitation of patient/patient's resources: Denies Self-Neglect: Denies  Emotional Status Pt's affect, behavior and adjustment status: Pt reports being motivated to get better, yet feels she has lived a long, good life and is okay about "when my time comes." Recent Psychosocial Issues: Pt had a son who died in the last few years, as well as two sisters.  She acknowledges she's had to start saying goodbye to a lot of loved ones at her age.  Pt also acknowledged that she had to get used to leaving her home and moving to [place].  She has adjusted and likes it now. 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 Premorbid pt/family roles/activities: Pt  enjoys reading and spending time with dtr and friends.  Pt does not do much prior to noon at home and enjoys her own schedule. Anticipated changes in roles/activities/participation: Pt hopes to resume these as she is able. Pt/family expectations/goals: Pt wants to get back to her own apartment and knows she will need someone  to be with her there for a while.  Community [employer] Agencies: None Premorbid Home Care/DME Agencies: Other (Comment)(Pt resides at [facility] for the past year.) Transportation available at discharge: family Resource referrals recommended: Neuropsychology, Support group (specify)(stroke support group)  Discharge Planning Living Arrangements: Other (Comment)(assisted living facility) Support Systems: Children, Other relatives, Friends/neighbors, Home care staff Type of Residence: Assisted living Insurance Resources: [employer], Multimedia programmer (specify)([facility]) Financial Resources: [employer] Financial Screen Referred: No Living Expenses: Other (Comment) Money Management: Family(Dtr who has POA) Does the patient have any problems obtaining your medications?: No Home Management: ALF takes care of this Patient/Family Preliminary Plans: Pt plans to return to [place] where dtr and facility are working on finding someone to be with pt 24/7. Expected length of stay: 10 to 14 days  Clinical Impression CSW met with pt and spoke with pt's dtr via telephone to introduce self and role of CSW, as well as to complete assessment.  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  Pt's dtr is already working on finding staff to be with pt when she is discharged and CSW told them that team expects pt to be on CIR for 10 to 14 days.  Pt seems to be motivated and understands her condition.  No current questions/concerns/needs at this time.  CSW will continue to follow and assist as needed.   Jerrell Hart, [name] 07/03/2017, 4:36 PM

## 2017-07-04 ENCOUNTER — Inpatient Hospital Stay (HOSPITAL_COMMUNITY): Payer: Medicare Other | Admitting: Physical Therapy

## 2017-07-04 ENCOUNTER — Inpatient Hospital Stay (HOSPITAL_COMMUNITY): Payer: Medicare Other

## 2017-07-04 DIAGNOSIS — I69393 Ataxia following cerebral infarction: Principal | ICD-10-CM

## 2017-07-04 DIAGNOSIS — I63012 Cerebral infarction due to thrombosis of left vertebral artery: Secondary | ICD-10-CM

## 2017-07-04 NOTE — Progress Notes (Signed)
[facility] PHYSICAL MEDICINE & REHABILITATION     PROGRESS NOTE    Subjective/Complaints:   No issues overnite  ROS: pt denies   diarrhea, cough, shortness of breath or chest pain   Objective: Vital Signs: Blood pressure (!) 127/50, pulse (!) 50, temperature 98.1 F (36.7 C), temperature source Oral, resp. rate 16, height 5\' 4"  (1.626 m), weight 87 kg (191 lb 12.8 oz), SpO2 100 %. No results found. No results for input(s): WBC, HGB, HCT, PLT in the last 72 hours. No results for input(s): NA, K, CL, GLUCOSE, BUN, CREATININE, CALCIUM in the last 72 hours.  Invalid input(s): CO CBG (last 3)  No results for input(s): GLUCAP in the last 72 hours.  Wt Readings from Last 3 Encounters:  07/01/17 87 kg (191 lb 12.8 oz)  06/28/17 86.7 kg (191 lb 2.2 oz)  11/30/16 83.9 kg (185 lb)    Physical Exam:  Constitutional: She is oriented to person, place, and time. She appears well-developed and well-nourished. No distress.  HENT:  Head: Normocephalic and atraumatic.  Mouth/Throat: Oropharynx is clear and moist.  Eyes: Conjunctivae and EOM are normal. Pupils are equal, round, and reactive to light. Right eye exhibits no discharge. Left eye exhibits no discharge.  Neck: Normal range of motion. Neck supple.  Cardiovascular: .Irregularly irregular without JVD or murmur  Respiratory: Lungs clear to auscultation bilaterally GI: Soft. Bowel sounds are normal. She exhibits no distension. There is no tenderness.  Musculoskeletal: She exhibits no edema or tenderness.  Neurological: She is alert and oriented to person, place, and time.  Reasonable insight and awareness HOH.  Persistent dysarthria but intelligible. Srength remains grossly 4 out of 5 left upper and left lower extremity in 4 to 4+ out of 5 right upper and right lower extremity. Normal sensationrflexes are 1+and symmetrical Skin: Skin is warm and dry. No erythema.  Psychiatric: Pleasant    Assessment/Plan: 1.  Functional and  balance/gait deficits secondary to cerebellar infarct which require 3+ hours per day of interdisciplinary therapy in a comprehensive inpatient rehab setting. Physiatrist is providing close team supervision and 24 hour management of active medical problems listed below. Physiatrist and rehab team continue to assess barriers to discharge/monitor patient progress toward functional and medical goals.  Function:  Bathing Bathing position   Position: Shower  Bathing parts Body parts bathed by patient: Right arm, Left arm, Chest, Abdomen, Front perineal area, Right upper leg, Left upper leg, Right lower leg, Left lower leg Body parts bathed by helper: Buttocks  Bathing assist Assist Level: Touching or steadying assistance(Pt > 75%)      Upper Body Dressing/Undressing Upper body dressing   What is the patient wearing?: Pull over shirt/dress     Pull over shirt/dress - Perfomed by patient: Thread/unthread right sleeve, Thread/unthread left sleeve, Put head through opening, Pull shirt over trunk          Upper body assist Assist Level: Supervision or verbal cues      Lower Body Dressing/Undressing Lower body dressing   What is the patient wearing?: Underwear, Pants, Socks, Shoes Underwear - Performed by patient: Thread/unthread right underwear leg, Thread/unthread left underwear leg, Pull underwear up/down   Pants- Performed by patient: Thread/unthread right pants leg, Thread/unthread left pants leg Pants- Performed by helper: Pull pants up/down Non-skid slipper socks- Performed by patient: Don/doff right sock, Don/doff left sock   Socks - Performed by patient: Don/doff right sock, Don/doff left sock   Shoes - Performed by patient: Don/doff right shoe,  Don/doff left shoe, Fasten right, Fasten left            Lower body assist Assist for lower body dressing: Touching or steadying assistance (Pt > 75%)      Toileting Toileting Toileting activity did not occur: No continent  bowel/bladder event Toileting steps completed by patient: Adjust clothing prior to toileting, Adjust clothing after toileting Toileting steps completed by helper: Performs perineal hygiene Toileting Assistive Devices: Grab bar or rail  Toileting assist Assist level: Touching or steadying assistance (Pt.75%)   Transfers Chair/bed transfer Chair/bed transfer activity did not occur: Safety/medical concerns Chair/bed transfer method: Stand pivot Chair/bed transfer assist level: Touching or steadying assistance (Pt > 75%) Chair/bed transfer assistive device: Walker, Air cabin crew     Max distance: 150 ft Assist level: Touching or steadying assistance (Pt > 75%)   Wheelchair   Type: Manual Max wheelchair distance: 60 Assist Level: Supervision or verbal cues  Cognition Comprehension Comprehension assist level: Understands complex 90% of the time/cues 10% of the time  Expression Expression assist level: Expresses basic needs/ideas: With no assist  Social Interaction Social Interaction assist level: Interacts appropriately 75 - 89% of the time - Needs redirection for appropriate language or to initiate interaction.  Problem Solving Problem solving assist level: Solves basic problems with no assist  Memory Memory assist level: Recognizes or recalls 75 - 89% of the time/requires cueing 10 - 24% of the time   Medical Problem List and Plan: 1. Functional and balance deficits secondary to left cerebellar infarct -LUE and LLE ataxia and gait disorder 2. DVT Prophylaxis/Anticoagulation: Pharmaceutical: Lovenox 3. Pain Management: N/A 4. Mood: LCSW to follow for evaluation and support.  5. Neuropsych: This patient is capable of making decisions on her own behalf. 6. Skin/Wound Care: Routine pressure relief measures 7. Fluids/Electrolytes/Nutrition: Encourage p.o. intake     -P.o. intake is fair.   -Add dietary supplements 8. HTN: Monitor BP bid --continue  Lasix and Cozaar daily.  Controlled 12/8 Vitals:   07/03/17 1454 07/04/17 0415  BP:  (!) 127/50  Pulse: 67 (!) 50  Resp: 16 16  Temp: 97.8 F (36.6 C) 98.1 F (36.7 C)  SpO2: 99% 100%  9. Frequent UTIs: On macrodantin bid--prophyllactic           -Timed toileting to reduce incontinence   -Fluid rationing as well to avoid increased output at night was discussed again 10. Anemia: on iron supplement.  Hemoglobin holding at 11.5 11. Dyslipidemia: On Lipitor. 12. RLS: Managed with tizanidine--continue to hold.  Monitor and resume if needed. Denies symptoms at present    LOS (Days) [place] EVALUATION WAS PERFORMED  Charlett Blake, MD 07/04/2017 10:08 AM

## 2017-07-04 NOTE — Progress Notes (Signed)
Physical Therapy Session Note  Patient Details  Name: Sara Robertson MRN: [id number] Date of Birth: 05-10-1918  Today's Date: 07/04/2017 PT Individual Time: 1445-1530 PT Individual Time Calculation (min): 45 min   Short Term Goals: Week 1:  PT Short Term Goal 1 (Week 1): Pt will perform bed mobility with S from flat bed PT Short Term Goal 2 (Week 1): Pt will perform stand pivot transfers with S PT Short Term Goal 3 (Week 1): Pt will ambulate 150' with min guard PT Short Term Goal 4 (Week 1): Pt will ascend/descent four 6" steps with B handrails and minA for strengthening  Skilled Therapeutic Interventions/Progress Updates:  Pt was seen bedside in the pm. Pt performed bed mobility with S and side rail. Pt performed all transfers with S and [id number]. Pt ambulated 125, 50 and 175 feet with 4WW and S. In gym treatment focused NMR, utilizing cone taps and alternating cone taps 3 sets x 10 reps each. Pt returned to room and left sitting on edge of bed with call bell within reach.   Therapy Documentation Precautions:  Precautions Precautions: Fall Restrictions Weight Bearing Restrictions: No General:   Pain: No c/o pain.   See Function Navigator for Current Functional Status.   Therapy/Group: Individual Therapy  Dub Amis 07/04/2017, 3:44 PM

## 2017-07-04 NOTE — Progress Notes (Signed)
Occupational Therapy Session Note  Patient Details  Name: Sara Robertson MRN: [id number] Date of Birth: 19-Nov-1917  Today's Date: 07/04/2017 OT Individual Time: 1400-1445 OT Individual Time Calculation (min): 45 min    Short Term Goals: Week 1:  OT Short Term Goal 1 (Week 1): Pt will complete shower transfer with MinGuard assist. OT Short Term Goal 2 (Week 1): Pt will complete 3/3 steps of LB dressing with MinGuard assist.  OT Short Term Goal 3 (Week 1): Pt will complete standing grooming ADLs with MinGuard assist for dynamic balance.   Skilled Therapeutic Interventions/Progress Updates:    1;1. Pt seated in recliner ready to go. Pt ambulates throughout session with CGA with Rolator with Vc for safety awareness and locking brakes. Pt requires seated rest breaks while ambulating to tx destinations. Pt completes various standing activities such as pipe tree and decorating mini christmas tree for improved standing balance, endurance, [place] and cognition. Pt requires MOD question cues to locate errors in pipe tree figure assembly. Pt completes 2x30 passes of basketball while seated for improved BUE strength and endurance (chest, bounce and overhead passes) with supervision. Exited session with pt seated in bed with call light in reach and all needs met.   Therapy Documentation Precautions:  Precautions Precautions: Fall Restrictions Weight Bearing Restrictions: No  See Function Navigator for Current Functional Status.   Therapy/Group: Individual Therapy  Tonny Branch 07/04/2017, 2:40 PM

## 2017-07-04 NOTE — Progress Notes (Signed)
Physical Therapy Session Note  Patient Details  Name: Sara Robertson MRN: [id number] Date of Birth: Sep 22, 1917  Today's Date: 07/04/2017 PT Individual Time: 5176-1607 AND 1105-1200 PT Individual Time Calculation (min): 45 AND 55 min   Short Term Goals: Week 1:  PT Short Term Goal 1 (Week 1): Pt will perform bed mobility with S from flat bed PT Short Term Goal 2 (Week 1): Pt will perform stand pivot transfers with S PT Short Term Goal 3 (Week 1): Pt will ambulate 150' with min guard PT Short Term Goal 4 (Week 1): Pt will ascend/descent four 6" steps with B handrails and minA for strengthening  Skilled Therapeutic Interventions/Progress Updates:   Session 1:  Pt supine upon arrival, transferred to EOB w/ supervision and donned shoes w/ set-up assist. Worked on functional mobility and endurance w/ gait to/from dayroom w/ min guard fading to close supervision using rollator, 150' each way. Prolonged seated rest break 2/2 fatigue. Worked on dynamic standing balance in day room while performing UE reaching task w/ unilateral UE support on rollator. Encouraged pt to reach to edge of BOS. Returned to room and ended session sitting upright in recliner, call bell within reach and all needs met.   Session 2:  Pt sitting up in recliner and agreeable to therapy, no c/o pain. Ambulated to/from therapy gym, 150' w/ 1 seated rest break each time 2/2 fatigue and decreased quality of gait w/ prolonged ambulation. Supervision assist for ambulation this session. Worked on standing balance in gym w/o UE support on rollator to complete puzzle task, moderate cues to complete task. Worked on sit<>stands w/o UE assist, supervision fading to min assist as pt fatigued. Ambulated back to room and ended session in recliner, call bell within reach and all needs met.   Therapy Documentation Precautions:  Precautions Precautions: Fall Restrictions Weight Bearing Restrictions: No  See Function Navigator for Current  Functional Status.   Therapy/Group: Individual Therapy  Hydee Fleece K Arnette 07/04/2017, 12:56 PM

## 2017-07-05 DIAGNOSIS — R269 Unspecified abnormalities of gait and mobility: Secondary | ICD-10-CM

## 2017-07-05 DIAGNOSIS — I69398 Other sequelae of cerebral infarction: Secondary | ICD-10-CM

## 2017-07-05 NOTE — Progress Notes (Signed)
[facility] PHYSICAL MEDICINE & REHABILITATION     PROGRESS NOTE    Subjective/Complaints:  Eating breakfast  ROS: pt denies   diarrhea, cough, shortness of breath or chest pain   Objective: Vital Signs: Blood pressure (!) 160/75, pulse (!) 58, temperature 97.9 F (36.6 C), temperature source Oral, resp. rate 16, height 5\' 4"  (1.626 m), weight 87 kg (191 lb 12.8 oz), SpO2 100 %. No results found. No results for input(s): WBC, HGB, HCT, PLT in the last 72 hours. No results for input(s): NA, K, CL, GLUCOSE, BUN, CREATININE, CALCIUM in the last 72 hours.  Invalid input(s): CO CBG (last 3)  No results for input(s): GLUCAP in the last 72 hours.  Wt Readings from Last 3 Encounters:  07/01/17 87 kg (191 lb 12.8 oz)  06/28/17 86.7 kg (191 lb 2.2 oz)  11/30/16 83.9 kg (185 lb)    Physical Exam:  Constitutional: She is oriented to person, place, and time. She appears well-developed and well-nourished. No distress.  HENT:  Head: Normocephalic and atraumatic.  Mouth/Throat: Oropharynx is clear and moist.  Eyes: Conjunctivae and EOM are normal. Pupils are equal, round, and reactive to light. Right eye exhibits no discharge. Left eye exhibits no discharge.  Neck: Normal range of motion. Neck supple.  Cardiovascular: .Irregularly irregular without JVD or murmur  Respiratory: Lungs clear to auscultation bilaterally GI: Soft. Bowel sounds are normal. She exhibits no distension. There is no tenderness.  Musculoskeletal: She exhibits no edema or tenderness.  Neurological: She is alert and oriented to person, place, and time.  Reasonable insight and awareness HOH.  Persistent dysarthria but intelligible. Srength remains grossly 4 out of 5 left upper and left lower extremity in 4 to 4+ out of 5 right upper and right lower extremity. Normal sensationrflexes are 1+and symmetrical Skin: Skin is warm and dry. No erythema.  Psychiatric: Pleasant    Assessment/Plan: 1.  Functional and  balance/gait deficits secondary to cerebellar infarct which require 3+ hours per day of interdisciplinary therapy in a comprehensive inpatient rehab setting. Physiatrist is providing close team supervision and 24 hour management of active medical problems listed below. Physiatrist and rehab team continue to assess barriers to discharge/monitor patient progress toward functional and medical goals.  Function:  Bathing Bathing position   Position: Shower  Bathing parts Body parts bathed by patient: Right arm, Left arm, Chest, Abdomen, Front perineal area, Right upper leg, Left upper leg, Right lower leg, Left lower leg Body parts bathed by helper: Buttocks  Bathing assist Assist Level: Touching or steadying assistance(Pt > 75%)      Upper Body Dressing/Undressing Upper body dressing   What is the patient wearing?: Pull over shirt/dress     Pull over shirt/dress - Perfomed by patient: Thread/unthread right sleeve, Thread/unthread left sleeve, Put head through opening, Pull shirt over trunk          Upper body assist Assist Level: Supervision or verbal cues      Lower Body Dressing/Undressing Lower body dressing   What is the patient wearing?: Underwear, Pants, Socks, Shoes Underwear - Performed by patient: Thread/unthread right underwear leg, Thread/unthread left underwear leg, Pull underwear up/down   Pants- Performed by patient: Thread/unthread right pants leg, Thread/unthread left pants leg Pants- Performed by helper: Pull pants up/down Non-skid slipper socks- Performed by patient: Don/doff right sock, Don/doff left sock   Socks - Performed by patient: Don/doff right sock, Don/doff left sock   Shoes - Performed by patient: Don/doff right shoe, Don/doff left  shoe, Fasten right, Fasten left            Lower body assist Assist for lower body dressing: Touching or steadying assistance (Pt > 75%)      Toileting Toileting Toileting activity did not occur: No continent  bowel/bladder event Toileting steps completed by patient: Adjust clothing prior to toileting, Adjust clothing after toileting Toileting steps completed by helper: Performs perineal hygiene Toileting Assistive Devices: Grab bar or rail  Toileting assist Assist level: Touching or steadying assistance (Pt.75%)   Transfers Chair/bed transfer Chair/bed transfer activity did not occur: Safety/medical concerns Chair/bed transfer method: Ambulatory Chair/bed transfer assist level: Supervision or verbal cues Chair/bed transfer assistive device: Armrests, Medical sales representative     Max distance: 175 Assist level: Supervision or verbal cues   Wheelchair   Type: Manual Max wheelchair distance: 60 Assist Level: Supervision or verbal cues  Cognition Comprehension Comprehension assist level: Understands complex 90% of the time/cues 10% of the time  Expression Expression assist level: Expresses basic needs/ideas: With no assist  Social Interaction Social Interaction assist level: Interacts appropriately 75 - 89% of the time - Needs redirection for appropriate language or to initiate interaction.  Problem Solving Problem solving assist level: Solves basic problems with no assist  Memory Memory assist level: Recognizes or recalls 75 - 89% of the time/requires cueing 10 - 24% of the time   Medical Problem List and Plan: 1. Functional and balance deficits secondary to left cerebellar infarct -LUE and LLE ataxia and gait disorder 2. DVT Prophylaxis/Anticoagulation: Pharmaceutical: Lovenox 3. Pain Management: N/A 4. Mood: LCSW to follow for evaluation and support.  5. Neuropsych: This patient is capable of making decisions on her own behalf. 6. Skin/Wound Care: Routine pressure relief measures 7. Fluids/Electrolytes/Nutrition: Encourage p.o. intake     -P.o. intake is fair.   -Add dietary supplements 8. HTN: Monitor BP bid --continue Lasix and Cozaar daily.  Elevated  systolic 35/5 , monitor  Vitals:   07/04/17 0415 07/05/17 0516  BP: (!) 127/50 (!) 160/75  Pulse: (!) 50 (!) 58  Resp: 16 16  Temp: 98.1 F (36.7 C) 97.9 F (36.6 C)  SpO2: 100% 100%  9. Frequent UTIs: On macrodantin bid--prophyllactic           -Timed toileting to reduce incontinence   -Fluid rationing as well to avoid increased output at night was discussed again 10. Anemia: on iron supplement.  Hemoglobin holding at 11.5 11. Dyslipidemia: On Lipitor. 12. RLS: Managed with tizanidine--continue to hold.  Monitor and resume if needed. Denies symptoms at present    LOS (Days) [place] EVALUATION WAS PERFORMED  Charlett Blake, MD 07/05/2017 12:15 PM

## 2017-07-06 ENCOUNTER — Inpatient Hospital Stay (HOSPITAL_COMMUNITY): Payer: Medicare Other

## 2017-07-06 ENCOUNTER — Inpatient Hospital Stay (HOSPITAL_COMMUNITY): Payer: Medicare Other | Admitting: Physical Therapy

## 2017-07-06 ENCOUNTER — Inpatient Hospital Stay (HOSPITAL_COMMUNITY): Payer: Medicare Other | Admitting: Occupational Therapy

## 2017-07-06 NOTE — Progress Notes (Signed)
[facility] PHYSICAL MEDICINE & REHABILITATION     PROGRESS NOTE    Subjective/Complaints: Felt a little chilly last night but feeling better this morning Just waking up.    ROS: pt denies nausea, vomiting, diarrhea, cough, shortness of breath or chest pain   Objective: Vital Signs: Blood pressure (!) 153/97, pulse 67, temperature 97.9 F (36.6 C), temperature source Oral, resp. rate 16, height 5\' 4"  (1.626 m), weight 87 kg (191 lb 12.8 oz), SpO2 100 %. No results found. No results for input(s): WBC, HGB, HCT, PLT in the last 72 hours. No results for input(s): NA, K, CL, GLUCOSE, BUN, CREATININE, CALCIUM in the last 72 hours.  Invalid input(s): CO CBG (last 3)  No results for input(s): GLUCAP in the last 72 hours.  Wt Readings from Last 3 Encounters:  07/01/17 87 kg (191 lb 12.8 oz)  06/28/17 86.7 kg (191 lb 2.2 oz)  11/30/16 83.9 kg (185 lb)    Physical Exam:  Constitutional: She is oriented to person, place, and time. She appears well-developed and well-nourished. No distress.  HENT:  Head: Normocephalic and atraumatic.  Mouth/Throat: Oropharynx is clear and moist.  Eyes: Conjunctivae and EOM are normal. Pupils are equal, round, and reactive to light. Right eye exhibits no discharge. Left eye exhibits no discharge.  Neck: Normal range of motion. Neck supple.  Cardiovascular: Irregularly irregular without JVD or murmur  Respiratory: Lungs clear to auscultation bilaterally GI: Soft. Bowel sounds are normal. She exhibits no distension. There is no tenderness.  Musculoskeletal: She exhibits no edema or tenderness.  Neurological: She is alert and oriented to person, place, and time.  Reasonable insight and awareness HOH.  Persistent dysarthria but intelligible. Srength remains grossly 4 out of 5 left upper and left lower extremity in 4 to 4+ out of 5 right upper and right lower extremity. Normal sensationrflexes are 1+and symmetrical Skin: Skin is warm and dry. No erythema.   Psychiatric: Pleasant    Assessment/Plan: 1.  Functional and balance/gait deficits secondary to cerebellar infarct which require 3+ hours per day of interdisciplinary therapy in a comprehensive inpatient rehab setting. Physiatrist is providing close team supervision and 24 hour management of active medical problems listed below. Physiatrist and rehab team continue to assess barriers to discharge/monitor patient progress toward functional and medical goals.  Function:  Bathing Bathing position   Position: Shower  Bathing parts Body parts bathed by patient: Right arm, Left arm, Chest, Abdomen, Front perineal area, Right upper leg, Left upper leg, Right lower leg, Left lower leg Body parts bathed by helper: Buttocks  Bathing assist Assist Level: Touching or steadying assistance(Pt > 75%)      Upper Body Dressing/Undressing Upper body dressing   What is the patient wearing?: Pull over shirt/dress     Pull over shirt/dress - Perfomed by patient: Thread/unthread right sleeve, Thread/unthread left sleeve, Put head through opening, Pull shirt over trunk          Upper body assist Assist Level: Supervision or verbal cues      Lower Body Dressing/Undressing Lower body dressing   What is the patient wearing?: Underwear, Pants, Socks, Shoes Underwear - Performed by patient: Thread/unthread right underwear leg, Thread/unthread left underwear leg, Pull underwear up/down   Pants- Performed by patient: Thread/unthread right pants leg, Thread/unthread left pants leg Pants- Performed by helper: Pull pants up/down Non-skid slipper socks- Performed by patient: Don/doff right sock, Don/doff left sock   Socks - Performed by patient: Don/doff right sock, Don/doff left sock  Shoes - Performed by patient: Don/doff right shoe, Don/doff left shoe, Fasten right, Fasten left            Lower body assist Assist for lower body dressing: Touching or steadying assistance (Pt > 75%)       Toileting Toileting Toileting activity did not occur: No continent bowel/bladder event Toileting steps completed by patient: Adjust clothing prior to toileting, Adjust clothing after toileting Toileting steps completed by helper: Performs perineal hygiene Toileting Assistive Devices: Grab bar or rail  Toileting assist Assist level: Touching or steadying assistance (Pt.75%)   Transfers Chair/bed transfer Chair/bed transfer activity did not occur: Safety/medical concerns Chair/bed transfer method: Ambulatory Chair/bed transfer assist level: Supervision or verbal cues Chair/bed transfer assistive device: Armrests, Medical sales representative     Max distance: 175 Assist level: Supervision or verbal cues   Wheelchair   Type: Manual Max wheelchair distance: 60 Assist Level: Supervision or verbal cues  Cognition Comprehension Comprehension assist level: Understands complex 90% of the time/cues 10% of the time  Expression Expression assist level: Expresses basic needs/ideas: With no assist  Social Interaction Social Interaction assist level: Interacts appropriately 75 - 89% of the time - Needs redirection for appropriate language or to initiate interaction.  Problem Solving Problem solving assist level: Solves basic problems with no assist  Memory Memory assist level: Recognizes or recalls 75 - 89% of the time/requires cueing 10 - 24% of the time   Medical Problem List and Plan: 1. Functional and balance deficits secondary to left cerebellar infarct -LUE and LLE ataxia and gait disorder.     -Team conference tomorrow 2. DVT Prophylaxis/Anticoagulation: Pharmaceutical: Lovenox 3. Pain Management: N/A 4. Mood: LCSW to follow for evaluation and support.  5. Neuropsych: This patient is capable of making decisions on her own behalf. 6. Skin/Wound Care: Routine pressure relief measures 7. Fluids/Electrolytes/Nutrition: Encourage p.o. intake     -P.o. intake is  fair.   -Add dietary supplements 8. HTN: Monitor BP bid --continue Lasix and Cozaar daily.  Blood pressures have been somewhat labile over the last 24 hours.  Continue to observe.  Patient appears asymptomatic Vitals:   07/06/17 0912 07/06/17 1017  BP: (!) 88/48 (!) 153/97  Pulse: 67   Resp:    Temp:    SpO2:    9. Frequent UTIs: On macrodantin bid--prophylactic                -Timed toileting to reduce incontinence   -Fluid rationing as well to avoid increased output at night was discussed again 10. Anemia: on iron supplement.  Hemoglobin holding at 11.5 11. Dyslipidemia: On Lipitor. 12. RLS: Managed with tizanidine--continue to hold.  Monitor and resume if needed. Denies symptoms at present    LOS (Days) 6 A FACE TO FACE EVALUATION WAS PERFORMED  Meredith Staggers, MD 07/06/2017 3:47 PM

## 2017-07-06 NOTE — Progress Notes (Signed)
Occupational Therapy Session Note  Patient Details  Name: Sara Robertson MRN: [id number] Date of Birth: 04-18-1918  Today's Date: 07/06/2017 OT Individual Time: 1450-1534 OT Individual Time Calculation (min): 44 min   Short Term Goals: Week 1:  OT Short Term Goal 1 (Week 1): Pt will complete shower transfer with MinGuard assist. OT Short Term Goal 2 (Week 1): Pt will complete 3/3 steps of LB dressing with MinGuard assist.  OT Short Term Goal 3 (Week 1): Pt will complete standing grooming ADLs with MinGuard assist for dynamic balance.   Skilled Therapeutic Interventions/Progress Updates:    Tx focus on functional ambulation with device, standing tolerance, bilateral FMC, and problem solving during meaningful leisure engagement.   Pt greeted in w/c, watching group movie in dayroom. Agreeable to tx. Once escorted back to room, pt ambulated with 4WW and supervision to therapy gym. She required mod cues for DME safety, and 1 rest beak. Pt engaging in 24 piece jigsaw puzzle in standing and sitting positions. She required min questioning/instructional cues for organization and problem solving throughout task. Pt overall very attentive and actively engaged as this is a favorite hobby of hers. Min A provided at times for FM precision demands of task. When puzzle was completed/disassembled, she ambulated back to room with 4WW and supervision. Pt left in w/c with all needs within reach.    Therapy Documentation Precautions:  Precautions Precautions: Fall Restrictions Weight Bearing Restrictions: No Pain: No c/o pain during tx    ADL: ADL ADL Comments: Please see functional navigator      See Function Navigator for Current Functional Status.  Therapy/Group: Individual Therapy  Azusena Erlandson A Gennesis Hogland 07/06/2017, 4:23 PM

## 2017-07-06 NOTE — Progress Notes (Signed)
Physical Therapy Session Note  Patient Details  Name: Sara Robertson MRN: [id number] Date of Birth: 1917-09-22  Today's Date: 07/06/2017 PT Individual Time: 0092-3300 PT Individual Time Calculation (min): 44 min   Short Term Goals: Week 1:  PT Short Term Goal 1 (Week 1): Pt will perform bed mobility with S from flat bed PT Short Term Goal 2 (Week 1): Pt will perform stand pivot transfers with S PT Short Term Goal 3 (Week 1): Pt will ambulate 150' with min guard PT Short Term Goal 4 (Week 1): Pt will ascend/descent four 6" steps with B handrails and minA for strengthening  Skilled Therapeutic Interventions/Progress Updates:    Pt seated in w/c upon PT arrival, agreeable to therapy tx and denies pain. Pt reports feeling very tired as she did not sleep well last night. Pt transported to gym in w/c secondary to fatigue. Pt worked on dynamic standing balance this session without UE support to perform toe taps on 4 inch step, peg board puzzle while standing on foam, side stepping, and backwards stepping. Pt performed 2 x 5 sit<>stand for LE strengthening, without UE support. Pt ambulated x 100 ft, x 70 ft, 50 ft and x 75 ft with RW and supervision, seated rest breaks as needed secondary to fatigue. Pt left seated in w/c at end of session with needs in reach.   Therapy Documentation Precautions:  Precautions Precautions: Fall Restrictions Weight Bearing Restrictions: No   See Function Navigator for Current Functional Status.   Therapy/Group: Individual Therapy  Netta Corrigan, PT, DPT 07/06/2017, 12:37 PM

## 2017-07-07 ENCOUNTER — Inpatient Hospital Stay (HOSPITAL_COMMUNITY): Payer: Medicare Other | Admitting: Physical Therapy

## 2017-07-07 ENCOUNTER — Other Ambulatory Visit: Payer: Self-pay

## 2017-07-07 ENCOUNTER — Inpatient Hospital Stay (HOSPITAL_COMMUNITY): Payer: Medicare Other

## 2017-07-07 LAB — BASIC METABOLIC PANEL
Anion gap: 5 (ref 5–15)
BUN: 15 mg/dL (ref 6–20)
CHLORIDE: 102 mmol/L (ref 101–111)
CO2: 29 mmol/L (ref 22–32)
CREATININE: 0.89 mg/dL (ref 0.44–1.00)
Calcium: 8.7 mg/dL — ABNORMAL LOW (ref 8.9–10.3)
GFR calc Af Amer: >60 mL/min (ref >60)
GFR calc non Af Amer: 53 mL/min — ABNORMAL LOW (ref >60)
GLUCOSE: 104 mg/dL — AB (ref 65–99)
POTASSIUM: 4.1 mmol/L (ref 3.5–5.1)
SODIUM: 136 mmol/L (ref 135–145)

## 2017-07-07 LAB — CBC
HEMATOCRIT: 34 % — AB (ref 36.0–46.0)
Hemoglobin: 10.9 g/dL — ABNORMAL LOW (ref 12.0–15.0)
MCH: 30.4 pg (ref 26.0–34.0)
MCHC: 32.1 g/dL (ref 30.0–36.0)
MCV: 94.7 fL (ref 78.0–100.0)
PLATELETS: 275 10*3/uL (ref 150–400)
RBC: 3.59 MIL/uL — ABNORMAL LOW (ref 3.87–5.11)
RDW: 13.4 % (ref 11.5–15.5)
WBC: 8.1 10*3/uL (ref 4.0–10.5)

## 2017-07-07 NOTE — Progress Notes (Signed)
Physical Therapy Session Note  Patient Details  Name: Sara Robertson MRN: [id number] Date of Birth: 01/31/1918  Today's Date: 07/07/2017 PT Individual Time: 1000-1100 and 1300-1330 and 1430-1515 PT Individual Time Calculation (min): 60 min and 30 min and and 45 min (total 135 min)   Short Term Goals: Week 1:  PT Short Term Goal 1 (Week 1): Pt will perform bed mobility with S from flat bed PT Short Term Goal 2 (Week 1): Pt will perform stand pivot transfers with S PT Short Term Goal 3 (Week 1): Pt will ambulate 150' with min guard PT Short Term Goal 4 (Week 1): Pt will ascend/descent four 6" steps with B handrails and minA for strengthening  Skilled Therapeutic Interventions/Progress Updates: Tx 1: Pt received supine in bed, denies pain and agreeable to treatment. Ambulation within room to retrieve clothing and perform dressing sitting on EOB; increased time required. Ambulation in/out of bathroom with rollator and S; performed hygiene and clothing management with modI. Gait to/from gym with rollator and S x175' per trial; min cues for upright posture. Static standing balance on blue foam wedge while engaged in BUE puzzle for facilitation of ankle righting reactions, gastroc PROM and activity tolerance. Completed activity in sitting d/t fatigue in standing; min cues for problem solving of puzzle. Returned to room as above. Returned to supine at end of session; all needs in reach.   Tx 2: Pt received seated in w/c, reports pain in L knee which she reports is from walking more than normal this AM. Opted to perform seated w/c level exercises to reduce weight bearing and allow L knee time to rest. Provided pt with handout of LE strengthening exercises; performed sitting exercises (long arc quad, glute sets, hip adduction isometric, hip flexion marching) 1x15 each with 1# ankle weights. Remained seated in w/c at end of session with NT present, all needs in reach.   Tx 3: Pt received supine in bed,  denies pain and agreeable to treatment. Supine>sit modI. Transfer to w/c with S stand pivot. Pt assisted with putting up holiday decorations on unit including sit <>stand, gait with rollator, dynamic reaching and fine motor coordination. Dynamic gait in ADL apartment with rollator and S to retrieve numbered targets in order for weight shifting and dynamic reaching outside BOS; S overall. Bed mobility on flat bed in apartment with modI. W/c propulsion x50' with BUE for strengthening and endurance. Pt reporting fatigue and requesting to return to bed. Stand pivot transfer to bed with S. Sit>supine modI.  Remained supine in bed with CSW present at end of session, all needs in reach.      Therapy Documentation Precautions:  Precautions Precautions: Fall Restrictions Weight Bearing Restrictions: No General: PT Amount of Missed Time (min): 15 Minutes PT Missed Treatment Reason: Patient fatigue   See Function Navigator for Current Functional Status.   Therapy/Group: Individual Therapy  Luberta Mutter 07/07/2017, 10:59 AM

## 2017-07-07 NOTE — Progress Notes (Signed)
[facility] PHYSICAL MEDICINE & REHABILITATION     PROGRESS NOTE    Subjective/Complaints: No new issues today.  Slept well.  Denies any dizziness ROS: pt denies nausea, vomiting, diarrhea, cough, shortness of breath or chest pain   Objective: Vital Signs: Blood pressure (!) 145/55, pulse 64, temperature 97.7 F (36.5 C), temperature source Oral, resp. rate 16, height 5\' 4"  (1.626 m), weight 87 kg (191 lb 12.8 oz), SpO2 98 %. No results found. Recent Labs    07/07/17 0513  WBC 8.1  HGB 10.9*  HCT 34.0*  PLT 275   Recent Labs    07/07/17 0513  NA 136  K 4.1  CL 102  GLUCOSE 104*  BUN 15  CREATININE 0.89  CALCIUM 8.7*   CBG (last 3)  No results for input(s): GLUCAP in the last 72 hours.  Wt Readings from Last 3 Encounters:  07/01/17 87 kg (191 lb 12.8 oz)  06/28/17 86.7 kg (191 lb 2.2 oz)  11/30/16 83.9 kg (185 lb)    Physical Exam:  Constitutional: She is oriented to person, place, and time. She appears well-developed and well-nourished. No distress.  HENT:  Head: Normocephalic and atraumatic.  Mouth/Throat: Oropharynx is clear and moist.  Eyes: Conjunctivae and EOM are normal. Pupils are equal, round, and reactive to light. Right eye exhibits no discharge. Left eye exhibits no discharge.  Neck: Normal range of motion. Neck supple.  Cardiovascular: Irregularly irregular without JVD or murmur  Respiratory: CTA Bilaterally without wheezes or rales. Normal effort  GI: Soft. Bowel sounds are normal. She exhibits no distension. There is no tenderness.  Musculoskeletal: She exhibits no edema or tenderness.  Neurological: She is alert and oriented to person, place, and time.  Reasonable insight and awareness HOH.  Speech slightly dysarthric. Strength remains grossly 4 out of 5 left upper and left lower extremity in 4 to 4+ out of 5 right upper and right lower extremity. Normal sensationrflexes are 1+and symmetrical Skin: Skin is warm and dry. No erythema.   Psychiatric: Pleasant    Assessment/Plan: 1.  Functional and balance/gait deficits secondary to cerebellar infarct which require 3+ hours per day of interdisciplinary therapy in a comprehensive inpatient rehab setting. Physiatrist is providing close team supervision and 24 hour management of active medical problems listed below. Physiatrist and rehab team continue to assess barriers to discharge/monitor patient progress toward functional and medical goals.  Function:  Bathing Bathing position   Position: Shower  Bathing parts Body parts bathed by patient: Right arm, Left arm, Chest, Abdomen, Front perineal area, Right upper leg, Left upper leg, Right lower leg, Left lower leg Body parts bathed by helper: Buttocks  Bathing assist Assist Level: Touching or steadying assistance(Pt > 75%)      Upper Body Dressing/Undressing Upper body dressing   What is the patient wearing?: Pull over shirt/dress     Pull over shirt/dress - Perfomed by patient: Thread/unthread right sleeve, Thread/unthread left sleeve, Put head through opening, Pull shirt over trunk          Upper body assist Assist Level: Supervision or verbal cues      Lower Body Dressing/Undressing Lower body dressing   What is the patient wearing?: Underwear, Pants, Socks, Shoes Underwear - Performed by patient: Thread/unthread right underwear leg, Thread/unthread left underwear leg, Pull underwear up/down   Pants- Performed by patient: Thread/unthread right pants leg, Thread/unthread left pants leg Pants- Performed by helper: Pull pants up/down Non-skid slipper socks- Performed by patient: Don/doff right sock, Don/doff  left sock   Socks - Performed by patient: Don/doff right sock, Don/doff left sock   Shoes - Performed by patient: Don/doff right shoe, Don/doff left shoe, Fasten right, Fasten left            Lower body assist Assist for lower body dressing: Touching or steadying assistance (Pt > 75%)       Toileting Toileting Toileting activity did not occur: No continent bowel/bladder event Toileting steps completed by patient: Adjust clothing prior to toileting, Adjust clothing after toileting, Performs perineal hygiene Toileting steps completed by helper: Performs perineal hygiene Toileting Assistive Devices: Grab bar or rail  Toileting assist Assist level: Touching or steadying assistance (Pt.75%)   Transfers Chair/bed transfer Chair/bed transfer activity did not occur: Safety/medical concerns Chair/bed transfer method: Ambulatory Chair/bed transfer assist level: Supervision or verbal cues Chair/bed transfer assistive device: Armrests, Medical sales representative     Max distance: 100 ft Assist level: Supervision or verbal cues   Wheelchair   Type: Manual Max wheelchair distance: 60 Assist Level: Supervision or verbal cues  Cognition Comprehension Comprehension assist level: Understands complex 90% of the time/cues 10% of the time  Expression Expression assist level: Expresses basic needs/ideas: With no assist  Social Interaction Social Interaction assist level: Interacts appropriately 75 - 89% of the time - Needs redirection for appropriate language or to initiate interaction.  Problem Solving Problem solving assist level: Solves basic problems with no assist  Memory Memory assist level: Recognizes or recalls 75 - 89% of the time/requires cueing 10 - 24% of the time   Medical Problem List and Plan: 1. Functional and balance deficits secondary to left cerebellar infarct -LUE and LLE ataxia and gait disorder.     -Team conference today 2. DVT Prophylaxis/Anticoagulation: Pharmaceutical: Lovenox 3. Pain Management: N/A 4. Mood: LCSW to follow for evaluation and support.  5. Neuropsych: This patient is capable of making decisions on her own behalf. 6. Skin/Wound Care: Routine pressure relief measures 7. Fluids/Electrolytes/Nutrition: Encourage p.o. intake      -P.o. intake is fair.   -Add dietary supplements 8. HTN: Monitor BP bid --continue Lasix and Cozaar daily.  Blood pressures have been inconsistent at times but generally are stable to improved Vitals:   07/06/17 1635 07/07/17 0550  BP: 131/88 (!) 145/55  Pulse: 71 64  Resp: 17 16  Temp: 97.8 F (36.6 C) 97.7 F (36.5 C)  SpO2: 100% 98%  9. Frequent UTIs: On macrodantin bid--prophylactic                -Timed toileting to reduce incontinence has helped   -Fluid rationing as well to avoid increased output at night was discussed again 10. Anemia: on iron supplement.  Hemoglobin holding at 11.5 11. Dyslipidemia: On Lipitor. 12. RLS: Managed with tizanidine--continue to hold.  Monitor and resume if needed.     LOS (Days) 7 A FACE TO FACE EVALUATION WAS PERFORMED  Alger Simons T, MD 07/07/2017 9:30 AM

## 2017-07-07 NOTE — Progress Notes (Signed)
Social Work Patient ID: Sara Robertson, female   DOB: Sep 24, 1917, 83 y.o.   MRN: [id number]     Sara Robertson, Sara Robertson  Social Worker    Patient Care Conference  Signed  Date of Service:  07/07/2017  4:24 PM          Signed          [] Hide copied text  [] Hover for details   Inpatient RehabilitationTeam Conference and Plan of Care Update Date: 07/07/2017   Time: 2:00 PM      Patient Name: Sara Robertson      Medical Record Number: [id number]  Date of Birth: 1917-12-28 Sex: Female         Room/Bed: 4W25C/4W25C-01 Payor Info: Payor: MEDICARE / Plan: MEDICARE PART A AND B / Product Type: *No Product type* /     Admitting Diagnosis: CVA  Admit Date/Time:  06/30/2017  4:43 PM Admission Comments: No comment available    Primary Diagnosis:  <principal problem not specified> Principal Problem: <principal problem not specified>       Patient Active Problem List    Diagnosis Date Noted  . Stroke (cerebrum) ([place]) 06/30/2017  . Cerebellar infarct ([place])    . Acute lower UTI    . PAF (paroxysmal atrial fibrillation) ([place])    . PAH (pulmonary artery hypertension) ([place])    . Benign essential HTN    . Diastolic dysfunction    . OSA (obstructive sleep apnea)    . Coronary artery disease involving native coronary artery of native heart without angina pectoris    . Acute blood loss anemia    . Acute CVA (cerebrovascular accident) ([place]) 06/28/2017  . Iron deficiency anemia 06/29/2015      Expected Discharge Date: Expected Discharge Date: 07/11/17   Team Members Present: Physician leading conference: Dr. Alger Robertson Social Worker Present: Sara Alpers, LCSW Nurse Present: Sara Creamer, RN PT Present: Sara Robertson, PT OT Present: Sara Robertson, [place], OT PPS Coordinator present : Sara Nakayama, RN, CRRN       Current Status/Progress Goal Weekly Team Focus  Medical     Cerebellar infarct with ataxia.  Blood pressure has been labile at times but in  general is improving.  Mild anemia  Improve balance and functional mobility  BP management, symptom management stroke education   Bowel/Bladder     continent bowel/wears peri pad to manage stress incontinence.   Continent B&B  toilet to maintain continence   Swallow/Nutrition/ Hydration               ADL's     bathing-supervision; dressing-steady A; toileting-steady A; functonal transfers-supervision  supervision overall  functional transfers; activity tolerance; standing balance; BADL retraining, education   Mobility     min guard to S overall  modI bed mobility and transfers, S gait in home environment, S stairs for strengthening  activity tolerance, dynamic standing balance   Communication               Safety/Cognition/ Behavioral Observations   No unsafe behaviors, calling for assistance         Pain     without complaint of pain         Skin     No skin issues  remain free of breakdown  turning in bed and boosting in [facility]     Rehab Goals Patient on target to meet rehab goals: Yes Rehab Goals Revised: none *See Care Plan and progress notes for long and  short-term goals.      Barriers to Discharge   Current Status/Progress Possible Resolutions Date Resolved   Physician     Medical stability        Continue post stroke medical management.  Encourage adequate fluid intake to help stabilize blood pressures      Nursing                 PT                    OT                 SLP            SW              Discharge Planning/Teaching Needs:  Pt plans to return to her apartment at [place] with 24/7 supervision.  Pt will be independent to direct her care.   Team Discussion:  Pt with L cerebellar infarct and with up and down blood pressures, but otherwise doing well medically.  Nutrition is okay and main focus this week is balance and function.  Pt is doing great with PT and is min guard to close supervision.  They will continue to work on increasing  activity tolerance.  Pt still needs min A with LB dressing, but is getting close to goal level.  Both therapies have supervision level goals.  Speech is not seeing pt.  CSW brought concerns of pt's dtr to the team - fatigue, not eating/drinking enough, etc.  Nursing  plans to encourage fluids often.  Revisions to Treatment Plan:  none    Continued Need for Acute Rehabilitation Level of Care: The patient requires daily medical management by a physician with specialized training in physical medicine and rehabilitation for the following conditions: Daily direction of a multidisciplinary physical rehabilitation program to ensure safe treatment while eliciting the highest outcome that is of practical value to the patient.: Yes Daily medical management of patient stability for increased activity during participation in an intensive rehabilitation regime.: Yes Daily analysis of laboratory values and/or radiology reports with any subsequent need for medication adjustment of medical intervention for : Neurological problems;Blood pressure problems   Summar Sara Robertson, Sara Robertson 07/07/2017, 4:24 PM

## 2017-07-07 NOTE — Patient Care Conference (Signed)
Inpatient RehabilitationTeam Conference and Plan of Care Update Date: 07/07/2017   Time: 2:00 PM    Patient Name: Sara Robertson      Medical Record Number: [id number]  Date of Birth: 06-25-1918 Sex: Female         Room/Bed: 4W25C/4W25C-01 Payor Info: Payor: MEDICARE / Plan: MEDICARE PART A AND B / Product Type: *No Product type* /    Admitting Diagnosis: CVA  Admit Date/Time:  06/30/2017  4:43 PM Admission Comments: No comment available   Primary Diagnosis:  <principal problem not specified> Principal Problem: <principal problem not specified>  Patient Active Problem List   Diagnosis Date Noted  . Stroke (cerebrum) ([place]) 06/30/2017  . Cerebellar infarct ([place])   . Acute lower UTI   . PAF (paroxysmal atrial fibrillation) ([place])   . PAH (pulmonary artery hypertension) ([place])   . Benign essential HTN   . Diastolic dysfunction   . OSA (obstructive sleep apnea)   . Coronary artery disease involving native coronary artery of native heart without angina pectoris   . Acute blood loss anemia   . Acute CVA (cerebrovascular accident) ([place]) 06/28/2017  . Iron deficiency anemia 06/29/2015    Expected Discharge Date: Expected Discharge Date: 07/11/17  Team Members Present: Physician leading conference: Dr. Alger Simons Social Worker Present: Alfonse Alpers, LCSW Nurse Present: Junius Creamer, RN PT Present: Kem Parkinson, PT OT Present: Roanna Epley, [place], OT PPS Coordinator present : Daiva Nakayama, RN, CRRN     Current Status/Progress Goal Weekly Team Focus  Medical   Cerebellar infarct with ataxia.  Blood pressure has been labile at times but in general is improving.  Mild anemia  Improve balance and functional mobility  BP management, symptom management stroke education   Bowel/Bladder   continent bowel/wears peri pad to manage stress incontinence.   Continent B&B  toilet to maintain continence   Swallow/Nutrition/ Hydration             ADL's   bathing-supervision; dressing-steady A; toileting-steady A; functonal transfers-supervision  supervision overall  functional transfers; activity tolerance; standing balance; BADL retraining, education   Mobility   min guard to S overall  modI bed mobility and transfers, S gait in home environment, S stairs for strengthening  activity tolerance, dynamic standing balance   Communication             Safety/Cognition/ Behavioral Observations  No unsafe behaviors, calling for assistance         Pain   without complaint of pain         Skin   No skin issues  remain free of breakdown  turning in bed and boosting in [facility]    Rehab Goals Patient on target to meet rehab goals: Yes Rehab Goals Revised: none *See Care Plan and progress notes for long and short-term goals.     Barriers to Discharge  Current Status/Progress Possible Resolutions Date Resolved   Physician    Medical stability        Continue post stroke medical management.  Encourage adequate fluid intake to help stabilize blood pressures      Nursing                  PT                    OT                  SLP  SW                Discharge Planning/Teaching Needs:  Pt plans to return to her apartment at [place] with 24/7 supervision.  Pt will be independent to direct her care.   Team Discussion:  Pt with L cerebellar infarct and with up and down blood pressures, but otherwise doing well medically.  Nutrition is okay and main focus this week is balance and function.  Pt is doing great with PT and is min guard to close supervision.  They will continue to work on increasing activity tolerance.  Pt still needs min A with LB dressing, but is getting close to goal level.  Both therapies have supervision level goals.  Speech is not seeing pt.  CSW brought concerns of pt's dtr to the team - fatigue, not eating/drinking enough, etc.  Nursing  plans to encourage fluids often.  Revisions to Treatment  Plan:  none    Continued Need for Acute Rehabilitation Level of Care: The patient requires daily medical management by a physician with specialized training in physical medicine and rehabilitation for the following conditions: Daily direction of a multidisciplinary physical rehabilitation program to ensure safe treatment while eliciting the highest outcome that is of practical value to the patient.: Yes Daily medical management of patient stability for increased activity during participation in an intensive rehabilitation regime.: Yes Daily analysis of laboratory values and/or radiology reports with any subsequent need for medication adjustment of medical intervention for : Neurological problems;Blood pressure problems  Denis Koppel, [name] 07/07/2017, 4:24 PM

## 2017-07-07 NOTE — Progress Notes (Signed)
Occupational Therapy Session Note  Patient Details  Name: Sara Robertson MRN: [id number] Date of Birth: 06-14-1918  Today's Date: 07/07/2017 OT Individual Time: 1115-1200 OT Individual Time Calculation (min): 45 min    Short Term Goals: Week 1:  OT Short Term Goal 1 (Week 1): Pt will complete shower transfer with MinGuard assist. OT Short Term Goal 2 (Week 1): Pt will complete 3/3 steps of LB dressing with MinGuard assist.  OT Short Term Goal 3 (Week 1): Pt will complete standing grooming ADLs with MinGuard assist for dynamic balance.   Skilled Therapeutic Interventions/Progress Updates:    Pt resting in bed upon arrival.  Pt declined bathing and changing clothes this morning.  Focus on functional amb with Rollator, standing balance, activity tolerance, and safety awareness.  Pt amb to ADL apartment and engaged in simple home mgmt tasks to challenge balance and safety.  Pt requires multiple rest breaks during session.  Pt requires min verbal cues for sequencing with stand>sit.  Pt returned to room and requested to sit in w/c.  Pt remained in w/c with all needs within reach.   Therapy Documentation Precautions:  Precautions Precautions: Fall Restrictions Weight Bearing Restrictions: No  Pain:  Pt denies pain  See Function Navigator for Current Functional Status.   Therapy/Group: Individual Therapy  Leroy Libman 07/07/2017, 12:06 PM

## 2017-07-08 ENCOUNTER — Inpatient Hospital Stay (HOSPITAL_COMMUNITY): Payer: Medicare Other

## 2017-07-08 ENCOUNTER — Inpatient Hospital Stay (HOSPITAL_COMMUNITY): Payer: Medicare Other | Admitting: Physical Therapy

## 2017-07-08 MED ORDER — NITROFURANTOIN MONOHYD MACRO 100 MG PO CAPS
100.0000 mg | ORAL_CAPSULE | Freq: Two times a day (BID) | ORAL | Status: DC
  Administered 2017-07-08 – 2017-07-11 (×6): 100 mg via ORAL
  Filled 2017-07-08 (×6): qty 1

## 2017-07-08 MED ORDER — ACETAMINOPHEN 325 MG PO TABS: 325.0000 mg | ORAL_TABLET | ORAL | Status: AC | PRN

## 2017-07-08 NOTE — Progress Notes (Signed)
Physical Therapy Session Note  Patient Details  Name: Sara Robertson MRN: [id number] Date of Birth: [birth year]  Today's Date: 07/08/2017 PT Individual Time: 1100-1155 PT Individual Time Calculation (min): 55 min   Short Term Goals: Week 1:  PT Short Term Goal 1 (Week 1): Pt will perform bed mobility with S from flat bed PT Short Term Goal 2 (Week 1): Pt will perform stand pivot transfers with S PT Short Term Goal 3 (Week 1): Pt will ambulate 150' with min guard PT Short Term Goal 4 (Week 1): Pt will ascend/descent four 6" steps with B handrails and minA for strengthening  Skilled Therapeutic Interventions/Progress Updates: Pt received seated in recliner, denies pain and agreeable to treatment. Gait in/out of bathroom with rollator and close S; performs hygiene and clothing management with S. Transported to/from gym in w/c totalA for energy conservation. Stand pivot transfer min guard throughout session. Standing balance on red foam wedge while performing dynamic UE overhead reaching to retrieve and match cards for focus on closed chain dorsiflexor activation and righting reactions; several posterior LOBs requiring assist to slow eccentric control to safely sit on mat table. Standing alternating toe taps to 3" step with light one UE support from therapist. Standing side steps over trekking pole with BUE support from therapist. Sit <>supine with S. Bridging and sidelying hip abduction clamshell 2x10 reps each, min cues for technique. Returned to room as above; transferred back to bed with S. Remained supine in bed at end of session, all needs in reach.      Therapy Documentation Precautions:  Precautions Precautions: Fall Restrictions Weight Bearing Restrictions: No   See Function Navigator for Current Functional Status.   Therapy/Group: Individual Therapy  Luberta Mutter 07/08/2017, 11:58 AM

## 2017-07-08 NOTE — Discharge Summary (Signed)
Physician Discharge Summary  Patient ID: Sara Robertson MRN: [id number] DOB/AGE: 03/10/1918 83 y.o.  Admit date: 06/30/2017 Discharge date: 07/11/2017  Discharge Diagnoses:  Principal Problem:   Cerebellar infarct [facility]) Active Problems:   Iron deficiency anemia   Benign essential HTN   Diastolic dysfunction   Discharged Condition: stable   Significant Diagnostic Studies: N/A   Labs:  Basic Metabolic Panel: BMP Latest Ref Rng & Units 07/07/2017 07/01/2017 06/29/2017  Glucose 65 - 99 mg/dL 104(H) 113(H) 112(H)  BUN 6 - 20 mg/dL 15 10 12   Creatinine 0.44 - 1.00 mg/dL 0.89 0.78 0.74  Sodium 135 - 145 mmol/L 136 138 135  Potassium 3.5 - 5.1 mmol/L 4.1 3.8 3.8  Chloride 101 - 111 mmol/L 102 105 103  CO2 22 - 32 mmol/L 29 25 27   Calcium 8.9 - 10.3 mg/dL 8.7(L) 8.8(L) 8.6(L)    CBC: CBC Latest Ref Rng & Units 07/07/2017 07/01/2017 06/29/2017  WBC 4.0 - 10.5 K/uL 8.1 6.3 6.8  Hemoglobin 12.0 - 15.0 g/dL 10.9(L) 11.5(L) 10.3(L)  Hematocrit 36.0 - 46.0 % 34.0(L) 34.4(L) 31.0(L)  Platelets 150 - 400 K/uL 275 226 202    CBG: No results for input(s): GLUCAP in the last 168 hours.  Brief HPI:   Sara Robertson a 83 y.o.femalewith history of HTN, OSA, CAD, SOB, recent UTI who was admitted on 06/28/2017 after found with right sided weakness and facial droop.Weakness resolved enroute to hospital but patient noted to be dysarthric. CT negative for acute process. CTA head/neck showed no large vessel occlusion, no basilar stenosis or dissection and evidence of pathologic mediastinal adenopathy. MRI brain reviewed, showing multifocal left cerebellar CVA. Telemetry showed intermittent A fib but family not interested in [facility] due to history of falls and patient's ASA increased to 325 mg daily. Patient with ataxic dysarthria, balance deficits and dizziness affecting mobility.  CIR was recommended due to functional deficits   Hospital Course: Sara Robertson was admitted to rehab  06/30/2017 for inpatient therapies to consist of PT and OT at least three hours five days a week. Past admission physiatrist, therapy team and rehab RN have worked together to provide customized collaborative inpatient rehab.  Blood pressures have been monitored on bid basis and has been controlled on Lasix and Cozaar. She was maintained on ASA daily and is tolerating this without difficulty. Follow up labs showed H/H and renal status has been stable. She was maintained on home dose of macrodantin due to reports of chronic UTIs. She is continent of bowel and bladder. Her gait quality has improved and she has progressed to supervision level. She will continue to receive follow up HHPT and [facility] in-home rehab after discharge.    Rehab course: During patient's stay in rehab weekly team conferences were held to monitor patient's progress, set goals and discuss barriers to discharge.  At admission, patient required min assist with mobility and ADL tasks. Speech therapy evaluation showed cognitive linguistic function to be grossly intact. Speech was fluent and free from word finding deficits and no further therapy needed. She has had improvement in activity tolerance, balance, postural control, as well as ability to compensate for deficits. She is able to complete ADL tasks with multiple rest breaks and supervision. She requires supervision with transfers and is able to ambulate 100' with RW and supervision. Family was educated on current needs and has hired help to provide supervision after discharge.     Disposition: Independent living facility  Diet: Heart Healthy.  Special Instructions: 1. Needs supervision. 2. Drink plenty of fluids.   Discharge Instructions    Ambulatory referral to Physical Medicine Rehab   Complete by:  As directed    1-2 weeks transitional care appt     Allergies as of 07/11/2017      Reactions   Celebrex [celecoxib] Nausea And Vomiting   Motrin [ibuprofen]  Nausea And Vomiting   Nexium [esomeprazole Magnesium] Nausea And Vomiting      Medication List    TAKE these medications   acetaminophen 325 MG tablet Commonly known as:  TYLENOL Take 1-2 tablets (325-650 mg total) by mouth every 4 (four) hours as needed for mild pain.   aspirin 325 MG tablet Take 1 tablet (325 mg total) by mouth daily. Notes to patient:  Over the counter   atorvastatin 20 MG tablet Commonly known as:  LIPITOR Take 1 tablet (20 mg total) by mouth daily at 6 PM.   cyanocobalamin 500 MCG tablet Take 1 tablet (500 mcg total) by mouth daily. Notes to patient:  Is over the counter   ferrous sulfate 325 (65 FE) MG tablet Take 1 tablet (325 mg total) by mouth daily with breakfast.   furosemide 40 MG tablet Commonly known as:  LASIX Take 0.5 tablets (20 mg total) by mouth daily.   losartan 50 MG tablet Commonly known as:  COZAAR Take 1 tablet (50 mg total) by mouth daily.   multivitamin with minerals tablet Take 1 tablet by mouth daily.   PRESERVISION AREDS 2 PO Take 1 capsule by mouth daily. Notes to patient:  Is over the counter    nitrofurantoin 100 MG capsule Commonly known as:  MACRODANTIN Take 100 mg by mouth 2 (two) times daily.   senna-docusate 8.6-50 MG tablet Commonly known as:  Senokot-S Take 1 tablet by mouth 2 (two) times daily. Notes to patient:  Is over the counter      Follow-up Information    Meredith Staggers, MD Follow up.   Specialty:  Physical Medicine and Rehabilitation Why:  Office will call you with follow up appointment Contact information: [place] [address] [phone]        Dennie Bible, NP Follow up in 1 day(s).   Specialty:  Family Medicine Why:  for follow up appointment in 4 weeks.  Contact information: [address] [place] [address] [phone]        Idelle Crouch, MD. Go on 07/23/2017.   Specialty:  Internal Medicine Why:  @ 8:15 AM -  Call if you want to change this.  We just usually set it up for 1-2 weeks after hospitalization.  I know you already have an appointment scheduled for 08-03-17, so I'll let you decide what you want to do.   Contact information: [place] [address] [phone]           Signed: Bary Leriche 07/13/2017, 3:25 PM

## 2017-07-08 NOTE — Progress Notes (Signed)
Occupational Therapy Note  Patient Details  Name: Sara Robertson MRN: [id number] Date of Birth: [birth year]  Today's Date: 07/08/2017 OT Individual Time: 5681-2751 OT Individual Time Calculation (min): 38 min   Pt denies pain Individual Therapy  Pt sitting EOB upon arrival and requested to use toilet.  Pt amb with rollator to bathroom and completed toileting tasks at supervision level.  Pt completed washing her hands while standing at sink.  Pt amb with Rollator to Day room and engaged in [employer] tasks while standing on compliant and noncompliant surface.  Avg reaction time on noncompliant surface-1.99 seconds; avg reaction time on compliant surace-2.4 seconds with steady A while standing.  Pt transitioned to dynamic standing tasks using BUE.  Pt requires rest breaks after ~2 mins.  Pt returned to room and remained in bed with all needs within reach.    Leotis Shames [facility] 07/08/2017, 2:39 PM

## 2017-07-08 NOTE — Progress Notes (Signed)
[facility] PHYSICAL MEDICINE & REHABILITATION     PROGRESS NOTE    Subjective/Complaints: Feeling well.  Just returning from the bathroom.  ROS: pt denies nausea, vomiting, diarrhea, cough, shortness of breath or chest pain   Objective: Vital Signs: Blood pressure (!) 146/53, pulse 60, temperature 98.9 F (37.2 C), temperature source Oral, resp. rate 18, height 5\' 4"  (1.626 m), weight 88 kg (194 lb 0.1 oz), SpO2 98 %. No results found. Recent Labs    07/07/17 0513  WBC 8.1  HGB 10.9*  HCT 34.0*  PLT 275   Recent Labs    07/07/17 0513  NA 136  K 4.1  CL 102  GLUCOSE 104*  BUN 15  CREATININE 0.89  CALCIUM 8.7*   CBG (last 3)  No results for input(s): GLUCAP in the last 72 hours.  Wt Readings from Last 3 Encounters:  07/08/17 88 kg (194 lb 0.1 oz)  06/28/17 86.7 kg (191 lb 2.2 oz)  11/30/16 83.9 kg (185 lb)    Physical Exam:  Constitutional: She is oriented to person, place, and time. She appears well-developed and well-nourished. No distress.  HENT:  Head: Normocephalic and atraumatic.  Mouth/Throat: Oropharynx is clear and moist.  Eyes: Conjunctivae and EOM are normal. Pupils are equal, round, and reactive to light. Right eye exhibits no discharge. Left eye exhibits no discharge.  Neck: Normal range of motion. Neck supple.  Cardiovascular: Irregularly irregular without JVD or murmur   Respiratory: CTA Bilaterally without wheezes or rales. Normal effort  GI: Soft. Bowel sounds are normal. She exhibits no distension. There is no tenderness.  Musculoskeletal: She exhibits no edema or tenderness.  Neurological: She is alert and oriented to person, place, and time.  Reasonable insight and awareness HOH.  Speech slightly dysarthric. Strength remains grossly 4 out of 5 left upper and left lower extremity in 4 to 4+ out of 5 right upper and right lower extremity. Normal sensationrflexes are 1+and symmetrical Skin: Skin is warm and dry. No erythema.  Psychiatric:  Pleasant    Assessment/Plan: 1.  Functional and balance/gait deficits secondary to cerebellar infarct which require 3+ hours per day of interdisciplinary therapy in a comprehensive inpatient rehab setting. Physiatrist is providing close team supervision and 24 hour management of active medical problems listed below. Physiatrist and rehab team continue to assess barriers to discharge/monitor patient progress toward functional and medical goals.  Function:  Bathing Bathing position   Position: Shower  Bathing parts Body parts bathed by patient: Right arm, Left arm, Chest, Abdomen, Front perineal area, Right upper leg, Left upper leg, Right lower leg, Left lower leg Body parts bathed by helper: Buttocks  Bathing assist Assist Level: Touching or steadying assistance(Pt > 75%)      Upper Body Dressing/Undressing Upper body dressing   What is the patient wearing?: Pull over shirt/dress     Pull over shirt/dress - Perfomed by patient: Thread/unthread right sleeve, Thread/unthread left sleeve, Put head through opening, Pull shirt over trunk          Upper body assist Assist Level: Supervision or verbal cues      Lower Body Dressing/Undressing Lower body dressing   What is the patient wearing?: Underwear, Pants, Socks, Shoes Underwear - Performed by patient: Thread/unthread right underwear leg, Thread/unthread left underwear leg, Pull underwear up/down   Pants- Performed by patient: Thread/unthread right pants leg, Thread/unthread left pants leg, Pull pants up/down Pants- Performed by helper: Pull pants up/down Non-skid slipper socks- Performed by patient: Don/doff right  sock, Don/doff left sock   Socks - Performed by patient: Don/doff right sock, Don/doff left sock   Shoes - Performed by patient: Don/doff right shoe, Don/doff left shoe, Fasten right, Fasten left            Lower body assist Assist for lower body dressing: Supervision or verbal cues       Toileting Toileting Toileting activity did not occur: No continent bowel/bladder event Toileting steps completed by patient: Adjust clothing prior to toileting, Performs perineal hygiene, Adjust clothing after toileting Toileting steps completed by helper: Performs perineal hygiene Toileting Assistive Devices: Grab bar or rail  Toileting assist Assist level: Supervision or verbal cues   Transfers Chair/bed transfer Chair/bed transfer activity did not occur: Safety/medical concerns Chair/bed transfer method: Ambulatory Chair/bed transfer assist level: Supervision or verbal cues Chair/bed transfer assistive device: Armrests, Medical sales representative     Max distance: 175 Assist level: Supervision or verbal cues   Wheelchair   Type: Manual Max wheelchair distance: 60 Assist Level: Supervision or verbal cues  Cognition Comprehension Comprehension assist level: Follows basic conversation/direction with no assist  Expression Expression assist level: Expresses basic needs/ideas: With no assist  Social Interaction Social Interaction assist level: Interacts appropriately 75 - 89% of the time - Needs redirection for appropriate language or to initiate interaction.  Problem Solving Problem solving assist level: Solves basic problems with no assist  Memory Memory assist level: Recognizes or recalls 75 - 89% of the time/requires cueing 10 - 24% of the time   Medical Problem List and Plan: 1. Functional and balance deficits secondary to left cerebellar infarct -LUE and LLE ataxia and gait disorder.     -Making steady gains towards discharge goals.  We discussed her fatigue which is normal given the stroke and her age.  She is pleased with her progress 2. DVT Prophylaxis/Anticoagulation: Pharmaceutical: Lovenox 3. Pain Management: N/A 4. Mood: LCSW to follow for evaluation and support.  5. Neuropsych: This patient is capable of making decisions on her own behalf. 6.  Skin/Wound Care: Routine pressure relief measures 7. Fluids/Electrolytes/Nutrition: Encourage p.o. intake     -P.o. intake is fair.   -Addrf dietary supplements 8. HTN: Monitor BP bid --continue Lasix and Cozaar daily.  Blood pressure under control at present Vitals:   07/07/17 1300 07/08/17 0619  BP: 123/62 (!) 146/53  Pulse: 68 60  Resp: 16 18  Temp: 97.8 F (36.6 C) 98.9 F (37.2 C)  SpO2: 97% 98%  9. Frequent UTIs: On macrodantin bid--prophylactic                -Timed toileting to reduce incontinence has helped   -Fluid rationing as well to avoid increased output at night was discussed again 10. Anemia: on iron supplement.  Hemoglobin holding at 11.5 11. Dyslipidemia: On Lipitor. 12. RLS: Managed with tizanidine--continue to hold.  Monitor and resume if needed.     LOS (Days) 8 A FACE TO FACE EVALUATION WAS PERFORMED  Meredith Staggers, MD 07/08/2017 9:11 AM

## 2017-07-08 NOTE — Progress Notes (Signed)
Physical Therapy Session Note  Patient Details  Name: Sara Robertson MRN: [id number] Date of Birth: 1917-10-14  Today's Date: 07/08/2017 PT Individual Time: 0092-3300 PT Individual Time Calculation (min): 28 min   Short Term Goals: Week 1:  PT Short Term Goal 1 (Week 1): Pt will perform bed mobility with S from flat bed PT Short Term Goal 2 (Week 1): Pt will perform stand pivot transfers with S PT Short Term Goal 3 (Week 1): Pt will ambulate 150' with min guard PT Short Term Goal 4 (Week 1): Pt will ascend/descent four 6" steps with B handrails and minA for strengthening  Skilled Therapeutic Interventions/Progress Updates:    Pt supine in bed upon PT arrival, agreeable to therapy tx and denies bed. Pt transferred from supine>sitting EOB with supervision. While seated EOB pt donned shoes with supervision. Pt ambulated within the unit this session x 100 ft, x 50 ft, x 85 ft and x 100 ft with rollator and supervision, verbal cues for L foot clearance as pt became fatigued. Pt worked on dynamic standing balance within parallel bars to perform tandem walking forwards/backwards, sidestepping and backwards ambulation. Pt ascended/descended six 3 inch steps using B handrails and reciprocal pattern for LE strengthening. Pt left seated EOB with dinner in place at end of session, needs in reach and bed alarm set.   Therapy Documentation Precautions:  Precautions Precautions: Fall Restrictions Weight Bearing Restrictions: No   See Function Navigator for Current Functional Status.   Therapy/Group: Individual Therapy  Netta Corrigan, PT, DPT 07/08/2017, 4:56 PM

## 2017-07-08 NOTE — Progress Notes (Signed)
Occupational Therapy Session Note  Patient Details  Name: Sara Robertson MRN: [id number] Date of Birth: 1917/09/12  Today's Date: 07/08/2017 OT Individual Time: 0900-1000 OT Individual Time Calculation (min): 60 min    Short Term Goals: Week 1:  OT Short Term Goal 1 (Week 1): Pt will complete shower transfer with MinGuard assist. OT Short Term Goal 2 (Week 1): Pt will complete 3/3 steps of LB dressing with MinGuard assist.  OT Short Term Goal 3 (Week 1): Pt will complete standing grooming ADLs with MinGuard assist for dynamic balance.   Skilled Therapeutic Interventions/Progress Updates:    Pt resting in bed upon arrival and agreeable to bathing at shower level.  Pt amb with Rollator in room to gather clothing prior to amb into bathroom for shower.  Pt completed shower with sit<>stand at supervision level.  Pt completed dressing tasks with sit<>stand from chair.  Pt required more than a reasonable amount of time with multiple rest breaks.  Pt stated "this takes longer than usual." Pt required longer than usual rest break before engaging in functional amb tasks with multiple rest breaks sitting on Rollator.  Pt required min verbal cues for safety awareness with Rollator.  Pt returned to room and amb to recliner.  Pt remained in recliner with all needs within reach.   Therapy Documentation Precautions:  Precautions Precautions: Fall Restrictions Weight Bearing Restrictions: No Pain:    See Function Navigator for Current Functional Status.   Therapy/Group: Individual Therapy  Leroy Libman 07/08/2017, 12:44 PM

## 2017-07-09 ENCOUNTER — Inpatient Hospital Stay (HOSPITAL_COMMUNITY): Payer: Medicare Other | Admitting: Physical Therapy

## 2017-07-09 ENCOUNTER — Inpatient Hospital Stay (HOSPITAL_COMMUNITY): Payer: Medicare Other

## 2017-07-09 ENCOUNTER — Inpatient Hospital Stay (HOSPITAL_COMMUNITY): Payer: Medicare Other | Admitting: Speech Pathology

## 2017-07-09 MED ORDER — SENNOSIDES-DOCUSATE SODIUM 8.6-50 MG PO TABS: 1.0000 | ORAL_TABLET | Freq: Two times a day (BID) | ORAL | Status: AC

## 2017-07-09 NOTE — Evaluation (Signed)
Speech Language Pathology Assessment and Plan  Patient Details  Name: Sara Robertson MRN: [id number] Date of Birth: February 26, 1918  Today's Date: 07/09/2017 SLP Individual Time: 1300-1330 SLP Individual Time Calculation (min): 30 min   Problem List:  Patient Active Problem List   Diagnosis Date Noted  . Stroke (cerebrum) ([place]) 06/30/2017  . Cerebellar infarct ([place])   . Acute lower UTI   . PAF (paroxysmal atrial fibrillation) ([place])   . PAH (pulmonary artery hypertension) ([place])   . Benign essential HTN   . Diastolic dysfunction   . OSA (obstructive sleep apnea)   . Coronary artery disease involving native coronary artery of native heart without angina pectoris   . Acute blood loss anemia   . Acute CVA (cerebrovascular accident) ([place]) 06/28/2017  . Iron deficiency anemia 06/29/2015   Past Medical History:  Past Medical History:  Diagnosis Date  . Anemia   . Angina pectoris ([place])   . CAD (coronary artery disease)   . Diverticulosis   . GERD (gastroesophageal reflux disease)   . H/O carcinoid syndrome   . Headache   . History of tinnitus   . Hyperlipemia   . Hypertension   . OSA (obstructive sleep apnea)   . Peripheral neuropathy   . Pulmonary HTN ([place])    with noctural hypoxia?  . SOB (shortness of breath)    Past Surgical History:  Past Surgical History:  Procedure Laterality Date  . APPENDECTOMY    . BLADDER SUSPENSION    . CARDIAC CATHETERIZATION  2010  . CARPAL TUNNEL RELEASE Bilateral   . CHOLECYSTECTOMY    . LUMBAR LAMINECTOMY    . TONSILLECTOMY    . VAGINAL HYSTERECTOMY      Assessment / Plan / Recommendation Clinical Impression  Sara Robertson is a 83 y.o. female with history of HTN, OSA, CAD, SOB, recent UTI who was admitted on 06/28/2017 after found with right sided weakness and facial droop. History taken from chart review and patient. Weakness resolved enroute to hospital but patient noted to be dysarthric.  CT negative for acute process.  CTA  head/neck showed no large vessel occlusion, no basilar stenosis or dissection and evidence of pathologic mediastinal adenopathy.  MRI brain reviewed, showing multifocal left cerebellar CVA.  Per report, multiple acute small left cerebellar/SCA territory infarcts.  Patient with ataxic dysarthria, balance deficits and dizziness affecting mobility.  She lives in an  [place] and was independent with walker PTA. She was able to shower with assistance. She walks to the dinning room with a couple rest breaks due to SOB. Family is planning to hire supervision after discharge. CIR recommended due to functional deficits SLP evaluation completed 07/09/2017 with the following results:  Pt presents with grossly intact cognitive-linguistic function.  Pt's speech is returned to baseline per pt's reports.  Pt's speech was fluent and free from word finding impairment.  As a result, no further ST needs are indicated at this time.    Skilled Therapeutic Interventions          Cognitive-linguistic evaluation completed with results and recommendations reviewed with patient.     SLP Assessment  Patient does not need any further Speech Lanaguage Pathology Services    Recommendations  Follow up Recommendations: None           Pain Pain Assessment Pain Assessment: No/denies pain  Prior Functioning Cognitive/Linguistic Baseline: Within functional limits Type of Home: Independent living facility  Lives With: Alone  Function:  Eating Eating  Cognition Comprehension Comprehension assist level: Follows complex conversation/direction with extra time/assistive device  Expression   Expression assist level: Expresses complex ideas: With extra time/assistive device  Social Interaction Social Interaction assist level: Interacts appropriately with others - No medications needed.  Problem Solving Problem solving assist level: Solves basic problems with no assist  Memory Memory  assist level: Recognizes or recalls 90% of the time/requires cueing < 10% of the time     Refer to Care Plan for Long Term Goals  Recommendations for other services: None   Discharge Criteria: Patient will be discharged from SLP if patient refuses treatment 3 consecutive times without medical reason, if treatment goals not met, if there is a change in medical status, if patient makes no progress towards goals or if patient is discharged from hospital.  The above assessment, treatment plan, treatment alternatives and goals were discussed and mutually agreed upon: by patient  Emilio Math 07/09/2017, 4:09 PM

## 2017-07-09 NOTE — Progress Notes (Signed)
[facility] PHYSICAL MEDICINE & REHABILITATION     PROGRESS NOTE    Subjective/Complaints: Rested well again.  No complaints this morning  ROS: pt denies nausea, vomiting, diarrhea, cough, shortness of breath or chest pain   Objective: Vital Signs: Blood pressure (!) 130/51, pulse 70, temperature 98.9 F (37.2 C), temperature source Oral, resp. rate 18, height 5\' 4"  (1.626 m), weight 88 kg (194 lb 0.1 oz), SpO2 98 %. No results found. Recent Labs    07/07/17 0513  WBC 8.1  HGB 10.9*  HCT 34.0*  PLT 275   Recent Labs    07/07/17 0513  NA 136  K 4.1  CL 102  GLUCOSE 104*  BUN 15  CREATININE 0.89  CALCIUM 8.7*   CBG (last 3)  No results for input(s): GLUCAP in the last 72 hours.  Wt Readings from Last 3 Encounters:  07/08/17 88 kg (194 lb 0.1 oz)  06/28/17 86.7 kg (191 lb 2.2 oz)  11/30/16 83.9 kg (185 lb)    Physical Exam:  Constitutional: She is oriented to person, place, and time. She appears well-developed and well-nourished. No distress.  HENT:  Head: Normocephalic and atraumatic.  Mouth/Throat: Oropharynx is clear and moist.  Eyes: Conjunctivae and EOM are normal. Pupils are equal, round, and reactive to light. Right eye exhibits no discharge. Left eye exhibits no discharge.  Neck: Normal range of motion. Neck supple.  Cardiovascular: Irregularly irregular without JVD or murmur    Respiratory: CTA Bilaterally without wheezes or rales. Normal effort  GI: Soft. Bowel sounds are normal. She exhibits no distension. There is no tenderness.  Musculoskeletal: She exhibits no edema or tenderness.  Neurological: She is alert and oriented to person, place, and time.  Reasonable insight and awareness HOH.  Speech slightly dysarthric. Strength remains grossly 4 out of 5 left upper and left lower extremity in 4 to 4+ out of 5 right upper and right lower extremity. Normal sensationrflexes are 1+and symmetrical Skin: Skin is warm and dry. No erythema.  Psychiatric:  Pleasant    Assessment/Plan: 1.  Functional and balance/gait deficits secondary to cerebellar infarct which require 3+ hours per day of interdisciplinary therapy in a comprehensive inpatient rehab setting. Physiatrist is providing close team supervision and 24 hour management of active medical problems listed below. Physiatrist and rehab team continue to assess barriers to discharge/monitor patient progress toward functional and medical goals.  Function:  Bathing Bathing position   Position: Shower  Bathing parts Body parts bathed by patient: Right arm, Left arm, Chest, Abdomen, Front perineal area, Right upper leg, Left upper leg, Right lower leg, Left lower leg, Back Body parts bathed by helper: Buttocks  Bathing assist Assist Level: Supervision or verbal cues      Upper Body Dressing/Undressing Upper body dressing   What is the patient wearing?: Pull over shirt/dress     Pull over shirt/dress - Perfomed by patient: Thread/unthread right sleeve, Thread/unthread left sleeve, Put head through opening, Pull shirt over trunk          Upper body assist Assist Level: Supervision or verbal cues      Lower Body Dressing/Undressing Lower body dressing   What is the patient wearing?: Underwear, Pants, Socks, Shoes Underwear - Performed by patient: Thread/unthread right underwear leg, Thread/unthread left underwear leg, Pull underwear up/down   Pants- Performed by patient: Thread/unthread right pants leg, Thread/unthread left pants leg, Pull pants up/down Pants- Performed by helper: Pull pants up/down Non-skid slipper socks- Performed by patient: Don/doff right  sock, Don/doff left sock   Socks - Performed by patient: Don/doff right sock, Don/doff left sock   Shoes - Performed by patient: Don/doff right shoe, Don/doff left shoe, Fasten right, Fasten left            Lower body assist Assist for lower body dressing: Supervision or verbal cues      Toileting Toileting  Toileting activity did not occur: No continent bowel/bladder event Toileting steps completed by patient: Adjust clothing prior to toileting, Performs perineal hygiene, Adjust clothing after toileting Toileting steps completed by helper: Performs perineal hygiene Toileting Assistive Devices: Grab bar or rail  Toileting assist Assist level: Supervision or verbal cues   Transfers Chair/bed transfer Chair/bed transfer activity did not occur: Safety/medical concerns Chair/bed transfer method: Ambulatory Chair/bed transfer assist level: Supervision or verbal cues Chair/bed transfer assistive device: Armrests, Medical sales representative     Max distance: 100 ft Assist level: Supervision or verbal cues   Wheelchair   Type: Manual Max wheelchair distance: 60 Assist Level: Supervision or verbal cues  Cognition Comprehension Comprehension assist level: Follows basic conversation/direction with no assist  Expression Expression assist level: Expresses basic needs/ideas: With no assist  Social Interaction Social Interaction assist level: Interacts appropriately 75 - 89% of the time - Needs redirection for appropriate language or to initiate interaction.  Problem Solving Problem solving assist level: Solves basic problems with no assist  Memory Memory assist level: Recognizes or recalls 75 - 89% of the time/requires cueing 10 - 24% of the time   Medical Problem List and Plan: 1. Functional and balance deficits secondary to left cerebellar infarct -LUE and LLE ataxia and gait disorder.     -On track for discharge 12/15   -Patient to see Rehab MD in the office for transitional care encounter in 1-2 weeks.  2. DVT Prophylaxis/Anticoagulation: Pharmaceutical: Lovenox 3. Pain Management: N/A 4. Mood: LCSW to follow for evaluation and support.  5. Neuropsych: This patient is capable of making decisions on her own behalf. 6. Skin/Wound Care: Routine pressure relief measures 7.  Fluids/Electrolytes/Nutrition: Encourage p.o. intake     -P.o. intake is fair.   -Addrf dietary supplements 8. HTN: Monitor BP bid --continue Lasix and Cozaar daily.  Blood pressure under control at present Vitals:   07/09/17 0443 07/09/17 0829  BP: 122/74 (!) 130/51  Pulse: 68 70  Resp: 18   Temp: 98.9 F (37.2 C)   SpO2: 98%   9. Frequent UTIs: On macrodantin bid--prophylactic                -Timed toileting to reduce incontinence has been effective   -Fluid rationing as well to avoid increased output at night was discussed again 10. Anemia: on iron supplement.  Hemoglobin holding at 11.5 11. Dyslipidemia: On Lipitor. 12. RLS: Managed with tizanidine--continue to hold.  Monitor and resume if needed.     LOS (Days) 9 A FACE TO FACE EVALUATION WAS PERFORMED  Meredith Staggers, MD 07/09/2017 9:16 AM

## 2017-07-09 NOTE — Progress Notes (Signed)
Social Work Patient ID: Sara Robertson, female   DOB: 06/19/1918, 83 y.o.   MRN: [id number]   CSW met with pt on 07-07-17 to update her on team conference discussion and targeted d/c date of 07-11-17.  Pt was pleased with that and has worked really hard and is excited to be going home.  CSW then spoke with both [name] and [name], two of pt's dtrs, to update them individually via telephone on 07-08-17.  They were pleased to hear of pt's d/c and knew she would be happy.  [name] is arranging for someone to be with pt 24/7 initially.  [name] had sent CSW an email for the team with her concerns about pt's fatigue level, her speech, etc and CSW shared this with the team and discussed with [name] team's responses, including that pt is capable of everything she is doing in therapy and that her level of fatigue is normal and not anything that Dr. Naaman Plummer or therapists find concerning at this time.  CSW also called [facility] to find out their process for arranging therapies there, as they have their own in house rehab team.  CSW will send them MD order for therapies.  Pt does not need any DME, as she has what is recommended already at home.  CSW will continue to follow and assist as needed.

## 2017-07-09 NOTE — Progress Notes (Signed)
Occupational Therapy Session Note  Patient Details  Name: Sara Robertson MRN: [id number] Date of Birth: 1918/07/24  Today's Date: 07/09/2017 OT Individual Time: 0900-1000 OT Individual Time Calculation (min): 60 min    Short Term Goals: Week 1:  OT Short Term Goal 1 (Week 1): Pt will complete shower transfer with MinGuard assist. OT Short Term Goal 2 (Week 1): Pt will complete 3/3 steps of LB dressing with MinGuard assist.  OT Short Term Goal 3 (Week 1): Pt will complete standing grooming ADLs with MinGuard assist for dynamic balance.   Skilled Therapeutic Interventions/Progress Updates:    Pt resting in bed upon arrival and agreeable to participating in therapy.  Pt declined shower this morning but wanted to don clean clothing.  Pt amb with Rollator to bathroom to use toilet prior to selecting clothing.  Pt completed dressing tasks with sit<>stand from EOB at supervision level.  Pt amb to therapy gym and engaged in dynamic standing activities including retrieving items from floor.  Pt stated, "that was exhausting", after completing task.  Pt amb with Rollator back to room and returned to recliner.  Pt remained in recliner with all needs within reach.   Therapy Documentation Precautions:  Precautions Precautions: Fall Restrictions Weight Bearing Restrictions: No  Pain: Pain Assessment Pain Assessment: 0-10 Pain Score: 1  Pain Type: Chronic pain Pain Location: Shoulder Pain Orientation: Left Pain Descriptors / Indicators: Aching Pain Frequency: Occasional Pain Onset: Gradual Pain Intervention(s): Repositioned(patient requested no meds, "it's all right")  See Function Navigator for Current Functional Status.   Therapy/Group: Individual Therapy  Leroy Libman 07/09/2017, 10:03 AM

## 2017-07-09 NOTE — Progress Notes (Signed)
Physical Therapy Session Note  Patient Details  Name: Sara Robertson MRN: [id number] Date of Birth: 04-29-1918  Today's Date: 07/09/2017 PT Individual Time: 1100-1130 and 1500-1600 PT Individual Time Calculation (min): 30 min and 60 min (total 90 min)  Short Term Goals: Week 1:  PT Short Term Goal 1 (Week 1): Pt will perform bed mobility with S from flat bed PT Short Term Goal 2 (Week 1): Pt will perform stand pivot transfers with S PT Short Term Goal 3 (Week 1): Pt will ambulate 150' with min guard PT Short Term Goal 4 (Week 1): Pt will ascend/descent four 6" steps with B handrails and minA for strengthening  Skilled Therapeutic Interventions/Progress Updates: Tx 1: Pt received seated in recliner, denies pain and agreeable to treatment. Gait in/out of bathroom with rollator and S; manages clothing and performs hygiene with S using grab bar as necessary. Instructed pt in [place] A exercises at sink with min cues for technique, repetitions/sets. Pt requires seated rest break between all exercises d/t fatigue. Ambulated back to recliner with rollator and S. Remained seated in recliner, all needs in reach.   Tx 2: Pt received supine in bed, denies pain and agreeable to treatment. Bed mobility modI. Seated on EOB pt doffs non-skid socks, dons regular socks and therapist dons shoes for energy conservation d/t fatigue. Stand pivot transfer to w/c with close S; poor eccentric control. Transported to gym totalA for energy conservation. Performed ascent/descent four 6" steps with B handrails and min guard, step-to pattern and slow speed. BUE bead threading task for focus on BUE coordination. Standing balance on wedge while performing overhead RUE reaching while playing connect four game for focus on ankle strategy, ankle ROM, cognitive dual task; several posterior LOBs requiring mod cues to recover. Supine bridging and straight leg raises 2x10 reps each BLE. Standing heel/toe raises 2x15. Stand pivot mat  table>w/c>bed with S. Sit >supine modI. Remained supine in bed at end of session, all needs in reach.      Therapy Documentation Precautions:  Precautions Precautions: Fall Restrictions Weight Bearing Restrictions: No   See Function Navigator for Current Functional Status.   Therapy/Group: Individual Therapy  Luberta Mutter 07/09/2017, 3:15 PM

## 2017-07-10 ENCOUNTER — Inpatient Hospital Stay (HOSPITAL_COMMUNITY): Payer: Medicare Other | Admitting: Physical Therapy

## 2017-07-10 ENCOUNTER — Inpatient Hospital Stay (HOSPITAL_COMMUNITY): Payer: Medicare Other

## 2017-07-10 MED ORDER — FUROSEMIDE 40 MG PO TABS: 20.0000 mg | ORAL_TABLET | Freq: Every day | ORAL | 0 refills | Status: AC

## 2017-07-10 MED ORDER — FERROUS SULFATE 325 (65 FE) MG PO TABS: 325.0000 mg | ORAL_TABLET | Freq: Every day | ORAL | 0 refills | Status: AC

## 2017-07-10 MED ORDER — LOSARTAN POTASSIUM 50 MG PO TABS: 50.0000 mg | ORAL_TABLET | Freq: Every day | ORAL | 0 refills | Status: AC

## 2017-07-10 MED ORDER — NITROFURANTOIN MONOHYD MACRO 100 MG PO CAPS: 100.0000 mg | ORAL_CAPSULE | Freq: Two times a day (BID) | ORAL | 0 refills | Status: DC

## 2017-07-10 MED ORDER — ATORVASTATIN CALCIUM 20 MG PO TABS: 20.0000 mg | ORAL_TABLET | Freq: Every day | ORAL | 0 refills | Status: AC

## 2017-07-10 NOTE — Progress Notes (Signed)
Occupational Therapy Session Note  Patient Details  Name: Sara Robertson MRN: [id number] Date of Birth: [birth year]  Today's Date: 07/10/2017 OT Individual Time: 1314-3888 OT Individual Time Calculation (min): 57 min    Short Term Goals: Week 1:  OT Short Term Goal 1 (Week 1): Pt will complete shower transfer with MinGuard assist. OT Short Term Goal 2 (Week 1): Pt will complete 3/3 steps of LB dressing with MinGuard assist.  OT Short Term Goal 3 (Week 1): Pt will complete standing grooming ADLs with MinGuard assist for dynamic balance.   Skilled Therapeutic Interventions/Progress Updates:    Pt sitting EOB upon arrival.  Pt amb with Rollator in room to gather clothing prior to entering bathroom to use toilet.  Pt transferred to tub transfer bench after toileting and completed bathing tasks with sit<>stand.  Pt returned to room and completed dressing tasks with sit<>stand from chair.  Pt completed all tasks at supervision level with multiple rest breaks.  Pt requires more than a reasonable amount of time to complete tasks.  Pt states that she usually rests after taking a shower because of how exhausting the tasks are.  Pt amb with Rollator to recliner and remained seated with all needs within reach.   Therapy Documentation Precautions:  Precautions Precautions: Fall Restrictions Weight Bearing Restrictions: No   Pain:  Pt denies pain  See Function Navigator for Current Functional Status.   Therapy/Group: Individual Therapy  Leroy Libman 07/10/2017, 9:58 AM

## 2017-07-10 NOTE — Discharge Summary (Signed)
Occupational Therapy Discharge Summary  Patient Details  Name: Sara Robertson MRN: [id number] Date of Birth: [birth year]  Patient has met 10 of 10 long term goals due to improved activity tolerance, improved balance, postural control, ability to compensate for deficits, improved awareness and improved coordination.  Pt made steady progress with BADLs during this admission.  Pt completes bathing, dressing, toileting, and functional transfers at supervision level.  Pt requires multiple rest breaks during all functional tasks.  Pt's family has not been present for therapy but has arranged (at [facility]) for 24 hour supervision. Patient to discharge at overall Supervision level.  Patient's ALF will provide the necessary physical and cognitive assistance at discharge.     Recommendation:  Patient will benefit from ongoing skilled OT services in home health setting to continue to advance functional skills in the area of BADL and iADL.  Equipment: No equipment provided Pt owns shower seat  Reasons for discharge: treatment goals met  Patient/family agrees with progress made and goals achieved: Yes  OT Discharge   ADL ADL ADL Comments: Please see functional navigator  Vision Baseline Vision/History: Wears glasses Wears Glasses: At all times Patient Visual Report: No change from baseline Vision Assessment?: No apparent visual deficits Eye Alignment: Within Functional Limits Ocular Range of Motion: Within Functional Limits Perception  Perception: Within Functional Limits Praxis Praxis: Intact Cognition Overall Cognitive Status: Within Functional Limits for tasks assessed Arousal/Alertness: Awake/alert Orientation Level: Oriented X4 Attention: Sustained Sustained Attention: Appears intact Memory: Impaired Memory Impairment: Decreased recall of new information;Decreased short term memory Awareness Impairment: Emergent impairment Problem Solving:  Impaired Safety/Judgment: Appears intact Sensation Sensation Light Touch: Appears Intact Stereognosis: Appears Intact Hot/Cold: Appears Intact Proprioception: Appears Intact Motor  Motor Motor: Ataxia Motor - Skilled Clinical Observations: ataxic movements noted in LUE/LLE     Trunk/Postural Assessment  Cervical Assessment Cervical Assessment: Within Functional Limits Thoracic Assessment Thoracic Assessment: Exceptions to [facility] Lumbar Assessment Lumbar Assessment: Exceptions to [facility] Postural Control Righting Reactions: delayed righting reactions   Balance Static Sitting Balance Static Sitting - Balance Support: Feet unsupported Static Sitting - Level of Assistance: 6: Modified independent (Device/Increase time) Dynamic Sitting Balance Dynamic Sitting - Balance Support: Feet supported Dynamic Sitting - Level of Assistance: 6: Modified independent (Device/Increase time) Extremity/Trunk Assessment RUE Assessment RUE Assessment: Within Functional Limits LUE Assessment LUE Assessment: Within Functional Limits   See Function Navigator for Current Functional Status.  Leotis Shames [facility] 07/10/2017, 2:43 PM

## 2017-07-10 NOTE — Progress Notes (Signed)
Occupational Therapy Note  Patient Details  Name: Sara Robertson MRN: [id number] Date of Birth: 1918-02-12  Today's Date: 07/10/2017 OT Individual Time: 1499-6924 OT Individual Time Calculation (min): 25 min   Pt denies pain Individual Therapy  Pt resting in bed upon arrival.  Pt amb in room to access bathroom and complete toileting tasks at supervision level.  Continued ongoing discharge planning, home safety, energy conservation, and safety awareness.  Pt pleased with progress and ready for discharge to [facility]isted Living [facility].  Pt returned to bed and remained in bed with all needs within reach.    Leotis Shames [facility] 07/10/2017, 2:28 PM

## 2017-07-10 NOTE — Progress Notes (Signed)
Physical Therapy Discharge Summary  Patient Details  Name: SHANTERRIA FRANTA MRN: [id number] Date of Birth: 1918/07/22  Today's Date: 07/10/2017 PT Individual Time: 1100-1200 PT Individual Time Calculation (min): 60 min    Patient has met 9 of 9 long term goals due to improved activity tolerance, improved balance, improved postural control, increased strength, ability to compensate for deficits, functional use of  left upper extremity and left lower extremity and improved coordination.  Patient to discharge at an ambulatory level Supervision.   Patient's care partner is independent to provide the necessary physical and cognitive assistance at discharge.  Reasons goals not met: All goals met  Recommendation:  Patient will benefit from ongoing skilled PT services in home health setting to continue to advance safe functional mobility, address ongoing impairments in balance, strength, ROM, coordination, activity tolerance, and minimize fall risk.  Equipment: No equipment provided  Reasons for discharge: treatment goals met  Patient/family agrees with progress made and goals achieved: Yes  PT Discharge Precautions/Restrictions Restrictions Weight Bearing Restrictions: No Vital Signs Therapy Vitals Temp: 98.8 F (37.1 C) Temp Source: Oral Pulse Rate: 63 Resp: 16 BP: (!) 126/58 Patient Position (if appropriate): Lying Oxygen Therapy SpO2: 98 % O2 Device: Not Delivered Pain Pain Assessment Pain Assessment: No/denies pain Vision/Perception  Perception Perception: Within Functional Limits Praxis Praxis: Intact  Cognition Overall Cognitive Status: Within Functional Limits for tasks assessed Arousal/Alertness: Awake/alert Orientation Level: Oriented X4 Attention: Sustained Sustained Attention: Appears intact Memory: Impaired Memory Impairment: Decreased recall of new information;Decreased short term memory Awareness: Appears intact Problem Solving:  Impaired Safety/Judgment: Appears intact Sensation Sensation Light Touch: Appears Intact Stereognosis: Appears Intact Hot/Cold: Appears Intact Proprioception: Appears Intact Motor  Motor Motor: Ataxia Motor - Skilled Clinical Observations: ataxic movements noted in LUE/LLE  Mobility Bed Mobility Bed Mobility: Supine to Sit;Sit to Supine Supine to Sit: 6: Modified independent (Device/Increase time) Sit to Supine: 6: Modified independent (Device/Increase time) Transfers Transfers: Yes Sit to Stand: 6: Modified independent (Device/Increase time) Stand to Sit: 6: Modified independent (Device/Increase time) Stand Pivot Transfers: 5: Supervision;With armrests Stand Pivot Transfer Details: Verbal cues for technique;Verbal cues for precautions/safety Stand Pivot Transfer Details (indicate cue type and reason): cues for slow eccentric control Locomotion  Ambulation Ambulation: Yes Ambulation/Gait Assistance: 5: Supervision Ambulation Distance (Feet): 175 Feet Assistive device: Rollator Ambulation/Gait Assistance Details: Verbal cues for precautions/safety;Verbal cues for safe use of DME/AE Gait Gait: Yes Gait Pattern: Impaired Gait Pattern: Ataxic;Poor foot clearance - left;Poor foot clearance - right;Narrow base of support;Lateral hip instability Gait velocity: 2.1 ft/sec Stairs / Additional Locomotion Stairs: Yes Stairs Assistance: 5: Supervision Stairs Assistance Details: Verbal cues for precautions/safety;Verbal cues for technique Stair Management Technique: Two rails;Forwards;Step to pattern Number of Stairs: 12 Height of Stairs: 6 Ramp: 5: Supervision Curb: 5: Psychiatric nurse: Yes Wheelchair Assistance: 5: Investment banker, operational Details: Verbal cues for Information systems manager: Both upper extremities Wheelchair Parts Management: Needs assistance Distance: 23' for strengthening  Trunk/Postural Assessment   Cervical Assessment Cervical Assessment: Within Functional Limits Thoracic Assessment Thoracic Assessment: Exceptions to [facility] Lumbar Assessment Lumbar Assessment: Exceptions to [facility] Postural Control Righting Reactions: delayed righting reactions   Balance Static Sitting Balance Static Sitting - Balance Support: Feet unsupported Static Sitting - Level of Assistance: 6: Modified independent (Device/Increase time) Dynamic Sitting Balance Dynamic Sitting - Balance Support: Feet supported Dynamic Sitting - Level of Assistance: 6: Modified independent (Device/Increase time) Extremity Assessment  RUE Assessment RUE Assessment: Within Functional Limits LUE Assessment LUE Assessment: Within  Functional Limits RLE Assessment RLE Assessment: Within Functional Limits LLE Assessment LLE Assessment: Within Functional Limits   Skilled Therapeutic Intervention: Tx 1: Pt received seated in recliner, denies pain and agreeable to treatment. Pt transported to gym via w/c totalA for energy conservation. Propelled with BUE x75' for strengthening and aerobic endurance with supervision. Assessed stairs, gait, bed and car transfers with S overall using rollator as above. TUG score as above indicating high risk for falls; educated pt on risk for falls, recommendation for 24/7 S and use of rollator for safety; pt verbalizes understanding. Returned to bed at end of session, all needs in reach and alarm intact.   Tx 2: Pt received seated in recliner, denies pain and agreeable to treatment. Pt dons shoes with setupA. Ambulatory transfer to w/c with rollator and S. Transported totalA in w/c for energy conservation. Engaged in dynavision for focus on BUE coordination, visual scanning, dynamic standing balance. Demonstrates increased average reaction time with repetition, and mild difference in speed from R to L, likely d/t LUE incoordination and ataxia. Returned to room totalA; returned to bed modI stand pivot and  sit>supine. Pt with no further questions/concerns regarding d/c at this time. All needs in reach.   See Function Navigator for Current Functional Status.  Benjiman Core Tygielski 07/10/2017, 12:13 PM

## 2017-07-10 NOTE — Progress Notes (Signed)
[facility]ICINE & REHABILITATION     PROGRESS NOTE    Subjective/Complaints: Up eating breakfast.  No new complaints today.  Feels that she is ready to go home and excited about tomorrow  ROS: pt denies nausea, vomiting, diarrhea, cough, shortness of breath or chest pain   Objective: Vital Signs: Blood pressure 112/75, pulse 74, temperature 97.6 F (36.4 C), temperature source Oral, resp. rate 18, height 5\' 4"  (1.626 m), weight 88 kg (194 lb 0.1 oz), SpO2 97 %. No results found. No results for input(s): WBC, HGB, HCT, PLT in the last 72 hours. No results for input(s): NA, K, CL, GLUCOSE, BUN, CREATININE, CALCIUM in the last 72 hours.  Invalid input(s): CO CBG (last 3)  No results for input(s): GLUCAP in the last 72 hours.  Wt Readings from Last 3 Encounters:  07/08/17 88 kg (194 lb 0.1 oz)  06/28/17 86.7 kg (191 lb 2.2 oz)  11/30/16 83.9 kg (185 lb)    Physical Exam:  Constitutional: She is oriented to person, place, and time. She appears well-developed and well-nourished. No distress.  HENT:  Head: Normocephalic and atraumatic.  Mouth/Throat: Oropharynx is clear and moist.  Eyes: Conjunctivae and EOM are normal. Pupils are equal, round, and reactive to light. Right eye exhibits no discharge. Left eye exhibits no discharge.  Neck: Normal range of motion. Neck supple.  Cardiovascular: Irregularly irregular without JVD or murmur     Respiratory: CTA Bilaterally without wheezes or rales. Normal effort  GI: Soft. Bowel sounds are normal. She exhibits no distension. There is no tenderness.  Musculoskeletal: She exhibits no edema or tenderness.  Neurological: She is alert and oriented to person, place, and time.  Reasonable insight and awareness HOH.  Speech slightly dysarthric. Strength remains grossly 4 out of 5 left upper and left lower extremity in 4 to 4+ out of 5 right upper and right lower extremity. Normal sensationrflexes are 1+and symmetrical Skin: Skin  is warm and dry. No erythema.  Psychiatric: Pleasant    Assessment/Plan: 1.  Functional and balance/gait deficits secondary to cerebellar infarct which require 3+ hours per day of interdisciplinary therapy in a comprehensive inpatient rehab setting. Physiatrist is providing close team supervision and 24 hour management of active medical problems listed below. Physiatrist and rehab team continue to assess barriers to discharge/monitor patient progress toward functional and medical goals.  Function:  Bathing Bathing position   Position: Shower  Bathing parts Body parts bathed by patient: Right arm, Left arm, Chest, Abdomen, Front perineal area, Right upper leg, Left upper leg, Right lower leg, Left lower leg, Back Body parts bathed by helper: Buttocks  Bathing assist Assist Level: Supervision or verbal cues      Upper Body Dressing/Undressing Upper body dressing   What is the patient wearing?: Pull over shirt/dress     Pull over shirt/dress - Perfomed by patient: Thread/unthread right sleeve, Thread/unthread left sleeve, Put head through opening, Pull shirt over trunk          Upper body assist Assist Level: Supervision or verbal cues      Lower Body Dressing/Undressing Lower body dressing   What is the patient wearing?: Underwear, Pants, Socks, Shoes Underwear - Performed by patient: Thread/unthread right underwear leg, Thread/unthread left underwear leg, Pull underwear up/down   Pants- Performed by patient: Thread/unthread right pants leg, Thread/unthread left pants leg, Pull pants up/down Pants- Performed by helper: Pull pants up/down Non-skid slipper socks- Performed by patient: Don/doff right sock, Don/doff left sock  Socks - Performed by patient: Don/doff right sock, Don/doff left sock   Shoes - Performed by patient: Don/doff right shoe, Don/doff left shoe, Fasten right, Fasten left            Lower body assist Assist for lower body dressing: Supervision or  verbal cues      Toileting Toileting Toileting activity did not occur: No continent bowel/bladder event Toileting steps completed by patient: Adjust clothing prior to toileting, Performs perineal hygiene, Adjust clothing after toileting Toileting steps completed by helper: Performs perineal hygiene Toileting Assistive Devices: Grab bar or rail  Toileting assist Assist level: Supervision or verbal cues   Transfers Chair/bed transfer Chair/bed transfer activity did not occur: Safety/medical concerns Chair/bed transfer method: Ambulatory Chair/bed transfer assist level: Supervision or verbal cues Chair/bed transfer assistive device: Armrests     Locomotion Ambulation     Max distance: 100 ft Assist level: Supervision or verbal cues   Wheelchair   Type: Manual Max wheelchair distance: 60 Assist Level: Supervision or verbal cues  Cognition Comprehension Comprehension assist level: Follows complex conversation/direction with extra time/assistive device  Expression Expression assist level: Expresses complex ideas: With extra time/assistive device  Social Interaction Social Interaction assist level: Interacts appropriately with others - No medications needed.  Problem Solving Problem solving assist level: Solves basic problems with no assist  Memory Memory assist level: Recognizes or recalls 90% of the time/requires cueing < 10% of the time   Medical Problem List and Plan: 1. Functional and balance deficits secondary to left cerebellar infarct -LUE and LLE ataxia and gait disorder.     -ELOS 12/15   -Patient to see Rehab MD in the office for transitional care encounter in 1-2 weeks if possible.  2. DVT Prophylaxis/Anticoagulation: Pharmaceutical: Lovenox 3. Pain Management: N/A 4. Mood: LCSW to follow for evaluation and support.  5. Neuropsych: This patient is capable of making decisions on her own behalf. 6. Skin/Wound Care: Routine pressure relief measures 7.  Fluids/Electrolytes/Nutrition: Encourage p.o. intake     -P.o. intake is fair.   -Continue dietary supplements 8. HTN: Monitor BP bid --continue Lasix and Cozaar daily.  Blood pressure under control at present Vitals:   07/09/17 1500 07/10/17 0536  BP: (!) 140/43 112/75  Pulse: 70 74  Resp: 16 18  Temp: 98.6 F (37 C) 97.6 F (36.4 C)  SpO2: 98% 97%  9. Frequent UTIs: On macrodantin bid--prophylactic                -Timed toileting to reduce incontinence has been effective   -Fluid rationing as well to avoid increased output at night was discussed again 10. Anemia: on iron supplement.  Hemoglobin holding at 11.5 11. Dyslipidemia: On Lipitor. 12. RLS: Managed with tizanidine--continue to hold.  Monitor and resume if needed.     LOS (Days) 10 A Emerson T, MD 07/10/2017 9:39 AM

## 2017-07-10 NOTE — Progress Notes (Addendum)
Social Work Discharge Note  The overall goal for the admission was met for:   Discharge location: Yes - [facility]  Length of Stay: Yes - 11 days  Discharge activity level: Yes - 24/7 supervision  Home/community participation: Yes  Services provided included: MD, RD, PT, OT, SLP, RN, TR, Pharmacy and SW  Financial Services: Medicare and Private Insurance: T[employer] for Life  Follow-up services arranged: Home Health: PT/OT provided by [facility] in-house Rehab Team and Patient/Family has no preference for HH/DME agencies  Comments (or additional information):  Pt to return to her independent living apartment with 24/7 supervision and in-house PT/OT.  Family to transport her home.  Patient/Family verbalized understanding of follow-up arrangements: Yes  Individual responsible for coordination of the follow-up plan: pt and her family  Confirmed correct DME delivered: Trey Sailors 07/10/2017    Zakara Parkey, [name]

## 2017-07-10 NOTE — Discharge Instructions (Signed)
Inpatient Rehab Discharge Instructions  CHAVON LUCARELLI Discharge date and time:  07/11/17  Activities/Precautions/ Functional Status: Activity: no lifting, driving, or strenuous exercise till cleared by MD Diet: cardiac diet Wound Care: none needed    Functional status:  ___ No restrictions     ___ Walk up steps independently _X__ 24/7 supervision/assistance   ___ Walk up steps with assistance ___ Intermittent supervision/assistance  ___ Bathe/dress independently ___ Walk with walker     _X__ Bathe/dress with assistance ___ Walk Independently    ___ Shower independently ___ Walk with assistance    ___ Shower with assistance _X__ No alcohol     ___ Return to work/school ________    COMMUNITY REFERRALS UPON DISCHARGE:   Home Health:   PT     OT     Agency:  [facility] is setting up therapy for you with their [facility] faxed the doctor's order to them. Medical Equipment/Items Ordered:  You already have all recommended equipment at home.   GENERAL COMMUNITY RESOURCES FOR PATIENT/FAMILY: Support Groups:  [facility]                              Meets the 4th Tuesday of each month (not in December), 12:15 - 1:30 PM                              [facility] [address]                              For more information or to register, call [phone]   Special Instructions: 1. Drink plenty of fluids. 2. Note increase in Aspirin dose.    STROKE/TIA DISCHARGE INSTRUCTIONS SMOKING Cigarette smoking nearly doubles your risk of having a stroke & is the single most alterable risk factor  If you smoke or have smoked in the last 12 months, you are advised to quit smoking for your health.  Most of the excess cardiovascular risk related to smoking disappears within a year of stopping.  Ask you doctor about anti-smoking medications  [place] Quit Line: 1-800-QUIT NOW  Free  Smoking Cessation Classes [phone]  CHOLESTEROL Know your levels; limit fat & cholesterol in your diet  Lipid Panel     Component Value Date/Time   CHOL 179 06/29/2017 0435   TRIG 71 06/29/2017 0435   HDL 40 (L) 06/29/2017 0435   CHOLHDL 4.5 06/29/2017 0435   VLDL 14 06/29/2017 0435   LDLCALC 125 (H) 06/29/2017 0435      Many patients benefit from treatment even if their cholesterol is at goal.  Goal: Total Cholesterol (CHOL) less than 160  Goal:  Triglycerides (TRIG) less than 150  Goal:  HDL greater than 40  Goal:  LDL (LDLCALC) less than 100   BLOOD PRESSURE American Stroke Association blood pressure target is less that 120/80 mm/Hg  Your discharge blood pressure is:  BP: 129/70  Monitor your blood pressure  Limit your salt and alcohol intake  Many individuals will require more than one medication for high blood pressure  DIABETES (A1c is a blood sugar average for  last 3 months) Goal HGBA1c is under 7% (HBGA1c is blood sugar average for last 3 months)  Diabetes: No known diagnosis of diabetes    Lab Results  Component Value Date   HGBA1C 5.4 06/29/2017     Your HGBA1c can be lowered with medications, healthy diet, and exercise.  Check your blood sugar as directed by your physician  Call your physician if you experience unexplained or low blood sugars.  PHYSICAL ACTIVITY/REHABILITATION Goal is 30 minutes at least 4 days per week  Activity: Increase activity slowly, Therapies: see above  Return to work: N/A  Activity decreases your risk of heart attack and stroke and makes your heart stronger.  It helps control your weight and blood pressure; helps you relax and can improve your mood.  Participate in a regular exercise program.  Talk with your doctor about the best form of exercise for you (dancing, walking, swimming, cycling).  DIET/WEIGHT Goal is to maintain a healthy weight  Your discharge diet is: Diet Heart Room service appropriate? Yes; Fluid  consistency: Thin  liquids Your height is:  Height: 5\' 4"  (162.6 cm) Your current weight is: Weight: 87 kg (191 lb 12.8 oz) Your Body Mass Index (BMI) is:  BMI (Calculated): 32.91  Following the type of diet specifically designed for you will help prevent another stroke.  Your goal weight is: 145 lbs  Your goal Body Mass Index (BMI) is 19-24.  Healthy food habits can help reduce 3 risk factors for stroke:  High cholesterol, hypertension, and excess weight.  RESOURCES Stroke/Support Group:  Call [phone]   STROKE EDUCATION PROVIDED/REVIEWED AND GIVEN TO PATIENT Stroke warning signs and symptoms How to activate emergency medical system (call 911). Medications prescribed at discharge. Need for follow-up after discharge. Personal risk factors for stroke. Pneumonia vaccine given:  Flu vaccine given:  My questions have been answered, the writing is legible, and I understand these instructions.  I will adhere to these goals & educational materials that have been provided to me after my discharge from the hospital.     My questions have been answered and I understand these instructions. I will adhere to these goals and the provided educational materials after my discharge from the hospital.  Patient/Caregiver Signature _______________________________ Date __________  Clinician Signature _______________________________________ Date __________  Please bring this form and your medication list with you to all your follow-up doctor's appointments.

## 2017-07-11 NOTE — Progress Notes (Signed)
[facility] PHYSICAL MEDICINE & REHABILITATION     PROGRESS NOTE    Subjective/Complaints: Just waking up.  In good spirits.  Excited to go home today but a bit anxious too  ROS: pt denies nausea, vomiting, diarrhea, cough, shortness of breath or chest pain   Objective: Vital Signs: Blood pressure (!) 157/57, pulse 68, temperature 97.7 F (36.5 C), temperature source Oral, resp. rate 18, height 5\' 4"  (1.626 m), weight 88 kg (194 lb 0.1 oz), SpO2 94 %. No results found. No results for input(s): WBC, HGB, HCT, PLT in the last 72 hours. No results for input(s): NA, K, CL, GLUCOSE, BUN, CREATININE, CALCIUM in the last 72 hours.  Invalid input(s): CO CBG (last 3)  No results for input(s): GLUCAP in the last 72 hours.  Wt Readings from Last 3 Encounters:  07/08/17 88 kg (194 lb 0.1 oz)  06/28/17 86.7 kg (191 lb 2.2 oz)  11/30/16 83.9 kg (185 lb)    Physical Exam:  Constitutional: She is oriented to person, place, and time. She appears well-developed and well-nourished. No distress.  HENT:  Head: Normocephalic and atraumatic.  Mouth/Throat: Oropharynx is clear and moist.  Eyes: Conjunctivae and EOM are normal. Pupils are equal, round, and reactive to light. Right eye exhibits no discharge. Left eye exhibits no discharge.  Neck: Normal range of motion. Neck supple.  Cardiovascular: Irregularly irregular without JVD or murmur      Respiratory: CTA Bilaterally without wheezes or rales. Normal effort  GI: Soft. Bowel sounds are normal. She exhibits no distension. There is no tenderness.  Musculoskeletal: She exhibits no edema or tenderness.  Neurological: She is alert and oriented to person, place, and time.  Reasonable insight and awareness HOH.  Speech slightly dysarthric. Strength remains grossly 4 out of 5 left upper and left lower extremity in 4 to 4+ out of 5 right upper and right lower extremity. Normal sensationrflexes are 1+stable exam  skin: Skin is warm and dry. No  erythema.  Psychiatric: Pleasant    Assessment/Plan: 1.  Functional and balance/gait deficits secondary to cerebellar infarct which require 3+ hours per day of interdisciplinary therapy in a comprehensive inpatient rehab setting. Physiatrist is providing close team supervision and 24 hour management of active medical problems listed below. Physiatrist and rehab team continue to assess barriers to discharge/monitor patient progress toward functional and medical goals.  Function:  Bathing Bathing position   Position: Shower  Bathing parts Body parts bathed by patient: Right arm, Left arm, Chest, Abdomen, Front perineal area, Right upper leg, Left upper leg, Right lower leg, Left lower leg, Back, Buttocks Body parts bathed by helper: Buttocks  Bathing assist Assist Level: Supervision or verbal cues      Upper Body Dressing/Undressing Upper body dressing   What is the patient wearing?: Pull over shirt/dress     Pull over shirt/dress - Perfomed by patient: Thread/unthread right sleeve, Thread/unthread left sleeve, Put head through opening, Pull shirt over trunk          Upper body assist Assist Level: Supervision or verbal cues      Lower Body Dressing/Undressing Lower body dressing   What is the patient wearing?: Underwear, Pants, Socks, Shoes Underwear - Performed by patient: Thread/unthread right underwear leg, Thread/unthread left underwear leg, Pull underwear up/down   Pants- Performed by patient: Thread/unthread right pants leg, Thread/unthread left pants leg, Pull pants up/down Pants- Performed by helper: Pull pants up/down Non-skid slipper socks- Performed by patient: Don/doff right sock, Don/doff left sock  Socks - Performed by patient: Don/doff right sock, Don/doff left sock   Shoes - Performed by patient: Don/doff right shoe, Don/doff left shoe, Fasten right, Fasten left            Lower body assist Assist for lower body dressing: Supervision or verbal cues       Toileting Toileting Toileting activity did not occur: No continent bowel/bladder event Toileting steps completed by patient: Adjust clothing prior to toileting, Performs perineal hygiene, Adjust clothing after toileting Toileting steps completed by helper: Performs perineal hygiene Toileting Assistive Devices: Grab bar or rail  Toileting assist Assist level: Supervision or verbal cues   Transfers Chair/bed transfer Chair/bed transfer activity did not occur: Safety/medical concerns Chair/bed transfer method: Stand pivot Chair/bed transfer assist level: Supervision or verbal cues Chair/bed transfer assistive device: Armrests     Locomotion Ambulation     Max distance: 175 Assist level: Supervision or verbal cues   Wheelchair   Type: Manual Max wheelchair distance: 75 Assist Level: Supervision or verbal cues(for exercise)  Cognition Comprehension Comprehension assist level: Understands complex 90% of the time/cues 10% of the time  Expression Expression assist level: Expresses complex 90% of the time/cues < 10% of the time  Social Interaction Social Interaction assist level: Interacts appropriately with others with medication or extra time (anti-anxiety, antidepressant).  Problem Solving Problem solving assist level: Solves basic 90% of the time/requires cueing < 10% of the time  Memory Memory assist level: Recognizes or recalls 90% of the time/requires cueing < 10% of the time   Medical Problem List and Plan: 1. Functional and balance deficits secondary to left cerebellar infarct -LUE and LLE ataxia and gait disorder.     -Discharged home today  -Patient to see Rehab MD in the office for transitional care encounter in 1-2 weeks if possible.  2. DVT Prophylaxis/Anticoagulation: Pharmaceutical: Lovenox 3. Pain Management: N/A 4. Mood: LCSW to follow for evaluation and support.  5. Neuropsych: This patient is capable of making decisions on her own behalf. 6.  Skin/Wound Care: Routine pressure relief measures 7. Fluids/Electrolytes/Nutrition: Encourage p.o. intake     -P.o. intake is fair.   -Continue dietary supplements 8. HTN: Monitor BP bid --continue Lasix and Cozaar daily.  Blood pressure under control at present Vitals:   07/10/17 1300 07/11/17 0433  BP: (!) 126/58 (!) 157/57  Pulse: 63 68  Resp: 16 18  Temp: 98.8 F (37.1 C) 97.7 F (36.5 C)  SpO2: 98% 94%  9. Frequent UTIs: On macrodantin bid--prophylactic                -Timed toileting to reduce incontinence has been effective   -Fluid rationing as well to avoid increased output at night was discussed again 10. Anemia: on iron supplement.  Hemoglobin holding at 11.5 11. Dyslipidemia: On Lipitor. 12. RLS: Managed with tizanidine--continue to hold.  Monitor and resume if needed.     LOS (Days) [place] T, MD 07/11/2017 9:58 AM

## 2017-07-11 NOTE — Progress Notes (Signed)
Patient discharged with son-in-law at 37. All discharge instructions given to family member. All questions answered. Patient discharged with all belongings.

## 2017-08-04 ENCOUNTER — Other Ambulatory Visit: Payer: Self-pay | Admitting: Specialist

## 2017-08-04 DIAGNOSIS — J9859 Other diseases of mediastinum, not elsewhere classified: Secondary | ICD-10-CM

## 2017-08-19 ENCOUNTER — Inpatient Hospital Stay: Payer: Medicare Other | Admitting: Physical Medicine & Rehabilitation

## 2017-08-24 ENCOUNTER — Encounter: Payer: Medicare Other | Attending: Physical Medicine & Rehabilitation | Admitting: Physical Medicine & Rehabilitation

## 2017-08-24 ENCOUNTER — Encounter: Payer: Self-pay | Admitting: Physical Medicine & Rehabilitation

## 2017-08-24 VITALS — BP 99/68 | HR 145

## 2017-08-24 DIAGNOSIS — Z9889 Other specified postprocedural states: Secondary | ICD-10-CM | POA: Diagnosis not present

## 2017-08-24 DIAGNOSIS — R51 Headache: Secondary | ICD-10-CM | POA: Insufficient documentation

## 2017-08-24 DIAGNOSIS — Z9049 Acquired absence of other specified parts of digestive tract: Secondary | ICD-10-CM | POA: Diagnosis not present

## 2017-08-24 DIAGNOSIS — I639 Cerebral infarction, unspecified: Secondary | ICD-10-CM | POA: Diagnosis present

## 2017-08-24 DIAGNOSIS — G629 Polyneuropathy, unspecified: Secondary | ICD-10-CM | POA: Diagnosis not present

## 2017-08-24 DIAGNOSIS — Z809 Family history of malignant neoplasm, unspecified: Secondary | ICD-10-CM | POA: Diagnosis not present

## 2017-08-24 DIAGNOSIS — I10 Essential (primary) hypertension: Secondary | ICD-10-CM | POA: Insufficient documentation

## 2017-08-24 DIAGNOSIS — E785 Hyperlipidemia, unspecified: Secondary | ICD-10-CM | POA: Insufficient documentation

## 2017-08-24 DIAGNOSIS — G4733 Obstructive sleep apnea (adult) (pediatric): Secondary | ICD-10-CM | POA: Diagnosis not present

## 2017-08-24 DIAGNOSIS — I272 Pulmonary hypertension, unspecified: Secondary | ICD-10-CM | POA: Diagnosis not present

## 2017-08-24 DIAGNOSIS — I2510 Atherosclerotic heart disease of native coronary artery without angina pectoris: Secondary | ICD-10-CM | POA: Insufficient documentation

## 2017-08-24 DIAGNOSIS — Z90710 Acquired absence of both cervix and uterus: Secondary | ICD-10-CM | POA: Insufficient documentation

## 2017-08-24 NOTE — Progress Notes (Signed)
Subjective:    Patient ID: Sara Robertson, female    DOB: [birth year], 84 y.o.   MRN: [id number]  HPI Sara Robertson is here in follow-up of her left cerebellar infarct.  She is been at the assisted living facility and doing quite well.  She graduated from therapy couple weeks ago and now is doing classes that are offered there for exercise.  She is working on her speech by herself.  She is feeling more steady on her feet.  She uses a Rollator for balance.  He has not had any falls.  She is continent of bowel and bladder although her bladder still is frequent.  She denies pain.  She is very pleased with where she is at the moment.  Pain Inventory Average Pain 0 Pain Right Now 0 My pain is  no pain  In the last 24 hours, has pain interfered with the following? General activity 0 Relation with others 0 Enjoyment of life 0 What TIME of day is your pain at its worst?  no pain Sleep (in general) Poor  Pain is worse with:  no pain Pain improves with:  no pain Relief from Meds: no pain  Mobility walk with assistance use a walker  Function retired  Neuro/Psych trouble walking  Prior Studies hospital f/u  Physicians involved in your care hospital f/u   Family History  Problem Relation Age of Onset  . Cancer Mother   . Cancer Sister   . Cancer Brother    Social History   Socioeconomic History  . Marital status: Widowed    Spouse name: None  . Number of children: None  . Years of education: None  . Highest education level: None  Social Needs  . Financial resource strain: None  . Food insecurity - worry: None  . Food insecurity - inability: None  . Transportation needs - medical: None  . Transportation needs - non-medical: None  Occupational History  . None  Tobacco Use  . Smoking status: Never Smoker  . Smokeless tobacco: Never Used  Substance and Sexual Activity  . Alcohol use: No  . Drug use: None  . Sexual activity: None  Other Topics Concern  . None    Social History Narrative  . None   Past Surgical History:  Procedure Laterality Date  . APPENDECTOMY    . BLADDER SUSPENSION    . CARDIAC CATHETERIZATION  2010  . CARPAL TUNNEL RELEASE Bilateral   . CHOLECYSTECTOMY    . LUMBAR LAMINECTOMY    . TONSILLECTOMY    . VAGINAL HYSTERECTOMY     Past Medical History:  Diagnosis Date  . Anemia   . Angina pectoris ([place])   . CAD (coronary artery disease)   . Diverticulosis   . GERD (gastroesophageal reflux disease)   . H/O carcinoid syndrome   . Headache   . History of tinnitus   . Hyperlipemia   . Hypertension   . OSA (obstructive sleep apnea)   . Peripheral neuropathy   . Pulmonary HTN ([place])    with noctural hypoxia?  . SOB (shortness of breath)    BP 99/68 (BP Location: Left Arm, Patient Position: Sitting, Cuff Size: Normal)   Pulse (!) 145   SpO2 95%   Opioid Risk Score:   Fall Risk Score:  `1  Depression screen PHQ 2/9  No flowsheet data found. Review of Systems  Constitutional: Negative.   HENT: Negative.   Eyes: Negative.   Respiratory: Negative.   Cardiovascular: Negative.  Gastrointestinal: Negative.   Endocrine: Negative.   Genitourinary: Negative.   Musculoskeletal: Positive for gait problem.  Skin: Negative.   Allergic/Immunologic: Negative.   Hematological: Negative.   Psychiatric/Behavioral: Negative.        Objective:   Physical Exam  Constitutional: She is oriented to person, place, and time. She appears well-developed and well-nourished. No distress.  HENT:  Head: Normocephalic and atraumatic.  Mouth/Throat: Moist.  Eyes: PERRL  Neck: Normal range of motion. Neck supple.  Cardiovascular: Regular rate      Respiratory: Normal effort GI: Soft. Bowel sounds are normal. She exhibits no distension. There is no tenderness.  Musculoskeletal: She exhibits no edema or tenderness.  Neurological: good awareness HOH.  Speech slightly dysarthric.  Strength grossly 4+ to 5 out of 5 left upper  extremity and left lower extremity.  She is 5 out of 5 on the right.  Coordination with finger to nose and heel to shin is now functional.  She stood with safe technique.  She used her rolling walker with good form and mechanics.  Normal heel strike and foot clearance was noted. skin: Skin is warm and dry. No erythema.  Psychiatric: Pleasant        Assessment & Plan:  Medical Problem List and Plan: 1. Functional and balance deficits secondary to left cerebellar infarct - continue HEP    -She is doing quite well at this point.  I have nothing new to offer from a therapeutic standpoint.   -Discussed safe use of her walker and safety awareness in general   3 HTN:  her primary.  BP was low today but she is asymptomatic .4 urinary frequency                        - reviewed fluid rationing and the idea of drinking more water in the morning and less at night   15 minutes of direct patient time was spent today.  We discussed stroke risk factors because of her stroke in general.  I will see her back as needed in future.  All questions were encouraged and answered

## 2017-08-24 NOTE — Patient Instructions (Signed)
PLEASE FEEL FREE TO CALL OUR OFFICE WITH ANY PROBLEMS OR QUESTIONS ([phone])      FLUID RATIONING!  DRINK MORE IN THE MORNING!!!!!

## 2017-09-14 ENCOUNTER — Ambulatory Visit (INDEPENDENT_AMBULATORY_CARE_PROVIDER_SITE_OTHER): Payer: Medicare Other | Admitting: Diagnostic Neuroimaging

## 2017-09-14 ENCOUNTER — Encounter: Payer: Self-pay | Admitting: Diagnostic Neuroimaging

## 2017-09-14 VITALS — BP 138/87 | HR 84 | Wt 184.8 lb

## 2017-09-14 DIAGNOSIS — I639 Cerebral infarction, unspecified: Secondary | ICD-10-CM

## 2017-09-14 DIAGNOSIS — I10 Essential (primary) hypertension: Secondary | ICD-10-CM | POA: Diagnosis not present

## 2017-09-14 DIAGNOSIS — I63442 Cerebral infarction due to embolism of left cerebellar artery: Secondary | ICD-10-CM

## 2017-09-14 NOTE — Progress Notes (Signed)
[facility]  PATIENT: Sara Robertson DOB: [birth year]  REFERRING CLINICIAN: Princella Ion, MD HISTORY FROM: patient and daughter and chart review  REASON FOR VISIT: new consult    HISTORICAL  CHIEF COMPLAINT:  Chief Complaint  Patient presents with  . Stroke    rm 6, New Pt, hospital FU, dgtr- [name], "has joined exercise classes again"     HISTORY OF PRESENT ILLNESS:   84 year old female with hypertension, hyperlipidemia, coronary artery disease, who presented to the hospital with right-sided weakness and facial droop.  Patient was found to have left cerebellar ischemic infarction.  Patient was found to have paroxysmal atrial fibrillation, but patient and family did not want to pursue anticoagulation due to patient's age and history of falls.  Patient completed inpatient rehabilitation.  Now living back at home at independent living.  Patient is doing well.  Tolerating medications.  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  She was one of the first members of the [employer].   REVIEW OF SYSTEMS: Full 14 system review of systems performed and negative with exception of: Weakness slurred speech insomnia restless legs decreased energy urination problems incontinence shortness of breath fatigue.  ALLERGIES: Allergies  Allergen Reactions  . Celebrex [Celecoxib] Nausea And Vomiting  . Motrin [Ibuprofen] Nausea And Vomiting  . Nexium [Esomeprazole Magnesium] Nausea And Vomiting    HOME MEDICATIONS: Outpatient Medications Prior to Visit  Medication Sig Dispense Refill  . acetaminophen (TYLENOL) 325 MG tablet Take 1-2 tablets (325-650 mg total) by mouth every 4 (four) hours as needed for mild pain.    Marland Kitchen aspirin 325 MG tablet Take 1 tablet (325 mg total) by mouth daily.    Marland Kitchen atorvastatin (LIPITOR) 20 MG tablet Take 1 tablet (20 mg total) by mouth daily at 6 PM. 30 tablet 0  . ferrous sulfate 325 (65 FE) MG tablet Take 1 tablet (325  mg total) by mouth daily with breakfast. 30 tablet 0  . furosemide (LASIX) 40 MG tablet Take 0.5 tablets (20 mg total) by mouth daily. 30 tablet 0  . losartan (COZAAR) 50 MG tablet Take 1 tablet (50 mg total) by mouth daily. 30 tablet 0  . metoprolol succinate (TOPROL-XL) 25 MG 24 hr tablet Take 25 mg by mouth daily.    . Multiple Vitamins-Minerals (MULTIVITAMIN WITH MINERALS) tablet Take 1 tablet by mouth daily.    . nitrofurantoin (MACRODANTIN) 100 MG capsule Take 100 mg by mouth 2 (two) times daily.    Marland Kitchen senna-docusate (SENOKOT-S) 8.6-50 MG tablet Take 1 tablet by mouth 2 (two) times daily.    . vitamin B-12 500 MCG tablet Take 1 tablet (500 mcg total) by mouth daily.    . Multiple Vitamins-Minerals (PRESERVISION AREDS 2 PO) Take 1 capsule by mouth daily.     Facility-Administered Medications Prior to Visit  Medication Dose Route Frequency Provider Last Rate Last Dose  . 0.9 %  sodium chloride infusion  250 mL Intravenous Once Nolon Stalls C, MD      . acetaminophen (TYLENOL) tablet 650 mg  650 mg Oral Once Nolon Stalls C, MD      . diphenhydrAMINE (BENADRYL) capsule 25 mg  25 mg Oral Once Corcoran, Melissa C, MD      . heparin lock flush 100 unit/mL  500 Units Intracatheter Daily PRN Corcoran, Melissa C, MD      . heparin lock flush 100 unit/mL  250 Units Intracatheter PRN Lequita Asal, MD      .  sodium chloride 0.9 % injection 10 mL  10 mL Intracatheter PRN Corcoran, Melissa C, MD      . sodium chloride 0.9 % injection 3 mL  3 mL Intracatheter PRN Lequita Asal, MD        PAST MEDICAL HISTORY: Past Medical History:  Diagnosis Date  . Anemia   . Angina pectoris ([place])   . CAD (coronary artery disease)   . Diverticulosis   . GERD (gastroesophageal reflux disease)   . H/O carcinoid syndrome   . Headache   . History of tinnitus   . Hyperlipemia   . Hypertension   . OSA (obstructive sleep apnea)   . Peripheral neuropathy   . Pulmonary HTN ([place])    with  noctural hypoxia?  . SOB (shortness of breath)   . Stroke [facility]) 06/30/2017    PAST SURGICAL HISTORY: Past Surgical History:  Procedure Laterality Date  . APPENDECTOMY    . BLADDER SUSPENSION    . CARDIAC CATHETERIZATION  2010  . CARPAL TUNNEL RELEASE Bilateral   . CHOLECYSTECTOMY    . LUMBAR LAMINECTOMY    . TONSILLECTOMY    . VAGINAL HYSTERECTOMY      FAMILY HISTORY: Family History  Problem Relation Age of Onset  . Cancer Mother   . Cancer Sister   . Cancer Brother     SOCIAL HISTORY:  Social History   Socioeconomic History  . Marital status: Widowed    Spouse name: Not on file  . Number of children: Not on file  . Years of education: Not on file  . Highest education level: Not on file  Social Needs  . Financial resource strain: Not on file  . Food insecurity - worry: Not on file  . Food insecurity - inability: Not on file  . Transportation needs - medical: Not on file  . Transportation needs - non-medical: Not on file  Occupational History  . Not on file  Tobacco Use  . Smoking status: Never Smoker  . Smokeless tobacco: Never Used  Substance and Sexual Activity  . Alcohol use: No  . Drug use: No  . Sexual activity: Not on file  Other Topics Concern  . Not on file  Social History Narrative   Lives at [place], independent living   Education- college   Children- 7   Caffeine- sweet tea, one glass daily      PHYSICAL EXAM  GENERAL EXAM/CONSTITUTIONAL: Vitals:  Vitals:   09/14/17 0953 09/14/17 1004  BP: 138/87   Pulse: (!) 125 84  Weight: 184 lb 12.8 oz (83.8 kg)      Body mass index is 31.72 kg/m.  Visual Acuity Screening   Right eye Left eye Both eyes  Without correction: 20/50 20/50   With correction:        Patient is in no distress; well developed, nourished and groomed; neck is supple  CARDIOVASCULAR:  Examination of carotid arteries is normal; no carotid bruits  Regular rate and rhythm, no  murmurs  Examination of peripheral vascular system by observation and palpation is normal  EYES:  Ophthalmoscopic exam of optic discs and posterior segments is normal; no papilledema or hemorrhages  MUSCULOSKELETAL:  Gait, strength, tone, movements noted in Neurologic exam below  NEUROLOGIC: MENTAL STATUS:  No flowsheet data found.  awake, alert, oriented to person, place and time  recent and remote memory intact  normal attention and concentration  language fluent, comprehension intact, naming intact,   fund of knowledge appropriate  CRANIAL NERVE:   2nd - no papilledema on fundoscopic exam  2nd, 3rd, 4th, 6th - pupils equal and reactive to light, visual fields full to confrontation, extraocular muscles intact, no nystagmus  5th - facial sensation symmetric  7th - facial strength symmetric  8th - hearing intact  9th - palate elevates symmetrically, uvula midline  11th - shoulder shrug symmetric  12th - tongue protrusion midline  VERY MILD DYSARTHRIA  MOTOR:   normal bulk and tone, full strength in the BUE, BLE  SENSORY:   normal and symmetric to light touch, pinprick, temperature  DECR VIB AT FINGER; ABSENT VIB AT TOES  COORDINATION:   finger-nose-finger, fine finger movements --> MILD DYSMETRIA IN BUE  REFLEXES:   deep tendon reflexes 1+ and symmetric  GAIT/STATION:   narrow based gait; ABLE TO STAND AND WALK WITH ROLLATOR    DIAGNOSTIC DATA (LABS, IMAGING, TESTING) - I reviewed patient records, labs, notes, testing and imaging myself where available.  Lab Results  Component Value Date   WBC 8.1 07/07/2017   HGB 10.9 (L) 07/07/2017   HCT 34.0 (L) 07/07/2017   MCV 94.7 07/07/2017   PLT 275 07/07/2017      Component Value Date/Time   NA 136 07/07/2017 0513   K 4.1 07/07/2017 0513   CL 102 07/07/2017 0513   CO2 29 07/07/2017 0513   GLUCOSE 104 (H) 07/07/2017 0513   BUN 15 07/07/2017 0513   CREATININE 0.89 07/07/2017 0513    CALCIUM 8.7 (L) 07/07/2017 0513   PROT 5.8 (L) 07/01/2017 0959   ALBUMIN 2.7 (L) 07/01/2017 0959   AST 21 07/01/2017 0959   ALT 17 07/01/2017 0959   ALKPHOS 55 07/01/2017 0959   BILITOT 0.6 07/01/2017 0959   GFRNONAA 53 (L) 07/07/2017 0513   GFRAA >60 07/07/2017 0513   Lab Results  Component Value Date   CHOL 179 06/29/2017   HDL 40 (L) 06/29/2017   LDLCALC 125 (H) 06/29/2017   TRIG 71 06/29/2017   CHOLHDL 4.5 06/29/2017   Lab Results  Component Value Date   HGBA1C 5.4 06/29/2017   Lab Results  Component Value Date   VITAMINB12 286 06/29/2015   No results found for: TSH   06/28/17 CTA head / neck - Non flow-limiting intracranial and extracranial atherosclerotic disease as described. - With regard to the clinical question, there is no basilar stenosis or dissection. No secondary signs of posterior circulation infarction. Non stenotic calcific atheromatous change of the distal vertebral arteries is observed. -  Pathologic mediastinal adenopathy is incompletely evaluated. CT of the chest with contrast is recommended for further evaluation.   06/28/17 MRI brain [I reviewed images myself and agree with interpretation. -VRP]  1. Mild motion degraded examination. 2. Multiple acute small LEFT cerebellar/SCA territory nonhemorrhagic infarcts. 3. Otherwise negative noncontrast MRI of the head for age.  06/29/17 TTE - Left ventricle: The cavity size was normal. There was mild   concentric hypertrophy. Systolic function was normal. The   estimated ejection fraction was in the range of 50% to 55%. Wall   motion was normal; there were no regional wall motion   abnormalities. Features are consistent with a pseudonormal left   ventricular filling pattern, with concomitant abnormal relaxation   and increased filling pressure (grade 2 diastolic dysfunction).   Doppler parameters are consistent with indeterminate ventricular   filling pressure. - Aortic valve: Valve mobility was  restricted. There was moderate   stenosis. There was no regurgitation. Peak velocity (S): 341  cm/s. Mean gradient (S): 24 mm Hg. Valve area (VTI): 0.55 cm^2.   Valve area (Vmax): 0.63 cm^2. Valve area (Vmean): 0.63 cm^2. - Mitral valve: Transvalvular velocity was within the normal range.   There was no evidence for stenosis. There was trivial   regurgitation. - Left atrium: The atrium was severely dilated. - Tricuspid valve: There was mild regurgitation. - Pulmonary arteries: Systolic pressure was severely increased. PA   peak pressure: 82 mm Hg (S).     ASSESSMENT AND PLAN  84 y.o. year old female here with left cerebellar ischemic infarction due to atherosclerosis. Doing well.    Dx:  1. Cerebrovascular accident (CVA) due to embolism of left cerebellar artery ([place])   2. Benign essential HTN     PLAN:  STROKE PREVENTION - continue aspirin 325, lipitor 20, BP control  MEDIASTINAL ADENOPATHY - likely incidental finding; patient would like to defer any additional testing at this time  Return if symptoms worsen or fail to improve, for return to PCP.    Penni Bombard, MD 03/26/9406, 68:08 AM Certified in Neurology, Neurophysiology and Neuroimaging  [facility] [address] [place], [place] [address] [phone]

## 2017-09-15 ENCOUNTER — Ambulatory Visit: Payer: Self-pay | Admitting: Diagnostic Neuroimaging

## 2018-03-18 ENCOUNTER — Other Ambulatory Visit: Payer: Self-pay | Admitting: Otolaryngology

## 2018-03-18 DIAGNOSIS — C029 Malignant neoplasm of tongue, unspecified: Secondary | ICD-10-CM

## 2018-03-24 ENCOUNTER — Ambulatory Visit
Admission: RE | Admit: 2018-03-24 | Discharge: 2018-03-24 | Disposition: A | Discharge status: Home / Self Care | Payer: Medicare Other | Source: Ambulatory Visit | Attending: Otolaryngology | Admitting: Otolaryngology

## 2018-03-24 DIAGNOSIS — C029 Malignant neoplasm of tongue, unspecified: Secondary | ICD-10-CM | POA: Insufficient documentation

## 2018-03-24 DIAGNOSIS — I700 Atherosclerosis of aorta: Secondary | ICD-10-CM | POA: Diagnosis not present

## 2018-03-24 DIAGNOSIS — I2510 Atherosclerotic heart disease of native coronary artery without angina pectoris: Secondary | ICD-10-CM | POA: Insufficient documentation

## 2018-03-24 LAB — GLUCOSE, CAPILLARY: GLUCOSE-CAPILLARY: 107 mg/dL — AB (ref 70–99)

## 2018-03-24 MED ORDER — FLUDEOXYGLUCOSE F - 18 (FDG) INJECTION
9.2200 | Freq: Once | INTRAVENOUS | Status: AC | PRN
  Administered 2018-03-24: 9.22 via INTRAVENOUS

## 2018-03-27 ENCOUNTER — Other Ambulatory Visit: Payer: Self-pay

## 2018-03-27 ENCOUNTER — Encounter: Payer: Self-pay | Admitting: Gynecology

## 2018-03-27 ENCOUNTER — Ambulatory Visit (INDEPENDENT_AMBULATORY_CARE_PROVIDER_SITE_OTHER)
Admission: EM | Admit: 2018-03-27 | Discharge: 2018-03-27 | Disposition: A | Discharge status: Home / Self Care | Payer: Medicare Other | Source: Home / Self Care | Attending: Family Medicine | Admitting: Family Medicine

## 2018-03-27 DIAGNOSIS — K56609 Unspecified intestinal obstruction, unspecified as to partial versus complete obstruction: Secondary | ICD-10-CM | POA: Diagnosis not present

## 2018-03-27 DIAGNOSIS — R300 Dysuria: Secondary | ICD-10-CM

## 2018-03-27 HISTORY — DX: Malignant (primary) neoplasm, unspecified: C80.1

## 2018-03-27 LAB — URINALYSIS, COMPLETE (UACMP) WITH MICROSCOPIC
Bacteria, UA: NONE SEEN
GLUCOSE, UA: NEGATIVE mg/dL
HGB URINE DIPSTICK: NEGATIVE
Ketones, ur: 15 mg/dL — AB
Leukocytes, UA: NEGATIVE
NITRITE: NEGATIVE
PROTEIN: 100 mg/dL — AB
RBC / HPF: NONE SEEN RBC/hpf (ref 0–5)
SPECIFIC GRAVITY, URINE: 1.02 (ref 1.005–1.030)
WBC UA: NONE SEEN WBC/hpf (ref 0–5)
pH: 5.5 (ref 5.0–8.0)

## 2018-03-27 MED ORDER — CEPHALEXIN 500 MG PO CAPS: 500.0000 mg | ORAL_CAPSULE | Freq: Two times a day (BID) | ORAL | 0 refills | Status: AC

## 2018-03-27 NOTE — Discharge Instructions (Addendum)
Take medication as prescribed. Rest. Drink plenty of fluids. Monitor closely.   Follow up with your primary care physician this week as needed. Return to Urgent care for new or worsening concerns.   

## 2018-03-27 NOTE — ED Provider Notes (Signed)
[facility] ____________________________________________  Time seen: Approximately 1:50 PM  I have reviewed the triage vital signs and the nursing notes.   HISTORY  Chief Complaint Urinary Tract Infection  HPI Sara Robertson is a 84 y.o. female presenting with son-in-law at bedside for evaluation of some urinary frequency, mild discomfort with urination and low back discomfort.  Patient reports the symptoms have started since yesterday and are consistent with her recurrent urinary tract infections.  Reports that she currently takes Macrobid 50 mg daily prophylactically.  States last breakthrough urinary tract infection was approximately 3 months ago and treated with Cipro.  States also having some constipation in which she reports this is a normal pattern for her.  Has not used any of her normal laxatives yet but plans to do so when she gets home.  Reports that she has IBS with intermittent constipation.  Continues to pass gas normally and has some bloating to her abdomen but denies abdominal pain.  Denies any blood in stool or urine.  Continues to eat and drink normally.  Denies of vomiting, fevers, chest pain, shortness of breath or other complaints.Denies recent sickness. Denies recent antibiotic use.  Denies injury.  Idelle Crouch, MD: PCP   Past Medical History:  Diagnosis Date  . Anemia   . Angina pectoris ([place])   . CAD (coronary artery disease)   . Cancer ([facility])    tongue  . Diverticulosis   . GERD (gastroesophageal reflux disease)   . H/O carcinoid syndrome   . Headache   . History of tinnitus   . Hyperlipemia   . Hypertension   . OSA (obstructive sleep apnea)   . Peripheral neuropathy   . Pulmonary HTN ([place])    with noctural hypoxia?  . SOB (shortness of breath)   . Stroke [facility]) 06/30/2017    Patient Active Problem List   Diagnosis Date Noted  . Stroke (cerebrum) ([place]) 06/30/2017  . Cerebellar infarct ([place])   . Acute lower UTI   . PAF  (paroxysmal atrial fibrillation) ([place])   . PAH (pulmonary artery hypertension) ([place])   . Benign essential HTN   . Diastolic dysfunction   . OSA (obstructive sleep apnea)   . Coronary artery disease involving native coronary artery of native heart without angina pectoris   . Acute blood loss anemia   . Acute CVA (cerebrovascular accident) ([place]) 06/28/2017  . Iron deficiency anemia 06/29/2015    Past Surgical History:  Procedure Laterality Date  . APPENDECTOMY    . BLADDER SUSPENSION    . CARDIAC CATHETERIZATION  2010  . CARPAL TUNNEL RELEASE Bilateral   . CHOLECYSTECTOMY    . LUMBAR LAMINECTOMY    . TONSILLECTOMY    . VAGINAL HYSTERECTOMY       No current facility-administered medications for this encounter.   Current Outpatient Medications:  .  acetaminophen (TYLENOL) 325 MG tablet, Take 1-2 tablets (325-650 mg total) by mouth every 4 (four) hours as needed for mild pain., Disp: , Rfl:  .  aspirin 325 MG tablet, Take 1 tablet (325 mg total) by mouth daily., Disp: , Rfl:  .  atorvastatin (LIPITOR) 20 MG tablet, Take 1 tablet (20 mg total) by mouth daily at 6 PM., Disp: 30 tablet, Rfl: 0 .  ferrous sulfate 325 (65 FE) MG tablet, Take 1 tablet (325 mg total) by mouth daily with breakfast., Disp: 30 tablet, Rfl: 0 .  furosemide (LASIX) 40 MG tablet, Take 0.5 tablets (20 mg total) by mouth daily.,  Disp: 30 tablet, Rfl: 0 .  losartan (COZAAR) 50 MG tablet, Take 1 tablet (50 mg total) by mouth daily., Disp: 30 tablet, Rfl: 0 .  metoprolol succinate (TOPROL-XL) 25 MG 24 hr tablet, Take 25 mg by mouth daily., Disp: , Rfl:  .  Multiple Vitamins-Minerals (MULTIVITAMIN WITH MINERALS) tablet, Take 1 tablet by mouth daily., Disp: , Rfl:  .  nitrofurantoin (MACRODANTIN) 100 MG capsule, Take 100 mg by mouth 2 (two) times daily., Disp: , Rfl:  .  senna-docusate (SENOKOT-S) 8.6-50 MG tablet, Take 1 tablet by mouth 2 (two) times daily., Disp: , Rfl:  .  vitamin B-12 500 MCG tablet, Take 1 tablet  (500 mcg total) by mouth daily., Disp: , Rfl:  .  cephALEXin (KEFLEX) 500 MG capsule, Take 1 capsule (500 mg total) by mouth 2 (two) times daily for 7 days., Disp: 14 capsule, Rfl: 0  Facility-Administered Medications Ordered in Other Encounters:  .  0.9 %  sodium chloride infusion, 250 mL, Intravenous, Once, Corcoran, Melissa C, MD .  acetaminophen (TYLENOL) tablet 650 mg, 650 mg, Oral, Once, Corcoran, Melissa C, MD .  diphenhydrAMINE (BENADRYL) capsule 25 mg, 25 mg, Oral, Once, Corcoran, Melissa C, MD .  heparin lock flush 100 unit/mL, 500 Units, Intracatheter, Daily PRN, Corcoran, Melissa C, MD .  heparin lock flush 100 unit/mL, 250 Units, Intracatheter, PRN, Corcoran, Melissa C, MD .  sodium chloride 0.9 % injection 10 mL, 10 mL, Intracatheter, PRN, Corcoran, Melissa C, MD .  sodium chloride 0.9 % injection 3 mL, 3 mL, Intracatheter, PRN, Mike Gip, Drue Second, MD  Allergies Celebrex [celecoxib]; Motrin [ibuprofen]; and Nexium [esomeprazole magnesium]  Family History  Problem Relation Age of Onset  . Cancer Mother   . Cancer Sister   . Cancer Brother     Social History Social History   Tobacco Use  . Smoking status: Never Smoker  . Smokeless tobacco: Never Used  Substance Use Topics  . Alcohol use: No  . Drug use: No    Review of Systems Constitutional: No fever/chills Cardiovascular: Denies chest pain. Respiratory: Denies shortness of breath. Gastrointestinal: No abdominal pain.  As above  Genitourinary: Negative for dysuria. Musculoskeletal: positive for back pain. Skin: Negative for rash.   ____________________________________________   PHYSICAL EXAM:  VITAL SIGNS: ED Triage Vitals  Enc Vitals Group     BP 03/27/18 1259 (!) 149/75     Pulse Rate 03/27/18 1259 73     Resp 03/27/18 1259 16     Temp 03/27/18 1259 98.2 F (36.8 C)     Temp Source 03/27/18 1259 Oral     SpO2 03/27/18 1259 98 %     Weight 03/27/18 1254 165 lb (74.8 kg)     Height --       Head Circumference --      Peak Flow --      Pain Score 03/27/18 1253 3     Pain Loc --      Pain Edu? --      Excl. in [place]? --     Constitutional: Alert and oriented. Well appearing and in no acute distress. ENT      Head: Normocephalic and atraumatic. Cardiovascular: Normal rate, regular rhythm. Grossly normal heart sounds.  Good peripheral circulation. Respiratory: Normal respiratory effort without tachypnea nor retractions. Breath sounds are clear and equal bilaterally. No wheezes, rales, rhonchi. Gastrointestinal: Soft and nontender. No distention. Normal Bowel sounds. No CVA tenderness. Musculoskeletal:  No midline cervical, thoracic or lumbar tenderness to  palpation. Neurologic:  Normal speech and language.Speech is normal. No gait instability.  Skin:  Skin is warm, dry and intact. No rash noted. Psychiatric: Mood and affect are normal. Speech and behavior are normal. Patient exhibits appropriate insight and judgment   ___________________________________________   LABS (all labs ordered are listed, but only abnormal results are displayed)  Labs Reviewed  URINALYSIS, COMPLETE (UACMP) WITH MICROSCOPIC - Abnormal; Notable for the following components:      Result Value   APPearance HAZY (*)    Bilirubin Urine SMALL (*)    Ketones, ur 15 (*)    Protein, ur 100 (*)    All other components within normal limits  URINE CULTURE     PROCEDURES Procedures   INITIAL IMPRESSION / ASSESSMENT AND PLAN / ED COURSE  Pertinent labs & imaging results that were available during my care of the patient were reviewed by me and considered in my medical decision making (see chart for details).  Well-appearing patient.  No acute distress.  Reports history of recurrent UTIs with same presentation.  Concern for UTI, however discussed urine not clear UTI.  Will culture urine and begin treatment with oral Keflex.  Stop prophylactic antibiotic at this time and resume once completion of new  antibiotic.  Also discussed use of her laxatives at home that she typically uses for her constipation.  Offered to further evaluate constipation with x-ray at this time, patient declined.  Strongly encouraged sooner follow-up if symptoms continue.Discussed indication, risks and benefits of medications with patient.  Encourage fluids and dietary modifications to assist.  Discussed follow up with Primary care physician this week. Discussed follow up and return parameters including no resolution or any worsening concerns. Patient verbalized understanding and agreed to plan.   ____________________________________________   FINAL CLINICAL IMPRESSION(S) / ED DIAGNOSES  Final diagnoses:  Dysuria     ED Discharge Orders         Ordered    cephALEXin (KEFLEX) 500 MG capsule  2 times daily     03/27/18 1416           Note: This dictation was prepared with Dragon dictation along with smaller phrase technology. Any transcriptional errors that result from this process are unintentional.         Marylene Land, NP 03/27/18 1534

## 2018-03-27 NOTE — ED Triage Notes (Signed)
Per patient c/o burning with urination and lower back pain. Per patient was just diagnose with tongue cancer x 3 days ago.

## 2018-03-28 ENCOUNTER — Encounter: Payer: Self-pay | Admitting: Emergency Medicine

## 2018-03-28 ENCOUNTER — Other Ambulatory Visit: Payer: Self-pay

## 2018-03-28 ENCOUNTER — Emergency Department: Payer: Medicare Other

## 2018-03-28 ENCOUNTER — Inpatient Hospital Stay
Admission: EM | Admit: 2018-03-28 | Discharge: 2018-04-27 | DRG: 388 | Disposition: E | Payer: Medicare Other | Source: Skilled Nursing Facility | Attending: Family Medicine | Admitting: Family Medicine

## 2018-03-28 DIAGNOSIS — C029 Malignant neoplasm of tongue, unspecified: Secondary | ICD-10-CM | POA: Diagnosis present

## 2018-03-28 DIAGNOSIS — G629 Polyneuropathy, unspecified: Secondary | ICD-10-CM | POA: Diagnosis present

## 2018-03-28 DIAGNOSIS — Z8673 Personal history of transient ischemic attack (TIA), and cerebral infarction without residual deficits: Secondary | ICD-10-CM

## 2018-03-28 DIAGNOSIS — K5790 Diverticulosis of intestine, part unspecified, without perforation or abscess without bleeding: Secondary | ICD-10-CM | POA: Diagnosis present

## 2018-03-28 DIAGNOSIS — E860 Dehydration: Secondary | ICD-10-CM | POA: Diagnosis present

## 2018-03-28 DIAGNOSIS — G4733 Obstructive sleep apnea (adult) (pediatric): Secondary | ICD-10-CM | POA: Diagnosis present

## 2018-03-28 DIAGNOSIS — E785 Hyperlipidemia, unspecified: Secondary | ICD-10-CM | POA: Diagnosis present

## 2018-03-28 DIAGNOSIS — I10 Essential (primary) hypertension: Secondary | ICD-10-CM | POA: Diagnosis present

## 2018-03-28 DIAGNOSIS — I272 Pulmonary hypertension, unspecified: Secondary | ICD-10-CM | POA: Diagnosis present

## 2018-03-28 DIAGNOSIS — Z87440 Personal history of urinary (tract) infections: Secondary | ICD-10-CM | POA: Diagnosis not present

## 2018-03-28 DIAGNOSIS — Z888 Allergy status to other drugs, medicaments and biological substances status: Secondary | ICD-10-CM | POA: Diagnosis not present

## 2018-03-28 DIAGNOSIS — I480 Paroxysmal atrial fibrillation: Secondary | ICD-10-CM | POA: Diagnosis present

## 2018-03-28 DIAGNOSIS — K219 Gastro-esophageal reflux disease without esophagitis: Secondary | ICD-10-CM | POA: Diagnosis present

## 2018-03-28 DIAGNOSIS — K56609 Unspecified intestinal obstruction, unspecified as to partial versus complete obstruction: Principal | ICD-10-CM | POA: Diagnosis present

## 2018-03-28 DIAGNOSIS — J189 Pneumonia, unspecified organism: Secondary | ICD-10-CM | POA: Diagnosis present

## 2018-03-28 DIAGNOSIS — J9611 Chronic respiratory failure with hypoxia: Secondary | ICD-10-CM | POA: Diagnosis present

## 2018-03-28 DIAGNOSIS — Z79899 Other long term (current) drug therapy: Secondary | ICD-10-CM

## 2018-03-28 DIAGNOSIS — Z7982 Long term (current) use of aspirin: Secondary | ICD-10-CM | POA: Diagnosis not present

## 2018-03-28 DIAGNOSIS — Z9049 Acquired absence of other specified parts of digestive tract: Secondary | ICD-10-CM | POA: Diagnosis not present

## 2018-03-28 DIAGNOSIS — Z90710 Acquired absence of both cervix and uterus: Secondary | ICD-10-CM | POA: Diagnosis not present

## 2018-03-28 DIAGNOSIS — N179 Acute kidney failure, unspecified: Secondary | ICD-10-CM | POA: Diagnosis present

## 2018-03-28 DIAGNOSIS — I2510 Atherosclerotic heart disease of native coronary artery without angina pectoris: Secondary | ICD-10-CM | POA: Diagnosis present

## 2018-03-28 DIAGNOSIS — Z0189 Encounter for other specified special examinations: Secondary | ICD-10-CM

## 2018-03-28 DIAGNOSIS — Z9981 Dependence on supplemental oxygen: Secondary | ICD-10-CM

## 2018-03-28 DIAGNOSIS — Z66 Do not resuscitate: Secondary | ICD-10-CM | POA: Diagnosis present

## 2018-03-28 LAB — COMPREHENSIVE METABOLIC PANEL
ALBUMIN: 3.7 g/dL (ref 3.5–5.0)
ALK PHOS: 68 U/L (ref 38–126)
ALT: 17 U/L (ref 0–44)
ANION GAP: 12 (ref 5–15)
AST: 31 U/L (ref 15–41)
BUN: 27 mg/dL — ABNORMAL HIGH (ref 8–23)
CALCIUM: 9.4 mg/dL (ref 8.9–10.3)
CO2: 22 mmol/L (ref 22–32)
CREATININE: 1.2 mg/dL — AB (ref 0.44–1.00)
Chloride: 98 mmol/L (ref 98–111)
GFR calc Af Amer: 42 mL/min — ABNORMAL LOW (ref >60)
GFR calc non Af Amer: 36 mL/min — ABNORMAL LOW (ref >60)
GLUCOSE: 185 mg/dL — AB (ref 70–99)
Potassium: 4.4 mmol/L (ref 3.5–5.1)
SODIUM: 132 mmol/L — AB (ref 135–145)
Total Bilirubin: 1.2 mg/dL (ref 0.3–1.2)
Total Protein: 7.8 g/dL (ref 6.5–8.1)

## 2018-03-28 LAB — URINALYSIS, COMPLETE (UACMP) WITH MICROSCOPIC
Bacteria, UA: NONE SEEN
Bilirubin Urine: NEGATIVE
Glucose, UA: NEGATIVE mg/dL
Hgb urine dipstick: NEGATIVE
KETONES UR: NEGATIVE mg/dL
Leukocytes, UA: NEGATIVE
Nitrite: NEGATIVE
PH: 5 (ref 5.0–8.0)
PROTEIN: 30 mg/dL — AB
Specific Gravity, Urine: 1.026 (ref 1.005–1.030)

## 2018-03-28 LAB — CBC
HCT: 40.1 % (ref 35.0–47.0)
HEMOGLOBIN: 13.4 g/dL (ref 12.0–16.0)
MCH: 29.9 pg (ref 26.0–34.0)
MCHC: 33.4 g/dL (ref 32.0–36.0)
MCV: 89.5 fL (ref 80.0–100.0)
Platelets: 322 10*3/uL (ref 150–440)
RBC: 4.48 MIL/uL (ref 3.80–5.20)
RDW: 14.2 % (ref 11.5–14.5)
WBC: 15.7 10*3/uL — ABNORMAL HIGH (ref 3.6–11.0)

## 2018-03-28 LAB — LIPASE, BLOOD: Lipase: 29 U/L (ref 11–51)

## 2018-03-28 MED ORDER — IOHEXOL 300 MG/ML  SOLN
75.0000 mL | Freq: Once | INTRAMUSCULAR | Status: AC | PRN
  Administered 2018-03-28: 75 mL via INTRAVENOUS
  Filled 2018-03-28: qty 75

## 2018-03-28 MED ORDER — ONDANSETRON HCL 4 MG/2ML IJ SOLN
4.0000 mg | Freq: Four times a day (QID) | INTRAMUSCULAR | Status: DC | PRN
  Administered 2018-03-31 – 2018-04-01 (×2): 4 mg via INTRAVENOUS
  Filled 2018-03-28 (×2): qty 2

## 2018-03-28 MED ORDER — METOPROLOL SUCCINATE ER 25 MG PO TB24
25.0000 mg | ORAL_TABLET | Freq: Every day | ORAL | Status: DC
  Administered 2018-03-31 – 2018-04-01 (×2): 25 mg via ORAL
  Filled 2018-03-28 (×3): qty 1

## 2018-03-28 MED ORDER — ENOXAPARIN SODIUM 30 MG/0.3ML ~~LOC~~ SOLN
30.0000 mg | SUBCUTANEOUS | Status: DC
  Administered 2018-03-28 – 2018-03-29 (×2): 30 mg via SUBCUTANEOUS
  Filled 2018-03-28 (×2): qty 0.3

## 2018-03-28 MED ORDER — SODIUM CHLORIDE 0.9 % IV BOLUS
1000.0000 mL | Freq: Once | INTRAVENOUS | Status: AC
  Administered 2018-03-28: 1000 mL via INTRAVENOUS

## 2018-03-28 MED ORDER — ACETAMINOPHEN 325 MG PO TABS: 325.0000 mg | ORAL_TABLET | ORAL | Status: DC | PRN

## 2018-03-28 MED ORDER — ASPIRIN EC 325 MG PO TBEC
325.0000 mg | DELAYED_RELEASE_TABLET | Freq: Every day | ORAL | Status: DC
  Administered 2018-03-30 – 2018-04-01 (×3): 325 mg via ORAL
  Filled 2018-03-28 (×3): qty 1

## 2018-03-28 MED ORDER — ACETAMINOPHEN 650 MG RE SUPP: 650.0000 mg | Freq: Four times a day (QID) | RECTAL | Status: DC | PRN

## 2018-03-28 MED ORDER — HYDROCODONE-ACETAMINOPHEN 5-325 MG PO TABS
1.0000 | ORAL_TABLET | ORAL | Status: DC | PRN
  Administered 2018-03-29: 2 via ORAL
  Filled 2018-03-28: qty 2

## 2018-03-28 MED ORDER — FERROUS SULFATE 325 (65 FE) MG PO TABS
325.0000 mg | ORAL_TABLET | Freq: Every day | ORAL | Status: DC
  Administered 2018-03-30 – 2018-04-01 (×3): 325 mg via ORAL
  Filled 2018-03-28 (×3): qty 1

## 2018-03-28 MED ORDER — LOSARTAN POTASSIUM 50 MG PO TABS
50.0000 mg | ORAL_TABLET | Freq: Every day | ORAL | Status: DC
  Administered 2018-03-31 – 2018-04-01 (×2): 50 mg via ORAL
  Filled 2018-03-28 (×3): qty 1

## 2018-03-28 MED ORDER — DEXTROSE-NACL 5-0.9 % IV SOLN
INTRAVENOUS | Status: DC
  Administered 2018-03-28 – 2018-04-01 (×8): via INTRAVENOUS

## 2018-03-28 MED ORDER — SODIUM CHLORIDE 0.9 % IV SOLN
500.0000 mg | INTRAVENOUS | Status: DC
  Administered 2018-03-29 – 2018-03-30 (×3): 500 mg via INTRAVENOUS
  Filled 2018-03-28 (×5): qty 500

## 2018-03-28 MED ORDER — SODIUM CHLORIDE 0.9 % IV SOLN
Freq: Once | INTRAVENOUS | Status: AC
  Administered 2018-03-28: 17:00:00 via INTRAVENOUS

## 2018-03-28 MED ORDER — ALBUTEROL SULFATE (2.5 MG/3ML) 0.083% IN NEBU: 2.5000 mg | INHALATION_SOLUTION | RESPIRATORY_TRACT | Status: DC | PRN

## 2018-03-28 MED ORDER — FUROSEMIDE 20 MG PO TABS
20.0000 mg | ORAL_TABLET | Freq: Every day | ORAL | Status: DC
  Administered 2018-03-31 – 2018-04-01 (×2): 20 mg via ORAL
  Filled 2018-03-28 (×3): qty 1

## 2018-03-28 MED ORDER — ATORVASTATIN CALCIUM 20 MG PO TABS
20.0000 mg | ORAL_TABLET | Freq: Every day | ORAL | Status: DC
  Administered 2018-03-30: 20 mg via ORAL
  Filled 2018-03-28: qty 1

## 2018-03-28 MED ORDER — ONDANSETRON HCL 4 MG PO TABS: 4.0000 mg | ORAL_TABLET | Freq: Four times a day (QID) | ORAL | Status: DC | PRN

## 2018-03-28 MED ORDER — SODIUM CHLORIDE 0.9 % IV SOLN
1.0000 g | INTRAVENOUS | Status: AC
  Administered 2018-03-28 – 2018-03-30 (×3): 1 g via INTRAVENOUS
  Filled 2018-03-28 (×2): qty 1
  Filled 2018-03-28: qty 10

## 2018-03-28 MED ORDER — ACETAMINOPHEN 325 MG PO TABS: 650.0000 mg | ORAL_TABLET | Freq: Four times a day (QID) | ORAL | Status: DC | PRN

## 2018-03-28 NOTE — Progress Notes (Signed)
Anticoagulation monitoring(Lovenox):   84 yo female ordered Lovenox 40 mg Q24h  Filed Weights   04/23/2018 1147  Weight: 169 lb (76.7 kg)   BMI    Lab Results  Component Value Date   CREATININE 1.20 (H) 04/17/2018   CREATININE 0.89 07/07/2017   CREATININE 0.78 07/01/2017   Estimated Creatinine Clearance: 25.7 mL/min (A) (by C-G formula based on SCr of 1.2 mg/dL (H)). Hemoglobin & Hematocrit     Component Value Date/Time   HGB 13.4 04/16/2018 1152   HCT 40.1 04/24/2018 1152     Per Protocol for Patient with estCrcl < 30 ml/min and BMI < 40, will transition to Lovenox 30 mg Q24h.

## 2018-03-28 NOTE — ED Notes (Signed)
Admitting MD at bedside.

## 2018-03-28 NOTE — ED Triage Notes (Signed)
Pt to ED via POV for vomiting that started yesterday. Pt dtr states that pt was seen at [facility] urgent care yesterday for possible UTI and was given Keflex, wasn't sure if this was related. Pt denies abdominal pain. Pt has vomited 4 times in the last 24 hours. Pt denies diarrhea. Pt is in NAD at this time.

## 2018-03-28 NOTE — H&P (Signed)
 [name]    MR#:  [id number]  DATE OF BIRTH:  09-Sep-1917  DATE OF ADMISSION:    PRIMARY CARE PHYSICIAN: Idelle Crouch, MD   REQUESTING/REFERRING PHYSICIAN:   CHIEF COMPLAINT:   Chief Complaint  Patient presents with  . Emesis    HISTORY OF PRESENT ILLNESS: Sara Robertson  is a 84 y.o. female with a known history per below which includes recent urinary tract diagnosis-on Keflex, presenting with acute nausea/vomiting worsening of the last 24 hours, complaints of back pain, abdominal pressure, in the emergency room found to have small bowel obstruction with transition point in the pelvis/distal esophageal thickening concerning for esophagitis/questionable bilateral pneumonia, white count 15,000, creatinine 1.2, sodium 132, patient evaluated at the bedside, daughter present, patient is sleeping, family not interested in any aggressive intervention/no surgery, NG tube placed by ED provider with excellent decompression-over a liter of fluid removed thus far, patient is DNR per daughter/power of attorney, patient is now being admitted for acute small bowel obstruction, urinary tract infection.  PAST MEDICAL HISTORY:   Past Medical History:  Diagnosis Date  . Anemia   . Angina pectoris ([place])   . CAD (coronary artery disease)   . Cancer (HCC)    tongue  . Diverticulosis   . GERD (gastroesophageal reflux disease)   . H/O carcinoid syndrome   . Headache   . History of tinnitus   . Hyperlipemia   . Hypertension   . OSA (obstructive sleep apnea)   . Peripheral neuropathy   . Pulmonary HTN ([place])    with noctural hypoxia?  . SOB (shortness of breath)   . Stroke [facility]) 06/30/2017    PAST SURGICAL HISTORY:  Past Surgical History:  Procedure Laterality Date  . APPENDECTOMY    . BLADDER SUSPENSION    . CARDIAC CATHETERIZATION  2010  . CARPAL TUNNEL RELEASE Bilateral   . CHOLECYSTECTOMY    . LUMBAR  LAMINECTOMY    . TONSILLECTOMY    . VAGINAL HYSTERECTOMY      SOCIAL HISTORY:  Social History   Tobacco Use  . Smoking status: Never Smoker  . Smokeless tobacco: Never Used  Substance Use Topics  . Alcohol use: No    FAMILY HISTORY:  Family History  Problem Relation Age of Onset  . Cancer Mother   . Cancer Sister   . Cancer Brother     DRUG ALLERGIES:  Allergies  Allergen Reactions  . Celebrex [Celecoxib] Nausea And Vomiting  . Motrin [Ibuprofen] Nausea And Vomiting  . Nexium [Esomeprazole Magnesium] Nausea And Vomiting    REVIEW OF SYSTEMS: Poor historian, daughter at the bedside able to give some details  CONSTITUTIONAL: No fever, fatigue or weakness.  EYES: No blurred or double vision.  EARS, NOSE, AND THROAT: No tinnitus or ear pain.  RESPIRATORY: No cough, shortness of breath, wheezing or hemoptysis.  CARDIOVASCULAR: No chest pain, orthopnea, edema.  GASTROINTESTINAL: No nausea, vomiting, diarrhea or abdominal pain.  GENITOURINARY: No dysuria, hematuria.  ENDOCRINE: No polyuria, nocturia,  HEMATOLOGY: No anemia, easy bruising or bleeding SKIN: No rash or lesion. MUSCULOSKELETAL: No joint pain or arthritis.   NEUROLOGIC: No tingling, numbness, weakness.  PSYCHIATRY: No anxiety or depression.   MEDICATIONS AT HOME:  Prior to Admission medications   Medication Sig Start Date End Date Taking? Authorizing Provider  acetaminophen (TYLENOL) 325 MG tablet Take 1-2 tablets (325-650 mg total) by mouth every 4 (four) hours as needed for  mild pain. 07/08/17   LoveIvan Anchors, PA-C  aspirin 325 MG tablet Take 1 tablet (325 mg total) by mouth daily. 06/30/17   Geradine Girt, DO  atorvastatin (LIPITOR) 20 MG tablet Take 1 tablet (20 mg total) by mouth daily at 6 PM. 07/10/17   Love, Ivan Anchors, PA-C  cephALEXin (KEFLEX) 500 MG capsule Take 1 capsule (500 mg total) by mouth 2 (two) times daily for 7 days. 03/27/18 04/03/18  Marylene Land, NP  ferrous sulfate 325 (65 FE) MG  tablet Take 1 tablet (325 mg total) by mouth daily with breakfast. 07/10/17   Love, Ivan Anchors, PA-C  furosemide (LASIX) 40 MG tablet Take 0.5 tablets (20 mg total) by mouth daily. 07/10/17   Love, Ivan Anchors, PA-C  losartan (COZAAR) 50 MG tablet Take 1 tablet (50 mg total) by mouth daily. 07/10/17   Love, Ivan Anchors, PA-C  metoprolol succinate (TOPROL-XL) 25 MG 24 hr tablet Take 25 mg by mouth daily. 08/31/17 08/31/18  [provider]  Multiple Vitamins-Minerals (MULTIVITAMIN WITH MINERALS) tablet Take 1 tablet by mouth daily.    [provider]  nitrofurantoin (MACRODANTIN) 100 MG capsule Take 100 mg by mouth 2 (two) times daily.    [provider]  senna-docusate (SENOKOT-S) 8.6-50 MG tablet Take 1 tablet by mouth 2 (two) times daily. 07/09/17   Love, Ivan Anchors, PA-C  vitamin B-12 500 MCG tablet Take 1 tablet (500 mcg total) by mouth daily. 07/01/17   Geradine Girt, DO      PHYSICAL EXAMINATION:   VITAL SIGNS: Blood pressure 122/61, pulse 84, temperature (!) 97.4 F (36.3 C), temperature source Oral, resp. rate 16, height 5\' 3"  (1.6 m), weight 76.7 kg, SpO2 94 %.  GENERAL:  84 y.o.-year-old patient lying in the bed with no acute distress.  Frail-appearing EYES: Pupils equal, round, reactive to light and accommodation. No scleral icterus. Extraocular muscles intact.  HEENT: Head atraumatic, normocephalic. Oropharynx and nasopharynx clear.  NG tube in place NECK:  Supple, no jugular venous distention. No thyroid enlargement, no tenderness.  LUNGS: Normal breath sounds bilaterally, no wheezing, rales,rhonchi or crepitation. No use of accessory muscles of respiration.  CARDIOVASCULAR: S1, S2 normal. No murmurs, rubs, or gallops.  ABDOMEN: Soft, nontender, nondistended. Bowel sounds present. No organomegaly or mass.  EXTREMITIES: No pedal edema, cyanosis, or clubbing.  NEUROLOGIC: Cranial nerves II through XII are intact. MAES  PSYCHIATRIC: The patient is sleeping.  SKIN: No  obvious rash, lesion, or ulcer.   LABORATORY PANEL:   CBC Recent Labs  Lab 04/14/2018 1152  WBC 15.7*  HGB 13.4  HCT 40.1  PLT 322  MCV 89.5  MCH 29.9  MCHC 33.4  RDW 14.2   ------------------------------------------------------------------------------------------------------------------  Chemistries  Recent Labs  Lab 04/18/2018 1152  NA 132*  K 4.4  CL 98  CO2 22  GLUCOSE 185*  BUN 27*  CREATININE 1.20*  CALCIUM 9.4  AST 31  ALT 17  ALKPHOS 68  BILITOT 1.2   ------------------------------------------------------------------------------------------------------------------ estimated creatinine clearance is 25.7 mL/min (A) (by C-G formula based on SCr of 1.2 mg/dL (H)). ------------------------------------------------------------------------------------------------------------------ No results for input(s): TSH, T4TOTAL, T3FREE, THYROIDAB in the last 72 hours.  Invalid input(s): FREET3   Coagulation profile No results for input(s): INR, PROTIME in the last 168 hours. ------------------------------------------------------------------------------------------------------------------- No results for input(s): DDIMER in the last 72 hours. -------------------------------------------------------------------------------------------------------------------  Cardiac Enzymes No results for input(s): CKMB, TROPONINI, MYOGLOBIN in the last 168 hours.  Invalid input(s): CK ------------------------------------------------------------------------------------------------------------------ Invalid input(s): POCBNP  ---------------------------------------------------------------------------------------------------------------  Urinalysis    Component Value Date/Time   COLORURINE AMBER (A) 04/21/2018 1152   APPEARANCEUR CLOUDY (A) 04/25/2018 1152   LABSPEC 1.026 04/25/2018 1152   PHURINE 5.0 04/05/2018 1152   GLUCOSEU NEGATIVE 04/11/2018 1152   HGBUR NEGATIVE  1152    BILIRUBINUR NEGATIVE 04/16/2018 1152   KETONESUR NEGATIVE 04/23/2018 1152   PROTEINUR 30 (A) 04/21/2018 1152   NITRITE NEGATIVE 04/13/2018 1152   LEUKOCYTESUR NEGATIVE 04/23/2018 1152     RADIOLOGY: Ct Abdomen Pelvis W Contrast  Result Date: 04/24/2018 CLINICAL DATA:  Patient with vomiting. EXAM: CT ABDOMEN AND PELVIS WITH CONTRAST TECHNIQUE: Multidetector CT imaging of the abdomen and pelvis was performed using the standard protocol following bolus administration of intravenous contrast. CONTRAST:  52mL OMNIPAQUE IOHEXOL 300 MG/ML  SOLN COMPARISON:  PET-CT 03/24/2018 FINDINGS: Lower chest: Normal heart size. Trace pericardial effusion. Dependent atelectasis within the bilateral lower lobes. Mild wall thickening of the distal esophagus. Hepatobiliary: Liver is normal in size and contour. Intrahepatic and extrahepatic biliary ductal dilatation. Common bile duct measures up to 1.6 cm (image 28; series 2). Prior cholecystectomy. Pancreas: Unremarkable Spleen: Unremarkable. Indeterminate subcentimeter low-attenuation lesion within the spleen (image 64; series 5). Adrenals/Urinary Tract: Stable adrenal glands. Kidneys enhance symmetrically with contrast. 1.3 cm cyst partially exophytic interpolar region right kidney. Urinary bladder is decompressed. Stomach/Bowel: Sigmoid colonic diverticulosis without evidence for acute diverticulitis. Stomach is distended. Distal esophageal wall thickening. Proximal small bowel is dilated and fluid-filled measuring up to 3.5 cm. There is a sharp transition to decompressed small bowel within the pelvis (image 38; series 5). Distal small bowel is decompressed. No free intraperitoneal air. Vascular/Lymphatic: Normal caliber abdominal aorta. Peripheral calcified atherosclerotic plaque. No retroperitoneal lymphadenopathy. Reproductive: Status post hysterectomy. Other: None. Musculoskeletal: Lumbar spine degenerative changes. No aggressive or acute appearing osseous lesions.  IMPRESSION: 1. Stomach and proximal small bowel is dilated. There is focal transition to decompressed small bowel within the pelvis. Findings are concerning for small bowel obstruction. The small bowel distal to the transition point and colon is decompressed. 2. Circumferential wall thickening of the distal esophagus concerning for the possibility of esophagitis. 3. Intrahepatic and extrahepatic biliary ductal dilatation. Recommend correlation with LFTs. Prior cholecystectomy. 4. Patchy consolidative opacities within the left greater than right lower lobes which may represent atelectasis. Infection not excluded. Electronically Signed   By: Lovey Newcomer M.D.   On: 03/31/2018 15:49    EKG: Orders placed or performed during the hospital encounter of 06/28/17  . ED EKG  . ED EKG  . EKG 12-Lead  . EKG 12-Lead  . EKG    IMPRESSION AND PLAN: *Acute small bowel obstruction with transition point Daughter/patient not interested in any aggressive intervention/no surgery Admit to regular nursing for bed, NG tube for decompression, bowel rest, IV fluids for rehydration, adult pain protocol, general surgery for expert opinion, and continue close medical monitoring  *Acute CAP Pneumonia protocol, Rocephin/azithromycin for 5-day course, follow-up on cultures  *Acute urinary tract infection Discontinue Keflex, Rocephin for 3-day course  *Chronic GERD PPI twice daily  *Chronic hypoxic respiratory failure On oxygen at night-unknown amount per daughter, will start off with 2 L at night  *Chronic hypertension Stable Continue home regiment   All the records are reviewed and case discussed with ED provider. Management plans discussed with the patient, family and they are in agreement.  CODE STATUS:dnr Code Status History    Date Active Date Inactive Code Status Order ID Comments User Context   06/30/2017 1648 07/11/2017 1455  DNR [id number]  Flora Lipps Inpatient   06/30/2017 1648 06/30/2017  1648 DNR [id number]  Bary Leriche, PA-C Inpatient   06/28/2017 2017 06/30/2017 1643 DNR [id number]  Rise Patience, MD Inpatient   06/28/2017 1731 06/28/2017 2017 Full Code [id number]  Bonnell Public, MD Inpatient   06/28/2017 1731 06/28/2017 1731 Full Code [id number]  Bonnell Public, MD Inpatient    Questions for Most Recent Historical Code Status (Order [id number])    Question Answer Comment   In the event of cardiac or respiratory ARREST Do not call a "code blue"    In the event of cardiac or respiratory ARREST Do not perform Intubation, CPR, defibrillation or ACLS    In the event of cardiac or respiratory ARREST Use medication by any route, position, wound care, and other measures to relive pain and suffering. May use oxygen, suction and manual treatment of airway obstruction as needed for comfort.         Advance Directive Documentation     Most Recent Value  Type of Advance Directive  Living will, Out of facility DNR (pink MOST or yellow form)  Pre-existing out of facility DNR order (yellow form or pink MOST form)  -  "MOST" Form in Place?  -       TOTAL TIME TAKING CARE OF THIS PATIENT: 45 minutes.    Avel Peace Herta Hink M.D on 04/20/2018   Between 7am to 6pm - Pager - [phone]  After 6pm go to [email or web address] - password [email or web address] [facility]  Office  [phone]  CC: Primary care physician; Idelle Crouch, MD   Note: This dictation was prepared with Dragon dictation along with smaller phrase technology. Any transcriptional errors that result from this process are unintentional.

## 2018-03-28 NOTE — Progress Notes (Signed)
   04/24/2018 2000  Clinical Encounter Type  Visited With Patient and family together  Visit Type Initial;Spiritual support (OR from Nurse)  Referral From Nurse  Recommendations Follow-up as requested.  Spiritual Encounters  Spiritual Needs Prayer;Emotional  Stress Factors  Patient Stress Factors Health changes  Chaplain responded to an OR for prayer. Upon arrival, Chaplain met the patient and multiple family members, including daughter, son-in-law, grandchildren, and Designer, industrial/product. As requested, after conversation, Chaplain prayed for the patient and care team. Patient did not appear anxious or worried. The patient seemed pleased with the prayer and visit. Chaplain asked the family to page the on-call chaplain if they need further spiritual support.

## 2018-03-28 NOTE — Progress Notes (Signed)
Family Meeting Note  Advance Directive:yes  Today a meeting took place with the Patient, daughter  Patient is able to participate   The following clinical team members were present during this meeting:MD  The following were discussed:Patient's diagnosis: sbo, Patient's progosis: Unable to determine and Goals for treatment: DNR  Additional follow-up to be provided: prn  Time spent during discussion:20 minutes  Gorden Harms, MD

## 2018-03-28 NOTE — ED Provider Notes (Signed)
[facility] Emergency Department Provider Note  ____________________________________________   I have reviewed the triage vital signs and the nursing notes.   HISTORY  Chief Complaint Emesis   History limited by: Not Limited   HPI Sara Robertson is a 84 y.o. female who presents to the emergency department today because of concerns for nausea and vomiting.  Patient states that the symptoms started very shortly after she started Keflex.  She went to urgent care yesterday because of concerns for back pain and possible urinary tract infection.  She was started on Keflex.  She states almost immediately after trying to take her first dose she started developing nausea.  She has had roughly 4 episodes of vomiting since it began.  The patient has had some abdominal pressure.  She denies any fevers.   Per medical record review patient has a history of appendectomy, cholecystectomy, vaginal hysterectomy.   Past Medical History:  Diagnosis Date  . Anemia   . Angina pectoris ([place])   . CAD (coronary artery disease)   . Cancer (HCC)    tongue  . Diverticulosis   . GERD (gastroesophageal reflux disease)   . H/O carcinoid syndrome   . Headache   . History of tinnitus   . Hyperlipemia   . Hypertension   . OSA (obstructive sleep apnea)   . Peripheral neuropathy   . Pulmonary HTN ([place])    with noctural hypoxia?  . SOB (shortness of breath)   . Stroke [facility]) 06/30/2017    Patient Active Problem List   Diagnosis Date Noted  . Stroke (cerebrum) ([place]) 06/30/2017  . Cerebellar infarct ([place])   . Acute lower UTI   . PAF (paroxysmal atrial fibrillation) ([place])   . PAH (pulmonary artery hypertension) ([place])   . Benign essential HTN   . Diastolic dysfunction   . OSA (obstructive sleep apnea)   . Coronary artery disease involving native coronary artery of native heart without angina pectoris   . Acute blood loss anemia   . Acute CVA (cerebrovascular accident) ([place])  06/28/2017  . Iron deficiency anemia 06/29/2015    Past Surgical History:  Procedure Laterality Date  . APPENDECTOMY    . BLADDER SUSPENSION    . CARDIAC CATHETERIZATION  2010  . CARPAL TUNNEL RELEASE Bilateral   . CHOLECYSTECTOMY    . LUMBAR LAMINECTOMY    . TONSILLECTOMY    . VAGINAL HYSTERECTOMY      Prior to Admission medications   Medication Sig Start Date End Date Taking? Authorizing Provider  acetaminophen (TYLENOL) 325 MG tablet Take 1-2 tablets (325-650 mg total) by mouth every 4 (four) hours as needed for mild pain. 07/08/17   LoveIvan Anchors, PA-C  aspirin 325 MG tablet Take 1 tablet (325 mg total) by mouth daily. 06/30/17   Geradine Girt, DO  atorvastatin (LIPITOR) 20 MG tablet Take 1 tablet (20 mg total) by mouth daily at 6 PM. 07/10/17   Love, Ivan Anchors, PA-C  cephALEXin (KEFLEX) 500 MG capsule Take 1 capsule (500 mg total) by mouth 2 (two) times daily for 7 days. 03/27/18 04/03/18  Marylene Land, NP  ferrous sulfate 325 (65 FE) MG tablet Take 1 tablet (325 mg total) by mouth daily with breakfast. 07/10/17   Love, Ivan Anchors, PA-C  furosemide (LASIX) 40 MG tablet Take 0.5 tablets (20 mg total) by mouth daily. 07/10/17   Love, Ivan Anchors, PA-C  losartan (COZAAR) 50 MG tablet Take 1 tablet (50 mg total) by mouth daily.  07/10/17   Love, Ivan Anchors, PA-C  metoprolol succinate (TOPROL-XL) 25 MG 24 hr tablet Take 25 mg by mouth daily. 08/31/17 08/31/18  [provider]  Multiple Vitamins-Minerals (MULTIVITAMIN WITH MINERALS) tablet Take 1 tablet by mouth daily.    [provider]  nitrofurantoin (MACRODANTIN) 100 MG capsule Take 100 mg by mouth 2 (two) times daily.    [provider]  senna-docusate (SENOKOT-S) 8.6-50 MG tablet Take 1 tablet by mouth 2 (two) times daily. 07/09/17   Love, Ivan Anchors, PA-C  vitamin B-12 500 MCG tablet Take 1 tablet (500 mcg total) by mouth daily. 07/01/17   Geradine Girt, DO    Allergies Celebrex [celecoxib]; Motrin [ibuprofen];  and Nexium [esomeprazole magnesium]  Family History  Problem Relation Age of Onset  . Cancer Mother   . Cancer Sister   . Cancer Brother     Social History Social History   Tobacco Use  . Smoking status: Never Smoker  . Smokeless tobacco: Never Used  Substance Use Topics  . Alcohol use: No  . Drug use: No    Review of Systems Constitutional: No fever/chills Eyes: No visual changes. ENT: No sore throat. Cardiovascular: Denies chest pain. Respiratory: Denies shortness of breath. Gastrointestinal: Positive abdominal pressure, nausea and vomiting Genitourinary: Negative for dysuria. Musculoskeletal: Positive for back pain Skin: Negative for rash. Neurological: Negative for headaches, focal weakness or numbness.  ____________________________________________   PHYSICAL EXAM:  VITAL SIGNS: ED Triage Vitals  Enc Vitals Group     BP 04/19/2018 1150 122/61     Pulse Rate 04/17/2018 1150 84     Resp 04/23/2018 1150 16     Temp 04/03/2018 1150 (!) 97.4 F (36.3 C)     Temp Source 04/10/2018 1150 Oral     SpO2 04/09/2018 1150 94 %     Weight 04/22/2018 1147 169 lb (76.7 kg)     Height 04/19/2018 1147 5\' 3"  (1.6 m)     Head Circumference --      Peak Flow --    Constitutional: Alert and oriented.  Eyes: Conjunctivae are normal.  ENT      Head: Normocephalic and atraumatic.      Nose: No congestion/rhinnorhea.      Mouth/Throat: Mucous membranes are moist.      Neck: No stridor. Hematological/Lymphatic/Immunilogical: No cervical lymphadenopathy. Cardiovascular: Normal rate, regular rhythm.  No murmurs, rubs, or gallops. Respiratory: Normal respiratory effort without tachypnea nor retractions. Breath sounds are clear and equal bilaterally. No wheezes/rales/rhonchi. Gastrointestinal: Soft and minimally tender in the lower abdomen Genitourinary: Deferred Musculoskeletal: Normal range of motion in all extremities. No lower extremity edema. Neurologic:  Normal speech and language. No  gross focal neurologic deficits are appreciated.  Skin:  Skin is warm, dry and intact. No rash noted. Psychiatric: Mood and affect are normal. Speech and behavior are normal. Patient exhibits appropriate insight and judgment.  ____________________________________________    LABS (pertinent positives/negatives)  Lipase 29 CBC wbc 15.7, hgb 13.4, plt 322 CMP na 132, k 4.4, cr 1.20 UA cloudy, 0-5 RBC and WBC, 11-20 squamous ____________________________________________   EKG  None  ____________________________________________    RADIOLOGY  CT abd/pel Concern for SBO  ____________________________________________   PROCEDURES  Procedures  ____________________________________________   INITIAL IMPRESSION / ASSESSMENT AND PLAN / ED COURSE  Pertinent labs & imaging results that were available during my care of the patient were reviewed by me and considered in my medical decision making (see chart for details).   Patient presented  to the emergency department today because of concerns for nausea and vomiting.  Family thought it could be secondary to Keflex that the patient was started on yesterday for presumed UTI.  However given patient has had multiple abdominal surgeries I did have some concerns or obstruction.  CT scan was consistent with obstruction.  An NG tube was placed with large volume output.  Will plan on admission.  Discussed findings plan with patient family.  ____________________________________________   FINAL CLINICAL IMPRESSION(S) / ED DIAGNOSES  Final diagnoses:  SBO (small bowel obstruction) ([place])     Note: This dictation was prepared with Dragon dictation. Any transcriptional errors that result from this process are unintentional     Nance Pear, MD 04/26/2018 1814

## 2018-03-28 NOTE — Consult Note (Signed)
SURGICAL CONSULTATION NOTE   HISTORY OF PRESENT ILLNESS (HPI):  84 y.o. female presented to [facility] ED for evaluation of nausea and vomiting. Patient reports started with nausea and vomiting two nights ago. The patient was evaluated at the ED due to suspected UTI. U/A was not diagnostic but patient was treated empirically for UTI. After starting Keflex patient started with nausea and vomiting. No abdominal pain, no pian radiation. No improving or aggravator factor. Family member though it was a reaction from the antibiotic but since it persisted for more than 24 hours she was brought to the ER. Patent had CT scan done and was found with small bowel dilation suggestive of obstruction. NGT was placed and has drained around 2 liters since it was placed. The patient has previous history of hysterectomy and gallbladder surgery. Patient also with history of constipation, last bowel movement 6 days ago. Patient admitted by Hospitalist since patient and family member does not want surgical management.   Surgery is consulted by Dr. Archie Balboa in this context for evaluation and management of small bowel obstruction.  PAST MEDICAL HISTORY (PMH):  Past Medical History:  Diagnosis Date  . Anemia   . Angina pectoris ([place])   . CAD (coronary artery disease)   . Cancer (HCC)    tongue  . Diverticulosis   . GERD (gastroesophageal reflux disease)   . H/O carcinoid syndrome   . Headache   . History of tinnitus   . Hyperlipemia   . Hypertension   . OSA (obstructive sleep apnea)   . Peripheral neuropathy   . Pulmonary HTN ([place])    with noctural hypoxia?  . SOB (shortness of breath)   . Stroke [facility]) 06/30/2017     PAST SURGICAL HISTORY ([place]):  Past Surgical History:  Procedure Laterality Date  . APPENDECTOMY    . BLADDER SUSPENSION    . CARDIAC CATHETERIZATION  2010  . CARPAL TUNNEL RELEASE Bilateral   . CHOLECYSTECTOMY    . LUMBAR LAMINECTOMY    . TONSILLECTOMY    . VAGINAL HYSTERECTOMY        MEDICATIONS:  Prior to Admission medications   Medication Sig Start Date End Date Taking? Authorizing Provider  acetaminophen (TYLENOL) 325 MG tablet Take 1-2 tablets (325-650 mg total) by mouth every 4 (four) hours as needed for mild pain. 07/08/17   LoveIvan Anchors, PA-C  aspirin 325 MG tablet Take 1 tablet (325 mg total) by mouth daily. 06/30/17   Geradine Girt, DO  atorvastatin (LIPITOR) 20 MG tablet Take 1 tablet (20 mg total) by mouth daily at 6 PM. 07/10/17   Love, Ivan Anchors, PA-C  cephALEXin (KEFLEX) 500 MG capsule Take 1 capsule (500 mg total) by mouth 2 (two) times daily for 7 days. 03/27/18 04/03/18  Marylene Land, NP  ferrous sulfate 325 (65 FE) MG tablet Take 1 tablet (325 mg total) by mouth daily with breakfast. 07/10/17   Love, Ivan Anchors, PA-C  furosemide (LASIX) 40 MG tablet Take 0.5 tablets (20 mg total) by mouth daily. 07/10/17   Love, Ivan Anchors, PA-C  losartan (COZAAR) 50 MG tablet Take 1 tablet (50 mg total) by mouth daily. 07/10/17   Love, Ivan Anchors, PA-C  metoprolol succinate (TOPROL-XL) 25 MG 24 hr tablet Take 25 mg by mouth daily. 08/31/17 08/31/18  [provider]  Multiple Vitamins-Minerals (MULTIVITAMIN WITH MINERALS) tablet Take 1 tablet by mouth daily.    [provider]  nitrofurantoin (MACRODANTIN) 100 MG capsule Take 100 mg by mouth 2 (  two) times daily.    [provider]  senna-docusate (SENOKOT-S) 8.6-50 MG tablet Take 1 tablet by mouth 2 (two) times daily. 07/09/17   Love, Ivan Anchors, PA-C  vitamin B-12 500 MCG tablet Take 1 tablet (500 mcg total) by mouth daily. 07/01/17   Geradine Girt, DO     ALLERGIES:  Allergies  Allergen Reactions  . Celebrex [Celecoxib] Nausea And Vomiting  . Motrin [Ibuprofen] Nausea And Vomiting  . Nexium [Esomeprazole Magnesium] Nausea And Vomiting     SOCIAL HISTORY:  Social History   Socioeconomic History  . Marital status: Widowed    Spouse name: Not on file  . Number of children: Not on file  .  Years of education: Not on file  . Highest education level: Not on file  Occupational History  . Not on file  Social Needs  . Financial resource strain: Not on file  . Food insecurity:    Worry: Not on file    Inability: Not on file  . Transportation needs:    Medical: Not on file    Non-medical: Not on file  Tobacco Use  . Smoking status: Never Smoker  . Smokeless tobacco: Never Used  Substance and Sexual Activity  . Alcohol use: No  . Drug use: No  . Sexual activity: Not on file  Lifestyle  . Physical activity:    Days per week: Not on file    Minutes per session: Not on file  . Stress: Not on file  Relationships  . Social connections:    Talks on phone: Not on file    Gets together: Not on file    Attends religious service: Not on file    Active member of club or organization: Not on file    Attends meetings of clubs or organizations: Not on file    Relationship status: Not on file  . Intimate partner violence:    Fear of current or ex partner: Not on file    Emotionally abused: Not on file    Physically abused: Not on file    Forced sexual activity: Not on file  Other Topics Concern  . Not on file  Social History Narrative   Lives at [facility], independent living   Education- college   Children- 7   Caffeine- sweet tea, one glass daily     The patient currently resides (home / rehab facility / nursing home): Home The patient normally is (ambulatory / bedbound): Ambulatory   FAMILY HISTORY:  Family History  Problem Relation Age of Onset  . Cancer Mother   . Cancer Sister   . Cancer Brother      REVIEW OF SYSTEMS:  Constitutional: denies weight loss, fever, chills, or sweats  Eyes: denies any other vision changes, history of eye injury  ENT: denies sore throat, hearing problems  Respiratory: denies shortness of breath, wheezing  Cardiovascular: denies chest pain, palpitations  Gastrointestinal: denies abdominal pain, Positive for N/V, and  constipation.  Genitourinary: denies burning with urination or urinary frequency Musculoskeletal: denies any other joint pains or cramps  Skin: denies any other rashes or skin discolorations  Neurological: denies any other headache, dizziness, weakness  Psychiatric: denies any other depression, anxiety   All other review of systems were negative   VITAL SIGNS:  Temp:  [97.4 F (36.3 C)-98.4 F (36.9 C)] 98.4 F (36.9 C) (09/01 1859) Pulse Rate:  [79-84] 79 (09/01 1859) Resp:  [16-17] 17 (09/01 1859) BP: (122-147)/(61-70) 147/70 (09/01 1859)  SpO2:  [94 %-95 %] 95 % (09/01 1859) Weight:  [76.7 kg] 76.7 kg (09/01 1147)     Height: 5\' 3"  (160 cm) Weight: 76.7 kg BMI (Calculated): 29.94   INTAKE/OUTPUT:  This shift: No intake/output data recorded.  Last 2 shifts: @IOLAST2SHIFTS @   PHYSICAL EXAM:  Constitutional:  -- Normal body habitus  -- Awake, alert, and oriented x3  Eyes:  -- Pupils equally round and reactive to light  -- No scleral icterus  Ear, nose, and throat:  -- No jugular venous distension  Pulmonary:  -- No crackles  -- Equal breath sounds bilaterally -- Breathing non-labored at rest Cardiovascular:  -- S1, S2 present  -- No pericardial rubs Gastrointestinal:  -- Abdomen soft, nontender, non-distended, no guarding or rebound tenderness -- No abdominal masses appreciated, pulsatile or otherwise  Musculoskeletal and Integumentary:  -- Wounds or skin discoloration: None appreciated -- Extremities: B/L UE and LE FROM, hands and feet warm, no edema  Neurologic:  -- Motor function: intact and symmetric -- Sensation: intact and symmetric   Labs:  CBC Latest Ref Rng & Units 04/24/2018 07/07/2017 07/01/2017  WBC 3.6 - 11.0 K/uL 15.7(H) 8.1 6.3  Hemoglobin 12.0 - 16.0 g/dL 13.4 10.9(L) 11.5(L)  Hematocrit 35.0 - 47.0 % 40.1 34.0(L) 34.4(L)  Platelets 150 - 440 K/uL 322 275 226   CMP Latest Ref Rng & Units 04/03/2018 07/07/2017 07/01/2017  Glucose 70 - 99 mg/dL 185(H)  104(H) 113(H)  BUN 8 - 23 mg/dL 27(H) 15 10  Creatinine 0.44 - 1.00 mg/dL 1.20(H) 0.89 0.78  Sodium 135 - 145 mmol/L 132(L) 136 138  Potassium 3.5 - 5.1 mmol/L 4.4 4.1 3.8  Chloride 98 - 111 mmol/L 98 102 105  CO2 22 - 32 mmol/L 22 29 25   Calcium 8.9 - 10.3 mg/dL 9.4 8.7(L) 8.8(L)  Total Protein 6.5 - 8.1 g/dL 7.8 - 5.8(L)  Total Bilirubin 0.3 - 1.2 mg/dL 1.2 - 0.6  Alkaline Phos 38 - 126 U/L 68 - 55  AST 15 - 41 U/L 31 - 21  ALT 0 - 44 U/L 17 - 17    Imaging studies:  EXAM: CT ABDOMEN AND PELVIS WITH CONTRAST  TECHNIQUE: Multidetector CT imaging of the abdomen and pelvis was performed using the standard protocol following bolus administration of intravenous contrast.  CONTRAST:  24mL OMNIPAQUE IOHEXOL 300 MG/ML  SOLN  COMPARISON:  PET-CT 03/24/2018  FINDINGS: Lower chest: Normal heart size. Trace pericardial effusion. Dependent atelectasis within the bilateral lower lobes. Mild wall thickening of the distal esophagus.  Hepatobiliary: Liver is normal in size and contour. Intrahepatic and extrahepatic biliary ductal dilatation. Common bile duct measures up to 1.6 cm (image 28; series 2). Prior cholecystectomy.  Pancreas: Unremarkable  Spleen: Unremarkable. Indeterminate subcentimeter low-attenuation lesion within the spleen (image 64; series 5).  Adrenals/Urinary Tract: Stable adrenal glands. Kidneys enhance symmetrically with contrast. 1.3 cm cyst partially exophytic interpolar region right kidney. Urinary bladder is decompressed.  Stomach/Bowel: Sigmoid colonic diverticulosis without evidence for acute diverticulitis. Stomach is distended. Distal esophageal wall thickening. Proximal small bowel is dilated and fluid-filled measuring up to 3.5 cm. There is a sharp transition to decompressed small bowel within the pelvis (image 38; series 5). Distal small bowel is decompressed. No free intraperitoneal air.  Vascular/Lymphatic: Normal caliber abdominal  aorta. Peripheral calcified atherosclerotic plaque. No retroperitoneal lymphadenopathy.  Reproductive: Status post hysterectomy.  Other: None.  Musculoskeletal: Lumbar spine degenerative changes. No aggressive or acute appearing osseous lesions.  IMPRESSION: 1. Stomach and proximal small  bowel is dilated. There is focal transition to decompressed small bowel within the pelvis. Findings are concerning for small bowel obstruction. The small bowel distal to the transition point and colon is decompressed. 2. Circumferential wall thickening of the distal esophagus concerning for the possibility of esophagitis. 3. Intrahepatic and extrahepatic biliary ductal dilatation. Recommend correlation with LFTs. Prior cholecystectomy. 4. Patchy consolidative opacities within the left greater than right lower lobes which may represent atelectasis. Infection not excluded.   Electronically Signed   By: Lovey Newcomer M.D.   On: 04/08/2018 15:49   Assessment/Plan:  84 y.o. female with small bowel obstruction, complicated by pertinent comorbidities including Pneumonia, UTI, HTN, Coronary artery disease, Tongue Cancer, Stroke, Pulmonary hypertention.  Patient with history, physical exam and images consistent with small bowel obstruction. Most likely from adhesion from previous surgery. Also may be exacerbated by the finding of pneumonia and UTI. Agree with current treatment for pneumonia, UTI hydration and NGT placement.   Large conversation with family and patient about diagnosis of small bowel obstruction and the plan to treat with conservative management. Even though they do not want surgical management, Il will be following patient to treat in a conservative way and help team with the management of small bowel obstruction. I will order tomorrow a Gastrografin challenge that will help to follow contrast with abdominal xray and also help to accelerate resolution of obstruction and guidance of  management.   Encourage patient and family to help patient to get out of bed, ambulate and stay active despite having the NGT.   All of the above findings and recommendations were discussed with the patient and her family, and all of patient's and her family's questions were answered to their expressed satisfaction.  Arnold Long, MD

## 2018-03-29 ENCOUNTER — Inpatient Hospital Stay: Payer: Medicare Other

## 2018-03-29 LAB — BASIC METABOLIC PANEL
Anion gap: 6 (ref 5–15)
BUN: 28 mg/dL — AB (ref 8–23)
CO2: 24 mmol/L (ref 22–32)
CREATININE: 0.99 mg/dL (ref 0.44–1.00)
Calcium: 8.6 mg/dL — ABNORMAL LOW (ref 8.9–10.3)
Chloride: 105 mmol/L (ref 98–111)
GFR, EST AFRICAN AMERICAN: 53 mL/min — AB (ref >60)
GFR, EST NON AFRICAN AMERICAN: 46 mL/min — AB (ref >60)
Glucose, Bld: 128 mg/dL — ABNORMAL HIGH (ref 70–99)
POTASSIUM: 4.4 mmol/L (ref 3.5–5.1)
SODIUM: 135 mmol/L (ref 135–145)

## 2018-03-29 LAB — CBC
HCT: 33.1 % — ABNORMAL LOW (ref 35.0–47.0)
Hemoglobin: 11.1 g/dL — ABNORMAL LOW (ref 12.0–16.0)
MCH: 30.3 pg (ref 26.0–34.0)
MCHC: 33.6 g/dL (ref 32.0–36.0)
MCV: 90.3 fL (ref 80.0–100.0)
PLATELETS: 250 10*3/uL (ref 150–440)
RBC: 3.66 MIL/uL — ABNORMAL LOW (ref 3.80–5.20)
RDW: 14.3 % (ref 11.5–14.5)
WBC: 12.7 10*3/uL — ABNORMAL HIGH (ref 3.6–11.0)

## 2018-03-29 LAB — URINE CULTURE

## 2018-03-29 MED ORDER — PHENOL 1.4 % MT LIQD
1.0000 | OROMUCOSAL | Status: DC | PRN
  Filled 2018-03-29: qty 177

## 2018-03-29 MED ORDER — SODIUM CHLORIDE 0.9 % IV SOLN: INTRAVENOUS | Status: DC

## 2018-03-29 MED ORDER — DIATRIZOATE MEGLUMINE & SODIUM 66-10 % PO SOLN
90.0000 mL | Freq: Once | ORAL | Status: AC
  Administered 2018-03-29: 90 mL via NASOGASTRIC

## 2018-03-29 NOTE — Progress Notes (Addendum)
[employer] [facility] [place] NAME: Sara Robertson    MR#:  [id number]  DATE OF BIRTH:  1918-02-09  SUBJECTIVE:  CHIEF COMPLAINT:   Chief Complaint  Patient presents with  . Emesis   The patient has abdominal pain and distention, on NGT suction. REVIEW OF SYSTEMS:  Review of Systems  Constitutional: Positive for malaise/fatigue. Negative for chills and fever.  HENT: Negative for sore throat.   Eyes: Negative for blurred vision and double vision.  Respiratory: Negative for cough, hemoptysis, shortness of breath, wheezing and stridor.   Cardiovascular: Negative for chest pain, palpitations, orthopnea and leg swelling.  Gastrointestinal: Negative for abdominal pain, blood in stool, diarrhea, melena, nausea and vomiting.  Genitourinary: Negative for dysuria, flank pain and hematuria.  Musculoskeletal: Negative for back pain and joint pain.  Skin: Negative for rash.  Neurological: Negative for dizziness, sensory change, focal weakness, seizures, loss of consciousness, weakness and headaches.  Endo/Heme/Allergies: Negative for polydipsia.  Psychiatric/Behavioral: Negative for depression. The patient is not nervous/anxious.     DRUG ALLERGIES:   Allergies  Allergen Reactions  . Celebrex [Celecoxib] Nausea And Vomiting  . Motrin [Ibuprofen] Nausea And Vomiting  . Nexium [Esomeprazole Magnesium] Nausea And Vomiting   VITALS:  Blood pressure (!) 158/69, pulse 66, temperature 98.5 F (36.9 C), temperature source Oral, resp. rate 16, height 5\' 3"  (1.6 m), weight 75.1 kg, SpO2 100 %. PHYSICAL EXAMINATION:  Physical Exam  Constitutional: She is oriented to person, place, and time. No distress.  HENT:  Head: Normocephalic.  Eyes: Pupils are equal, round, and reactive to light. Conjunctivae and EOM are normal. No scleral icterus.  Neck: Normal range of motion. Neck supple. No JVD present. No tracheal deviation present.  Cardiovascular: Normal rate,  regular rhythm and normal heart sounds. Exam reveals no gallop.  No murmur heard. Pulmonary/Chest: Effort normal and breath sounds normal. No respiratory distress. She has no wheezes. She has no rales.  Abdominal: Soft. Bowel sounds are normal. She exhibits distension. There is tenderness. There is no rebound.  Musculoskeletal: Normal range of motion. She exhibits no edema or tenderness.  Neurological: She is alert and oriented to person, place, and time. No cranial nerve deficit.  Skin: No rash noted. No erythema.  Psychiatric: She has a normal mood and affect.   LABORATORY PANEL:  Female CBC Recent Labs  Lab 03/29/18 0352  WBC 12.7*  HGB 11.1*  HCT 33.1*  PLT 250   ------------------------------------------------------------------------------------------------------------------ Chemistries  Recent Labs  Lab 04/18/2018 1152 03/29/18 0352  NA 132* 135  K 4.4 4.4  CL 98 105  CO2 22 24  GLUCOSE 185* 128*  BUN 27* 28*  CREATININE 1.20* 0.99  CALCIUM 9.4 8.6*  AST 31  --   ALT 17  --   ALKPHOS 68  --   BILITOT 1.2  --    RADIOLOGY:  Ct Abdomen Pelvis W Contrast  Result Date: 04/24/2018 CLINICAL DATA:  Patient with vomiting. EXAM: CT ABDOMEN AND PELVIS WITH CONTRAST TECHNIQUE: Multidetector CT imaging of the abdomen and pelvis was performed using the standard protocol following bolus administration of intravenous contrast. CONTRAST:  48mL OMNIPAQUE IOHEXOL 300 MG/ML  SOLN COMPARISON:  PET-CT 03/24/2018 FINDINGS: Lower chest: Normal heart size. Trace pericardial effusion. Dependent atelectasis within the bilateral lower lobes. Mild wall thickening of the distal esophagus. Hepatobiliary: Liver is normal in size and contour. Intrahepatic and extrahepatic biliary ductal dilatation. Common bile duct measures up to 1.6 cm (image 28;  series 2). Prior cholecystectomy. Pancreas: Unremarkable Spleen: Unremarkable. Indeterminate subcentimeter low-attenuation lesion within the spleen (image 64;  series 5). Adrenals/Urinary Tract: Stable adrenal glands. Kidneys enhance symmetrically with contrast. 1.3 cm cyst partially exophytic interpolar region right kidney. Urinary bladder is decompressed. Stomach/Bowel: Sigmoid colonic diverticulosis without evidence for acute diverticulitis. Stomach is distended. Distal esophageal wall thickening. Proximal small bowel is dilated and fluid-filled measuring up to 3.5 cm. There is a sharp transition to decompressed small bowel within the pelvis (image 38; series 5). Distal small bowel is decompressed. No free intraperitoneal air. Vascular/Lymphatic: Normal caliber abdominal aorta. Peripheral calcified atherosclerotic plaque. No retroperitoneal lymphadenopathy. Reproductive: Status post hysterectomy. Other: None. Musculoskeletal: Lumbar spine degenerative changes. No aggressive or acute appearing osseous lesions. IMPRESSION: 1. Stomach and proximal small bowel is dilated. There is focal transition to decompressed small bowel within the pelvis. Findings are concerning for small bowel obstruction. The small bowel distal to the transition point and colon is decompressed. 2. Circumferential wall thickening of the distal esophagus concerning for the possibility of esophagitis. 3. Intrahepatic and extrahepatic biliary ductal dilatation. Recommend correlation with LFTs. Prior cholecystectomy. 4. Patchy consolidative opacities within the left greater than right lower lobes which may represent atelectasis. Infection not excluded. Electronically Signed   By: Lovey Newcomer M.D.   On: 04/04/2018 15:49   Dg Abd Portable 1v-small Bowel Protocol-position Verification  Result Date: 03/29/2018 CLINICAL DATA:  84 year old female status post nasogastric tube placement EXAM: PORTABLE ABDOMEN - 1 VIEW COMPARISON:  CT abdomen/pelvis 04/16/2018 FINDINGS: The tip of the gastric tube projects over the stomach in good position. Linear airspace opacities in the left lung base consistent with  atelectasis. Decreased gastric distention. Multilevel degenerative disc disease and dextroconvex scoliosis of the lumbar spine. No acute osseous abnormality. IMPRESSION: The tip of the gastric tube projects over the stomach. Left basilar atelectasis. Electronically Signed   By: Jacqulynn Cadet M.D.   On: 03/29/2018 09:27   ASSESSMENT AND PLAN:   *Acute small bowel obstruction with transition point Daughter/patient not interested in any aggressive intervention/no surgery Continue NG tube for decompression, bowel rest, IV fluids for rehydration, pain protocol. Gastrografin challenge today per Dr. Fanny Skates.  Dehydration.  Continue IV fluid support and follow-up BMP.  *Acute CAP with cytosis. Pneumonia protocol, Rocephin/azithromycin for 5-day course, follow-up CBC and cultures  *No urinary tract infection I don't see UA showed UTI, WBC 0-5, Nitrite is negative.  *Chronic GERD PPI twice daily  *Chronic hypoxic respiratory failure On oxygen at night-unknown amount per daughter, on  O2 [place] 2 L at night  *Chronic hypertension Stable Continue home regiment  All the records are reviewed and case discussed with Care Management/Social Worker. Management plans discussed with the patient, her daughter and they are in agreement.  CODE STATUS: DNR  TOTAL TIME TAKING CARE OF THIS PATIENT: 36 minutes.   More than 50% of the time was spent in counseling/coordination of care: YES  POSSIBLE D/C IN 2-3 DAYS, DEPENDING ON CLINICAL CONDITION.   Demetrios Loll M.D on 03/29/2018 at 1:47 PM  Between 7am to 6pm - Pager - [phone]  After 6pm go to [email or web address] - [facility]

## 2018-03-29 NOTE — Clinical Social Work Note (Signed)
CSW consulted for patient being from [facility]. This is to considered a facility due to it being independent living. CSW will follow in the event that CSW needs arise. Shela Leff MSW,LCSW [phone]

## 2018-03-29 NOTE — Plan of Care (Signed)

## 2018-03-29 NOTE — Progress Notes (Signed)
Initial Nutrition Assessment  DOCUMENTATION CODES:   Not applicable  INTERVENTION:  Once diet able to be advanced recommend providing Ensure Enlive po BID, each supplement provides 350 kcal and 20 grams of protein.  NUTRITION DIAGNOSIS:   Inadequate oral intake related to decreased appetite(taste changes following stroke 06/2017) as evidenced by per patient/family report.  GOAL:   Patient will meet greater than or equal to 90% of their needs  MONITOR:   PO intake, Supplement acceptance, Diet advancement, Labs, Weight trends, I & O's  REASON FOR ASSESSMENT:   Malnutrition Screening Tool    ASSESSMENT:   84 year old female with PMHx of HTN, HLD, CAD s/p cardiac catheterization in 2010, GERD, OSA, diverticulosis, peripheral neuropathy, CVA 06/30/2017, tongue cancer who is now admitted with small bowel obstruction most likely from adhesions. Patient and daughter have opted for conservative management and do not want any surgical intervention.   -Plan is for Gastrografin challenge today.  Met with patient and her daughter at bedside. Patient reports that on the evening of 8/30 she and her daughter went out to eat to celebrate that her PET scan did not show any mets and she had a fairly big meal. She then had oatmeal for breakfast on 8/31, which was her last meal. She developed N/V on 8/31 and presented to ED. Currently has NGT in place with about 150 mL output in cannister at time of RD assessment. Patient and daughter feel output is slowing down. Per chart she had 2600 mL output from NGT yesterday. No flatus or BM yet. Last BM was a small one on morning of 8/31. Patient reports she has had a decreased appetite since her stroke in 06/2017. She has had taste changes and does not enjoy food as much anymore. She typically skips breakfast but occasionally will have oatmeal brought to her room at the independent living facility. She goes down to eat lunch and dinner in the dining room but  usually has a small meal such as a salad plate or cottage cheese and fruit. She is still very independent but has been feeling weaker lately. She reports she was recently diagnosed with tongue cancer, but no treatment plan has been decided on yet. Patient is amenable to drinking Ensure with diet advancement.  Prior UBW for most of adult life was 200 lbs, but recently patient had gotten down to about 180 lbs prior to stroke. She has lost weight since. Per chart she was 83.8 kg (184.36 lbs on 09/14/2017. She is currently 75.1 kg (165.6 lbs). She has lost 18.76 lbs (10.2% body weight) over the past 7 months, which is not quite significant for time frame.  Medications reviewed and include: ferrous sulfate 325 mg daily (held d/t NPO status), Lasix 20 mg daily (held d/t NPO status), azithromycin, ceftriaxone, D5-NS at 75 mL/hr (90 grams dextrose, 306 kcal daily).  Labs reviewed: BUN 28.  Patient does not meet criteria for malnutrition at this time.  Discussed with RN.  NUTRITION - FOCUSED PHYSICAL EXAM:    Most Recent Value  Orbital Region  No depletion  Upper Arm Region  No depletion  Thoracic and Lumbar Region  No depletion  Buccal Region  No depletion  Temple Region  Mild depletion  Clavicle Bone Region  No depletion  Clavicle and Acromion Bone Region  No depletion  Scapular Bone Region  No depletion  Dorsal Hand  No depletion  Patellar Region  No depletion  Anterior Thigh Region  No depletion  Posterior Calf  Region  No depletion  Edema (RD Assessment)  None  Hair  Reviewed  Eyes  Reviewed  Mouth  Reviewed  Skin  Reviewed  Nails  Reviewed     Diet Order:   Diet Order            Diet NPO time specified Except for: Sips with Meds  Diet effective now              EDUCATION NEEDS:   No education needs have been identified at this time  Skin:  Skin Assessment: Skin Integrity Issues:(MSAD left buttocks)  Last BM:  PTA  Height:   Ht Readings from Last 1 Encounters:   03/31/2018 '5\' 3"'$  (1.6 m)    Weight:   Wt Readings from Last 1 Encounters:  03/29/18 75.1 kg    Ideal Body Weight:  52.3 kg  BMI:  Body mass index is 29.33 kg/m.  Estimated Nutritional Needs:   Kcal:  1440-1660 (MSJ x 1.3-1.5)  Protein:  75-90 grams (1-1.2 grams/kg)  Fluid:  1.4-1.7 L/day  Willey Blade, MS, RD, LDN Office: [phone] Pager: [phone] After Hours/Weekend Pager: [phone]

## 2018-03-29 NOTE — Progress Notes (Signed)
[facility] Day(s): 1.   Post op day(s):  Marland Kitchen   Interval History: Patient seen and examined, no acute events or new complaints overnight. Patient reports feeling OK, resting comfortable at bed.  Vital signs in last 24 hours: [min-max] current  Temp:  [98.1 F (36.7 C)-98.5 F (36.9 C)] 98.5 F (36.9 C) (09/02 1249) Pulse Rate:  [66-79] 66 (09/02 1249) Resp:  [16-24] 16 (09/02 1249) BP: (141-158)/(62-70) 158/69 (09/02 1249) SpO2:  [95 %-100 %] 100 % (09/02 1249) Weight:  [75.1 kg] 75.1 kg (09/02 0458)     Height: 5\' 3"  (160 cm) Weight: 75.1 kg BMI (Calculated): 29.34   Intake/Output this shift:  NGT: 2600 since placement  Physical Exam:  Constitutional: alert, cooperative and no distress  Respiratory: breathing non-labored at rest  Cardiovascular: regular rate and sinus rhythm  Gastrointestinal: soft, non-tender, and non-distended  Labs:  CBC Latest Ref Rng & Units 03/29/2018 04/04/2018 07/07/2017  WBC 3.6 - 11.0 K/uL 12.7(H) 15.7(H) 8.1  Hemoglobin 12.0 - 16.0 g/dL 11.1(L) 13.4 10.9(L)  Hematocrit 35.0 - 47.0 % 33.1(L) 40.1 34.0(L)  Platelets 150 - 440 K/uL 250 322 275   CMP Latest Ref Rng & Units 03/29/2018 04/11/2018 07/07/2017  Glucose 70 - 99 mg/dL 128(H) 185(H) 104(H)  BUN 8 - 23 mg/dL 28(H) 27(H) 15  Creatinine 0.44 - 1.00 mg/dL 0.99 1.20(H) 0.89  Sodium 135 - 145 mmol/L 135 132(L) 136  Potassium 3.5 - 5.1 mmol/L 4.4 4.4 4.1  Chloride 98 - 111 mmol/L 105 98 102  CO2 22 - 32 mmol/L 24 22 29   Calcium 8.9 - 10.3 mg/dL 8.6(L) 9.4 8.7(L)  Total Protein 6.5 - 8.1 g/dL - 7.8 -  Total Bilirubin 0.3 - 1.2 mg/dL - 1.2 -  Alkaline Phos 38 - 126 U/L - 68 -  AST 15 - 41 U/L - 31 -  ALT 0 - 44 U/L - 17 -    Imaging studies: No new pertinent imaging studies   Assessment/Plan:  84 y.o. female with small bowel obstruction, complicated by pertinent comorbidities including Pneumonia, UTI, HTN, Coronary artery disease, Tongue Cancer, Stroke, Pulmonary  hypertention. Patient with significant NGT output since placement. Not passing gas. Will order gastrografin challenge. Continue NPO and IV hydration. Agree with current medical management.   Arnold Long, MD

## 2018-03-30 LAB — BASIC METABOLIC PANEL
ANION GAP: 5 (ref 5–15)
BUN: 26 mg/dL — ABNORMAL HIGH (ref 8–23)
CALCIUM: 8.7 mg/dL — AB (ref 8.9–10.3)
CO2: 25 mmol/L (ref 22–32)
Chloride: 109 mmol/L (ref 98–111)
Creatinine, Ser: 0.9 mg/dL (ref 0.44–1.00)
GFR calc Af Amer: 60 mL/min — ABNORMAL LOW (ref >60)
GFR calc non Af Amer: 52 mL/min — ABNORMAL LOW (ref >60)
Glucose, Bld: 112 mg/dL — ABNORMAL HIGH (ref 70–99)
POTASSIUM: 3.8 mmol/L (ref 3.5–5.1)
Sodium: 139 mmol/L (ref 135–145)

## 2018-03-30 LAB — CBC
HCT: 32.8 % — ABNORMAL LOW (ref 35.0–47.0)
Hemoglobin: 11.1 g/dL — ABNORMAL LOW (ref 12.0–16.0)
MCH: 30.5 pg (ref 26.0–34.0)
MCHC: 33.9 g/dL (ref 32.0–36.0)
MCV: 90 fL (ref 80.0–100.0)
Platelets: 264 10*3/uL (ref 150–440)
RBC: 3.64 MIL/uL — ABNORMAL LOW (ref 3.80–5.20)
RDW: 14.6 % — ABNORMAL HIGH (ref 11.5–14.5)
WBC: 9.2 10*3/uL (ref 3.6–11.0)

## 2018-03-30 LAB — MAGNESIUM: Magnesium: 2 mg/dL (ref 1.7–2.4)

## 2018-03-30 MED ORDER — ENOXAPARIN SODIUM 40 MG/0.4ML ~~LOC~~ SOLN
40.0000 mg | SUBCUTANEOUS | Status: DC
  Administered 2018-03-30 – 2018-03-31 (×2): 40 mg via SUBCUTANEOUS
  Filled 2018-03-30 (×2): qty 0.4

## 2018-03-30 NOTE — Progress Notes (Signed)
[facility] [place] NAME: Sara Robertson    MR#:  [id number]  DATE OF BIRTH:  October 04, 1917  SUBJECTIVE:  CHIEF COMPLAINT:   Chief Complaint  Patient presents with  . Emesis   The patient has no abdominal pain, nausea or vomiting, on NGT suction. REVIEW OF SYSTEMS:  Review of Systems  Constitutional: Negative for chills, fever and malaise/fatigue.  HENT: Negative for sore throat.   Eyes: Negative for blurred vision and double vision.  Respiratory: Negative for cough, hemoptysis, shortness of breath, wheezing and stridor.   Cardiovascular: Negative for chest pain, palpitations, orthopnea and leg swelling.  Gastrointestinal: Negative for abdominal pain, blood in stool, diarrhea, melena, nausea and vomiting.  Genitourinary: Negative for dysuria, flank pain and hematuria.  Musculoskeletal: Negative for back pain and joint pain.  Skin: Negative for rash.  Neurological: Negative for dizziness, sensory change, focal weakness, seizures, loss of consciousness, weakness and headaches.  Endo/Heme/Allergies: Negative for polydipsia.  Psychiatric/Behavioral: Negative for depression. The patient is not nervous/anxious.     DRUG ALLERGIES:   Allergies  Allergen Reactions  . Celebrex [Celecoxib] Nausea And Vomiting  . Motrin [Ibuprofen] Nausea And Vomiting  . Nexium [Esomeprazole Magnesium] Nausea And Vomiting   VITALS:  Blood pressure (!) 121/59, pulse 75, temperature 97.9 F (36.6 C), temperature source Oral, resp. rate 16, height 5\' 3"  (1.6 m), weight 72.6 kg, SpO2 97 %. PHYSICAL EXAMINATION:  Physical Exam  Constitutional: She is oriented to person, place, and time. No distress.  HENT:  Head: Normocephalic.  Eyes: Pupils are equal, round, and reactive to light. Conjunctivae and EOM are normal. No scleral icterus.  Neck: Normal range of motion. Neck supple. No JVD present. No tracheal deviation present.  Cardiovascular: Normal rate,  regular rhythm and normal heart sounds. Exam reveals no gallop.  No murmur heard. Pulmonary/Chest: Effort normal and breath sounds normal. No respiratory distress. She has no wheezes. She has no rales.  Abdominal: Soft. Bowel sounds are normal. She exhibits distension. There is tenderness. There is no rebound.  Musculoskeletal: Normal range of motion. She exhibits no edema or tenderness.  Neurological: She is alert and oriented to person, place, and time. No cranial nerve deficit.  Skin: No rash noted. No erythema.  Psychiatric: She has a normal mood and affect.   LABORATORY PANEL:  Female CBC Recent Labs  Lab 03/30/18 0514  WBC 9.2  HGB 11.1*  HCT 32.8*  PLT 264   ------------------------------------------------------------------------------------------------------------------ Chemistries  Recent Labs  Lab 04/18/2018 1152  03/30/18 0514  NA 132*   < > 139  K 4.4   < > 3.8  CL 98   < > 109  CO2 22   < > 25  GLUCOSE 185*   < > 112*  BUN 27*   < > 26*  CREATININE 1.20*   < > 0.90  CALCIUM 9.4   < > 8.7*  MG  --   --  2.0  AST 31  --   --   ALT 17  --   --   ALKPHOS 68  --   --   BILITOT 1.2  --   --    < > = values in this interval not displayed.   RADIOLOGY:  Dg Abd Portable 1v-small Bowel Obstruction Protocol-initial, 8 Hr Delay  Result Date: 03/29/2018 CLINICAL DATA:  84 year old female with history of small-bowel obstruction. EXAM: PORTABLE ABDOMEN - 1 VIEW COMPARISON:  None. FINDINGS: Enteric tube with tip  in the body of the stomach. Gas and stool are seen scattered throughout the colon extending to the level of the distal rectum. No pathologic distension of small bowel is noted. No gross evidence of pneumoperitoneum. Iodinated contrast material in the urinary bladder, presumably residual from yesterday's CT the abdomen and pelvis. IMPRESSION: 1. Nonobstructive bowel gas pattern. 2. No pneumoperitoneum. Electronically Signed   By: Vinnie Langton M.D.   On: 03/29/2018 18:21    ASSESSMENT AND PLAN:   *Acute small bowel obstruction with transition point Daughter/patient not interested in any aggressive intervention/no surgery She is on NG tube for decompression, bowel rest, IV fluids for rehydration, pain protocol. DG abd xray: Nonobstructive bowel gas pattern. Discontinue NG tube, start clear liquid diet.  Dehydration.  Continue IV fluid support and follow-up BMP.  *Acute CAP with leukocytosis. Pneumonia protocol, Rocephin/azithromycin for 5-day course, follow-up blood culture.  Leukocytosis improved.  *No urinary tract infection I don't see UA showed UTI, WBC 0-5, Nitrite is negative.  *Chronic GERD PPI twice daily  *Chronic hypoxic respiratory failure On oxygen at night-unknown amount per daughter, on  O2 [place] 2 L at night  *Chronic hypertension Stable Continue home regiment  All the records are reviewed and case discussed with Care Management/Social Worker. Management plans discussed with the patient, her daughter and they are in agreement.  CODE STATUS: DNR  TOTAL TIME TAKING CARE OF THIS PATIENT: 32 minutes.   More than 50% of the time was spent in counseling/coordination of care: YES  POSSIBLE D/C IN 1-2 DAYS, DEPENDING ON CLINICAL CONDITION.   Demetrios Loll M.D on 03/30/2018 at 12:48 PM  Between 7am to 6pm - Pager - [phone]  After 6pm go to [email or web address] - [place] [facility]

## 2018-03-30 NOTE — Care Management Note (Signed)
Case Management Note  Patient Details  Name: Sara Robertson MRN: [id number] Date of Birth: [birth year]  Subjective/Objective:    Admitted to [facility] with possible small bowel obstruction.  Resident of [facility].                  Action/Plan: Received referral for home health needs. Physical therapy evaluation completed No follow-up needs per physical therpy.   Expected Discharge Date:                  Expected Discharge Plan:     In-House Referral:   yes  Discharge planning Services   yes  Post Acute Care Choice:    Choice offered to:     DME Arranged:    DME Agency:     HH Arranged:   No  HH Agency:   None needed  Status of Service:   completed  If discussed at Long Length of Stay Meetings, dates discussed:    Additional Comments:  Shelbie Ammons, RN MSN CCM Care Management [phone] 03/30/2018, 10:26 AM

## 2018-03-30 NOTE — Evaluation (Signed)
Physical Therapy Evaluation Patient Details Name: Sara Robertson MRN: [id number] DOB: 1918/05/30 Today's Date: 03/30/2018   History of Present Illness  presented to ER secondary to nausea/vomiting, emesis, back and abdominal pain; admitted with SBO, possible esophagitis and bilat PNA.  Managing medically (with NGT for decompression, discontinued 9/3 AM) as patient/family desire no aggressive intervention at this time.  Clinical Impression  Upon evaluation, patient alert and oriented; eager for OOB activities as tolerated.  Bilat UE/LE strength and ROM grossly symmetrical and WFL; no focal weakness appreciated.  Does report mild baseline soreness to R knee (chronic arthritic changes); unchanged with this admission.  Able to complete bed mobility with mod indep; sit/stand, basic transfers and gait (100') with 4WRW, cga/close sup.  Mild forward trunk flexion with decreased step height/length and overall cadence; however, good control with no overt LOB noted.  Patient voices that performance feels at/near baseline for her, but she wishes to continue mobility training to avoid strength loss during hospitalization. Will maintain on caseload throughout remaining hospitalization to maintain strength/functional endurance; anticipate no formal PT needs upon discharge.    Follow Up Recommendations No PT follow up    Equipment Recommendations       Recommendations for Other Services       Precautions / Restrictions Precautions Precautions: Fall Restrictions Weight Bearing Restrictions: No      Mobility  Bed Mobility Overal bed mobility: Modified Independent                Transfers Overall transfer level: Needs assistance Equipment used: 4-wheeled walker Transfers: Sit to/from Stand Sit to Stand: Min guard;Supervision            Ambulation/Gait Ambulation/Gait assistance: Min Gaffer (Feet): 100 Feet Assistive device: 4-wheeled walker       General  Gait Details: forward head, mildly flexed trunk posture; decreased cadence and overall gait speed, but no overt buckling or LOB.  Patient with good awareness of signs/symptoms of fatigue, need for activity pacing and energy conservation (self-initiates rest periods and activity termination when appropriate).  Stairs            Wheelchair Mobility    Modified Rankin (Stroke Patients Only)       Balance Overall balance assessment: Needs assistance Sitting-balance support: No upper extremity supported;Feet supported Sitting balance-Leahy Scale: Good     Standing balance support: Bilateral upper extremity supported Standing balance-Leahy Scale: Fair                               Pertinent Vitals/Pain Pain Assessment: No/denies pain    Home Living Family/patient expects to be discharged to:: ([facility])               Home Equipment: Walker - 4 wheels Additional Comments: Elevator access to 2nd floor living space    Prior Function Level of Independence: Independent with assistive device(s)         Comments: Ambulatory with 4WRW for distances within facility; ambulates to/from dining hall ("several hundred feet") for all three meals; daughter assists with ADLs as needed when visiting.  Denies fall history.     Hand Dominance   Dominant Hand: Right    Extremity/Trunk Assessment   Upper Extremity Assessment Upper Extremity Assessment: Overall WFL for tasks assessed    Lower Extremity Assessment Lower Extremity Assessment: Overall WFL for tasks assessed(grossly at least 4/5 throughout; mild R knee weakness/pain due to chronic  arthiritic changes)       Communication   Communication: (intermittent word-finding difficulties?)  Cognition Arousal/Alertness: Awake/alert Behavior During Therapy: WFL for tasks assessed/performed Overall Cognitive Status: Within Functional Limits for tasks assessed                                         General Comments      Exercises     Assessment/Plan    PT Assessment Patient needs continued PT services  PT Problem List Decreased activity tolerance;Decreased balance;Decreased mobility       PT Treatment Interventions DME instruction;Gait training;Functional mobility training;Therapeutic activities;Balance training;Therapeutic exercise;Patient/family education    PT Goals (Current goals can be found in the Care Plan section)  Acute Rehab PT Goals Patient Stated Goal: to not lose any strength while I'm here PT Goal Formulation: With patient Time For Goal Achievement: 04/13/18 Potential to Achieve Goals: Good    Frequency Min 2X/week   Barriers to discharge Decreased caregiver support      Co-evaluation               AM-PAC PT "6 Clicks" Daily Activity  Outcome Measure Difficulty turning over in bed (including adjusting bedclothes, sheets and blankets)?: None Difficulty moving from lying on back to sitting on the side of the bed? : None Difficulty sitting down on and standing up from a chair with arms (e.g., wheelchair, bedside commode, etc,.)?: Unable Help needed moving to and from a bed to chair (including a wheelchair)?: A Little Help needed walking in hospital room?: A Little Help needed climbing 3-5 steps with a railing? : A Little 6 Click Score: 18    End of Session Equipment Utilized During Treatment: Gait belt Activity Tolerance: Patient tolerated treatment well Patient left: in chair;with call bell/phone within reach;with chair alarm set Nurse Communication: Mobility status PT Visit Diagnosis: Muscle weakness (generalized) (M62.81);Difficulty in walking, not elsewhere classified (R26.2)    Time: 6222-9798 PT Time Calculation (min) (ACUTE ONLY): 14 min   Charges:   PT Evaluation $PT Eval Low Complexity: 1 Low          Deryl Ports H. Owens Shark, PT, DPT, NCS 03/30/18, 10:23 AM [phone]

## 2018-03-30 NOTE — Progress Notes (Signed)
Pharmacist - Prescriber Communication  Enoxaparin dose modified from 30 mg subcutaneously once daily to 40 mg subcutaneously once daily due to CrCl > 30 mL/min and ABW > 45 kg.  Vaishnavi Dalby A. [place], [place].D., BCPS Clinical Pharmacist 03/30/18 08:11

## 2018-03-30 NOTE — Progress Notes (Signed)
[facility] Day(s): 2.   Post op day(s):  Marland Kitchen   Interval History: Patient seen and examined, no acute events or new complaints overnight. Patient reports feeling good, denies nausea or vomitng.  Vital signs in last 24 hours: [min-max] current  Temp:  [97.8 F (36.6 C)-97.9 F (36.6 C)] 97.9 F (36.6 C) (09/03 1251) Pulse Rate:  [64-77] 66 (09/03 1251) Resp:  [16-18] 16 (09/03 1019) BP: (121-141)/(54-59) 141/58 (09/03 1251) SpO2:  [89 %-100 %] 89 % (09/03 1251) Weight:  [72.6 kg] 72.6 kg (09/02 2044)     Height: 5\' 3"  (160 cm) Weight: 72.6 kg BMI (Calculated): 28.36   Physical Exam:  Constitutional: alert, cooperative and no distress  Respiratory: breathing non-labored at rest  Cardiovascular: regular rate and sinus rhythm  Gastrointestinal: soft, non-tender, and non-distended  Labs:  CBC Latest Ref Rng & Units 03/30/2018 03/29/2018 04/09/2018  WBC 3.6 - 11.0 K/uL 9.2 12.7(H) 15.7(H)  Hemoglobin 12.0 - 16.0 g/dL 11.1(L) 11.1(L) 13.4  Hematocrit 35.0 - 47.0 % 32.8(L) 33.1(L) 40.1  Platelets 150 - 440 K/uL 264 250 322   CMP Latest Ref Rng & Units 03/30/2018 03/29/2018 04/16/2018  Glucose 70 - 99 mg/dL 112(H) 128(H) 185(H)  BUN 8 - 23 mg/dL 26(H) 28(H) 27(H)  Creatinine 0.44 - 1.00 mg/dL 0.90 0.99 1.20(H)  Sodium 135 - 145 mmol/L 139 135 132(L)  Potassium 3.5 - 5.1 mmol/L 3.8 4.4 4.4  Chloride 98 - 111 mmol/L 109 105 98  CO2 22 - 32 mmol/L 25 24 22   Calcium 8.9 - 10.3 mg/dL 8.7(L) 8.6(L) 9.4  Total Protein 6.5 - 8.1 g/dL - - 7.8  Total Bilirubin 0.3 - 1.2 mg/dL - - 1.2  Alkaline Phos 38 - 126 U/L - - 68  AST 15 - 41 U/L - - 31  ALT 0 - 44 U/L - - 17    Imaging studies: Abdominal xray personally evaluated and found with no small bowel dilation, Gas large intestine. No contrast seen.    Assessment/Plan:  84 y.o.femalewith small bowel obstruction, complicated by pertinent comorbidities includingPneumonia, UTI, HTN, Coronary artery disease, Tongue Cancer,  Stroke, Pulmonary hypertention. NGT removed by Hospitalist. Patient tolerating without nausea or vomiting. Will follow in case of deterioration.   Arnold Long, MD

## 2018-03-31 ENCOUNTER — Inpatient Hospital Stay: Payer: Medicare Other

## 2018-03-31 MED ORDER — POLYETHYLENE GLYCOL 3350 17 G PO PACK
17.0000 g | PACK | Freq: Every day | ORAL | Status: DC
  Administered 2018-03-31 – 2018-04-01 (×2): 17 g via ORAL
  Filled 2018-03-31 (×2): qty 1

## 2018-03-31 MED ORDER — DOCUSATE SODIUM 100 MG PO CAPS
200.0000 mg | ORAL_CAPSULE | Freq: Two times a day (BID) | ORAL | Status: DC
  Administered 2018-03-31 – 2018-04-01 (×2): 200 mg via ORAL
  Filled 2018-03-31 (×3): qty 2

## 2018-03-31 MED ORDER — POLYETHYLENE GLYCOL 3350 17 GM/SCOOP PO POWD
1.0000 | Freq: Once | ORAL | Status: DC
  Filled 2018-03-31: qty 255

## 2018-03-31 NOTE — Care Management Important Message (Signed)
Important Message  Patient Details  Name: Sara Robertson MRN: [id number] Date of Birth: 1917-10-19   Medicare Important Message Given:  Yes    Juliann Pulse A Darrall Strey 03/31/2018, 10:34 AM

## 2018-03-31 NOTE — Progress Notes (Signed)
[place] at [place] NAME: Sara Robertson    MR#:  [id number]  DATE OF BIRTH:  1917-08-27  SUBJECTIVE:  CHIEF COMPLAINT:   Chief Complaint  Patient presents with  . Emesis  Patient is feeling much better, family at the bedside, discontinue all antibiotics, Colace twice daily, MiraLAX x1 now, advance to full liquid diet  REVIEW OF SYSTEMS:  CONSTITUTIONAL: No fever, fatigue or weakness.  EYES: No blurred or double vision.  EARS, NOSE, AND THROAT: No tinnitus or ear pain.  RESPIRATORY: No cough, shortness of breath, wheezing or hemoptysis.  CARDIOVASCULAR: No chest pain, orthopnea, edema.  GASTROINTESTINAL: No nausea, vomiting, diarrhea or abdominal pain.  GENITOURINARY: No dysuria, hematuria.  ENDOCRINE: No polyuria, nocturia,  HEMATOLOGY: No anemia, easy bruising or bleeding SKIN: No rash or lesion. MUSCULOSKELETAL: No joint pain or arthritis.   NEUROLOGIC: No tingling, numbness, weakness.  PSYCHIATRY: No anxiety or depression.   ROS  DRUG ALLERGIES:   Allergies  Allergen Reactions  . Celebrex [Celecoxib] Nausea And Vomiting  . Motrin [Ibuprofen] Nausea And Vomiting  . Nexium [Esomeprazole Magnesium] Nausea And Vomiting    VITALS:  Blood pressure (!) 171/82, pulse 67, temperature 98.4 F (36.9 C), temperature source Oral, resp. rate 20, height 5\' 3"  (1.6 m), weight 72.6 kg, SpO2 99 %.  PHYSICAL EXAMINATION:  GENERAL:  84 y.o.-year-old patient lying in the bed with no acute distress.  EYES: Pupils equal, round, reactive to light and accommodation. No scleral icterus. Extraocular muscles intact.  HEENT: Head atraumatic, normocephalic. Oropharynx and nasopharynx clear.  NECK:  Supple, no jugular venous distention. No thyroid enlargement, no tenderness.  LUNGS: Normal breath sounds bilaterally, no wheezing, rales,rhonchi or crepitation. No use of accessory muscles of respiration.  CARDIOVASCULAR: S1, S2 normal. No murmurs, rubs,  or gallops.  ABDOMEN: Soft, nontender, nondistended. Bowel sounds present. No organomegaly or mass.  EXTREMITIES: No pedal edema, cyanosis, or clubbing.  NEUROLOGIC: Cranial nerves II through XII are intact. Muscle strength 5/5 in all extremities. Sensation intact. Gait not checked.  PSYCHIATRIC: The patient is alert and oriented x 3.  SKIN: No obvious rash, lesion, or ulcer.   Physical Exam LABORATORY PANEL:   CBC Recent Labs  Lab 03/30/18 0514  WBC 9.2  HGB 11.1*  HCT 32.8*  PLT 264   ------------------------------------------------------------------------------------------------------------------  Chemistries  Recent Labs  Lab 04/09/2018 1152  03/30/18 0514  NA 132*   < > 139  K 4.4   < > 3.8  CL 98   < > 109  CO2 22   < > 25  GLUCOSE 185*   < > 112*  BUN 27*   < > 26*  CREATININE 1.20*   < > 0.90  CALCIUM 9.4   < > 8.7*  MG  --   --  2.0  AST 31  --   --   ALT 17  --   --   ALKPHOS 68  --   --   BILITOT 1.2  --   --    < > = values in this interval not displayed.   ------------------------------------------------------------------------------------------------------------------  Cardiac Enzymes No results for input(s): TROPONINI in the last 168 hours. ------------------------------------------------------------------------------------------------------------------  RADIOLOGY:  Dg Abd Portable 1v-small Bowel Obstruction Protocol-initial, 8 Hr Delay  Result Date: 03/29/2018 CLINICAL DATA:  84 year old female with history of small-bowel obstruction. EXAM: PORTABLE ABDOMEN - 1 VIEW COMPARISON:  None. FINDINGS: Enteric tube with tip in the body of the stomach. Gas and stool  are seen scattered throughout the colon extending to the level of the distal rectum. No pathologic distension of small bowel is noted. No gross evidence of pneumoperitoneum. Iodinated contrast material in the urinary bladder, presumably residual from yesterday's CT the abdomen and pelvis. IMPRESSION:  1. Nonobstructive bowel gas pattern. 2. No pneumoperitoneum. Electronically Signed   By: Vinnie Langton M.D.   On: 03/29/2018 18:21    ASSESSMENT AND PLAN:  *Acute small bowel obstruction with transition point Resolved Daughter/patient not interested in any aggressive intervention/no surgery Treated with conservative therapy, general surgery input appreciated, advance to full liquid diet  *Dehydration Resolved with IV fluids for rehydration  *AcuteCAP with leukocytosis. Resolved  Treated with empiric course of Rocephin/azithromycin   *Chronic GERD Stable PPI twice daily  *Chronic hypoxic respiratory failure Stable On oxygen at night only,  O2 [place] 2 L at night  *Chronic hypertension Stable Continue home regiment  All the records are reviewed and case discussed with Care Management/Social Workerr. Management plans discussed with the patient, family and they are in agreement.  CODE STATUS: dnr  TOTAL TIME TAKING CARE OF THIS PATIENT: 91minutes.     POSSIBLE D/C IN 1 DAYS, DEPENDING ON CLINICAL CONDITION.   Avel Peace Salary M.D on 03/31/2018   Between 7am to 6pm - Pager - [phone]  After 6pm go to [email or web address] - password [email or web address] [place] [facility]  Office  [phone]  CC: Primary care physician; Idelle Crouch, MD  Note: This dictation was prepared with Dragon dictation along with smaller phrase technology. Any transcriptional errors that result from this process are unintentional.

## 2018-03-31 NOTE — Progress Notes (Signed)
[facility] Hospital Day(s): 3.   Post op day(s):  Marland Kitchen   Interval History: Patient seen and examined, no acute events or new complaints overnight. Patient reports feeling well. Refers does not know if she passed gas. Denies nausea and vomiting.  Vital signs in last 24 hours: [min-max] current  Temp:  [97.8 F (36.6 C)-98.6 F (37 C)] 98.6 F (37 C) (09/04 1345) Pulse Rate:  [54-79] 54 (09/04 1345) Resp:  [18-20] 18 (09/04 1345) BP: (145-191)/(52-82) 148/52 (09/04 1345) SpO2:  [98 %-100 %] 100 % (09/04 1345)     Height: 5\' 3"  (160 cm) Weight: 72.6 kg BMI (Calculated): 28.36   Physical Exam:  Constitutional: alert, cooperative and no distress  Respiratory: breathing non-labored at rest  Cardiovascular: regular rate and sinus rhythm  Gastrointestinal: soft, non-tender, and non-distended  Labs:  CBC Latest Ref Rng & Units 03/30/2018 03/29/2018 04/07/2018  WBC 3.6 - 11.0 K/uL 9.2 12.7(H) 15.7(H)  Hemoglobin 12.0 - 16.0 g/dL 11.1(L) 11.1(L) 13.4  Hematocrit 35.0 - 47.0 % 32.8(L) 33.1(L) 40.1  Platelets 150 - 440 K/uL 264 250 322   CMP Latest Ref Rng & Units 03/30/2018 03/29/2018 04/22/2018  Glucose 70 - 99 mg/dL 112(H) 128(H) 185(H)  BUN 8 - 23 mg/dL 26(H) 28(H) 27(H)  Creatinine 0.44 - 1.00 mg/dL 0.90 0.99 1.20(H)  Sodium 135 - 145 mmol/L 139 135 132(L)  Potassium 3.5 - 5.1 mmol/L 3.8 4.4 4.4  Chloride 98 - 111 mmol/L 109 105 98  CO2 22 - 32 mmol/L 25 24 22   Calcium 8.9 - 10.3 mg/dL 8.7(L) 8.6(L) 9.4  Total Protein 6.5 - 8.1 g/dL - - 7.8  Total Bilirubin 0.3 - 1.2 mg/dL - - 1.2  Alkaline Phos 38 - 126 U/L - - 68  AST 15 - 41 U/L - - 31  ALT 0 - 44 U/L - - 17    Imaging studies: No new pertinent imaging studies   Assessment/Plan:  84 y.o.femalewith small bowel obstruction, complicated by pertinent comorbidities includingPneumonia, UTI, HTN, Coronary artery disease, Tongue Cancer, Stroke, Pulmonary hypertention. Patient tolerated clear liquid without nausea and  vomiting. Unknown if patient is passing flatus. No nausea. Hospitalist service progressed diet. Will follow to assess if there is any deterioration.   Arnold Long, MD

## 2018-04-01 ENCOUNTER — Inpatient Hospital Stay: Payer: Medicare Other

## 2018-04-01 LAB — CBC WITH DIFFERENTIAL/PLATELET
Basophils Absolute: 0.1 10*3/uL (ref 0–0.1)
Basophils Relative: 1 %
Eosinophils Absolute: 0.3 10*3/uL (ref 0–0.7)
Eosinophils Relative: 3 %
HCT: 31 % — ABNORMAL LOW (ref 35.0–47.0)
Hemoglobin: 10.5 g/dL — ABNORMAL LOW (ref 12.0–16.0)
Lymphocytes Relative: 9 %
Lymphs Abs: 0.9 10*3/uL — ABNORMAL LOW (ref 1.0–3.6)
MCH: 30.5 pg (ref 26.0–34.0)
MCHC: 34 g/dL (ref 32.0–36.0)
MCV: 89.8 fL (ref 80.0–100.0)
MONO ABS: 1.2 10*3/uL — AB (ref 0.2–0.9)
MONOS PCT: 11 %
Neutro Abs: 7.8 10*3/uL — ABNORMAL HIGH (ref 1.4–6.5)
Neutrophils Relative %: 76 %
Platelets: 287 10*3/uL (ref 150–440)
RBC: 3.45 MIL/uL — ABNORMAL LOW (ref 3.80–5.20)
RDW: 14.3 % (ref 11.5–14.5)
WBC: 10.2 10*3/uL (ref 3.6–11.0)

## 2018-04-01 LAB — BASIC METABOLIC PANEL
Anion gap: 5 (ref 5–15)
BUN: 12 mg/dL (ref 8–23)
CO2: 23 mmol/L (ref 22–32)
CREATININE: 0.7 mg/dL (ref 0.44–1.00)
Calcium: 8.7 mg/dL — ABNORMAL LOW (ref 8.9–10.3)
Chloride: 110 mmol/L (ref 98–111)
GFR calc Af Amer: >60 mL/min (ref >60)
GFR calc non Af Amer: >60 mL/min (ref >60)
GLUCOSE: 121 mg/dL — AB (ref 70–99)
Potassium: 4.1 mmol/L (ref 3.5–5.1)
Sodium: 138 mmol/L (ref 135–145)

## 2018-04-01 MED ORDER — ASPIRIN EC 81 MG PO TBEC: 81.0000 mg | DELAYED_RELEASE_TABLET | Freq: Every day | ORAL | Status: DC

## 2018-04-01 MED ORDER — APIXABAN 2.5 MG PO TABS: 2.5000 mg | ORAL_TABLET | Freq: Two times a day (BID) | ORAL | Status: DC

## 2018-04-01 MED ORDER — GUAIFENESIN ER 600 MG PO TB12
600.0000 mg | ORAL_TABLET | Freq: Two times a day (BID) | ORAL | Status: DC
  Administered 2018-04-01: 600 mg via ORAL
  Filled 2018-04-01: qty 1

## 2018-04-01 MED ORDER — DIGOXIN 125 MCG PO TABS: 0.1250 mg | ORAL_TABLET | Freq: Every day | ORAL | Status: DC

## 2018-04-01 MED ORDER — ADULT MULTIVITAMIN W/MINERALS CH
1.0000 | ORAL_TABLET | Freq: Every day | ORAL | Status: DC
  Administered 2018-04-01: 1 via ORAL
  Filled 2018-04-01: qty 1

## 2018-04-01 MED ORDER — BOOST / RESOURCE BREEZE PO LIQD CUSTOM: 1.0000 | Freq: Three times a day (TID) | ORAL | Status: DC

## 2018-04-02 LAB — CULTURE, BLOOD (ROUTINE X 2)
Culture: NO GROWTH
Culture: NO GROWTH

## 2018-04-27 NOTE — Progress Notes (Signed)
Patient ID: Sara Robertson, female   DOB: Feb 14, 1918, 84 y.o.   MRN: [id number] Patient pronounced dead at 2:36 pm Son in law at bedside Pt was DNR  Death summary to be done by Dr Clyda Hurdle

## 2018-04-27 NOTE — Progress Notes (Signed)
PT Cancellation Note  Patient Details Name: Sara Robertson MRN: [id number] DOB: 06-02-1918   Cancelled Treatment:    Reason Eval/Treat Not Completed: (Treatment session attempted.  Patient politely declined due to nausea, general malaise.  Will re-attempt at later time/date as medically appropriate and patient agreeable.  Encouraged OOB to chair with staff later this date; patient voiced understanding.)   Joee Iovine H. Owens Shark, PT, DPT, NCS 04-09-18, 11:57 AM [phone]

## 2018-04-27 NOTE — Progress Notes (Signed)
Pt continues to have nausea gave zofran and will continue to monitor.

## 2018-04-27 NOTE — Progress Notes (Signed)
Chaplain responded to Rapid Response/Code and provided spiritual and emotional support for the patient, son-in-law, and staff.  Chaplain prayed for the patient, offering a prayer for the dying. Also, Chaplain provided emotional support to two staff members.

## 2018-04-27 NOTE — Progress Notes (Signed)
Sara Robertson at [place] NAME: Sara Robertson    MR#:  [id number]  DATE OF BIRTH:  [birth year]  SUBJECTIVE:  CHIEF COMPLAINT:   Chief Complaint  Patient presents with  . Emesis  Large episode of emesis on yesterday, nausea today, denies abdominal pain, complains of cough, for acute abdominal series, general surgery to reevaluate  REVIEW OF SYSTEMS:  CONSTITUTIONAL: No fever, fatigue or weakness.  EYES: No blurred or double vision.  EARS, NOSE, AND THROAT: No tinnitus or ear pain.  RESPIRATORY: No cough, shortness of breath, wheezing or hemoptysis.  CARDIOVASCULAR: No chest pain, orthopnea, edema.  GASTROINTESTINAL: No nausea, vomiting, diarrhea or abdominal pain.  GENITOURINARY: No dysuria, hematuria.  ENDOCRINE: No polyuria, nocturia,  HEMATOLOGY: No anemia, easy bruising or bleeding SKIN: No rash or lesion. MUSCULOSKELETAL: No joint pain or arthritis.   NEUROLOGIC: No tingling, numbness, weakness.  PSYCHIATRY: No anxiety or depression.   ROS  DRUG ALLERGIES:   Allergies  Allergen Reactions  . Celebrex [Celecoxib] Nausea And Vomiting  . Motrin [Ibuprofen] Nausea And Vomiting  . Nexium [Esomeprazole Magnesium] Nausea And Vomiting    VITALS:  Blood pressure 130/60, pulse 67, temperature 97.8 F (36.6 C), temperature source Oral, resp. rate 18, height 5\' 3"  (1.6 m), weight 77.7 kg, SpO2 96 %.  PHYSICAL EXAMINATION:  GENERAL:  84 y.o.-year-old patient lying in the bed with no acute distress.  EYES: Pupils equal, round, reactive to light and accommodation. No scleral icterus. Extraocular muscles intact.  HEENT: Head atraumatic, normocephalic. Oropharynx and nasopharynx clear.  NECK:  Supple, no jugular venous distention. No thyroid enlargement, no tenderness.  LUNGS: Normal breath sounds bilaterally, no wheezing, rales,rhonchi or crepitation. No use of accessory muscles of respiration.  CARDIOVASCULAR: S1, S2 normal. No murmurs, rubs,  or gallops.  ABDOMEN: Soft, nontender, nondistended. Bowel sounds present. No organomegaly or mass.  EXTREMITIES: No pedal edema, cyanosis, or clubbing.  NEUROLOGIC: Cranial nerves II through XII are intact. Muscle strength 5/5 in all extremities. Sensation intact. Gait not checked.  PSYCHIATRIC: The patient is alert and oriented x 3.  SKIN: No obvious rash, lesion, or ulcer.   Physical Exam LABORATORY PANEL:   CBC Recent Labs  Lab 2018-04-08 0906  WBC 10.2  HGB 10.5*  HCT 31.0*  PLT 287   ------------------------------------------------------------------------------------------------------------------  Chemistries  Recent Labs  Lab 03/31/2018 1152  03/30/18 0514 04/08/18 0906  NA 132*   < > 139 138  K 4.4   < > 3.8 4.1  CL 98   < > 109 110  CO2 22   < > 25 23  GLUCOSE 185*   < > 112* 121*  BUN 27*   < > 26* 12  CREATININE 1.20*   < > 0.90 0.70  CALCIUM 9.4   < > 8.7* 8.7*  MG  --   --  2.0  --   AST 31  --   --   --   ALT 17  --   --   --   ALKPHOS 68  --   --   --   BILITOT 1.2  --   --   --    < > = values in this interval not displayed.   ------------------------------------------------------------------------------------------------------------------  Cardiac Enzymes No results for input(s): TROPONINI in the last 168 hours. ------------------------------------------------------------------------------------------------------------------  RADIOLOGY:  Dg Abd 2 Views  Result Date: 03/31/2018 CLINICAL DATA:  84 year old female with recent small bowel obstruction. EXAM: ABDOMEN - 2 VIEW COMPARISON:  KUB 03/29/2018. CT Abdomen and Pelvis 03/30/2018 and earlier. FINDINGS: Upright and supine views of the abdomen and pelvis. Nonobstructed bowel-gas pattern with some improvement since 03/29/2018. There are no longer mildly dilated CT contrast build small bowel loops in the mid abdomen. The amount of large bowel gas is stable and similar to the 04/19/2018 study. Enteric tube  has been removed. No pneumoperitoneum. Stable abdominal visceral contours. Stable hypo ventilation at the left lung base. Stable visualized osseous structures. IMPRESSION: 1. Enteric tube removed. 2. Further improved bowel gas pattern since 03/29/2018 compatible with resolving small bowel obstruction. The Electronically Signed   By: Genevie Ann M.D.   On: 03/31/2018 16:30    ASSESSMENT AND PLAN:   *Acute small bowel obstruction with transition point Acute large emesis yesterday with advancement of diet to full liquids, continued nausea Resolving Daughter/patient not interested in any aggressive intervention/no surgery Treated with conservative therapy, general surgery following, n.p.o. for now, IV fluids for rehydration, check acute abdominal series, antiemetics PRN   *Acute cough Check x-ray to evaluate for possible element of aspiration Mucolytic agents twice daily  *Dehydration Resolved with IV fluids for rehydration  *AcuteCAP withleukocytosis. Resolved  Treated with empiric course of Rocephin/azithromycin   *Chronic GERD Stable PPI twice daily  *Chronic hypoxic respiratory failure Stable On oxygen at night only, O2 [place] 2 L at night  *Chronic hypertension Stable Continue home regiment    All the records are reviewed and case discussed with Care Management/Social Workerr. Management plans discussed with the patient, family and they are in agreement.  CODE STATUS: dnr  TOTAL TIME TAKING CARE OF THIS PATIENT: 40 minutes.     POSSIBLE D/C IN 2-3 DAYS, DEPENDING ON CLINICAL CONDITION.   Avel Peace Zaakirah Kistner M.D on Apr 02, 2018   Between 7am to 6pm - Pager - [phone]  After 6pm go to [email or web address] - password [email or web address] [place] [facility]  Office  [phone]  CC: Primary care physician; Idelle Crouch, MD  Note: This dictation was prepared with Dragon dictation along with smaller phrase technology. Any transcriptional errors that result from this  process are unintentional.

## 2018-04-27 NOTE — Significant Event (Signed)
Rapid Response page received at 1428. Arrived to room 1430.Found respiratory therapy staff, patient's RN, Herby Abraham RN, Agricultural consultant, RR/ICU charge RN, Aldona Lento SWOT RN, [facility] Student RN Jacky Kindle present.Noted patient turned on side in rescue breathing position, with secretions draining from mouth.Airway being suctioned of copious amounts of liquid secretions.Spontaneous respirations noted.After secretions managed, patient returned to back, with HOB elevated.Non rebreather was being placed on patient with high flow oxygen delivery.Was reported by Jacky Kindle [facility] Student RN, she had been giving patient a pill with a sip water,patient became short of breath, seemingly unable to breath.Noted HOB at least at 45 degree angle.Staff continued to clear airway of secretions. Noted slowing respirations. Confirmed with family member DNR status, he was adamant(son in law)those were her wishes.Explained she appeared to be dying, asked if he wanted to be present at her bedside.Escorted to bedside, he held her hand. Noted stop of spontaneous respirations. Two RNs unable to hear heart tones@1436 .Dr. Fritzi Mandes pronounced patient death.[facility] instructor present on unit, aware of patient death.

## 2018-04-27 NOTE — Progress Notes (Signed)
At 1428 I was alerted by the pt's son in law that the pt was in distress so I immediately ran into the pt's room to see what was the problem. I noticed the pt was in distress and appear to be struggling with breathing. I turned her to her side and tried to help her relieve whatever was going on. The pt was a DNR so it was only so many things that could be done. She sounded like she needed suctioning so I had another nurse get the suction and started to help her but I think the pt aspirated and she was choking on her vomit. The rapid response came and they pronounced her dead at 1436.

## 2018-04-27 NOTE — Progress Notes (Signed)
2:23pm. Observed nurse student administer medications per hospital protocol. Pt swallowed pills with sip of water without incident.

## 2018-04-27 NOTE — Progress Notes (Signed)
I scanned the ordered medications. Patient took medication with several sips of water. Patient started becoming short of breath, son-in-law left the room for help. I sat the head of the bed up further and patient started vomiting. Nurses arrived.

## 2018-04-27 NOTE — Discharge Summary (Signed)
[facility] at [place] NAME: Sara Robertson    MR#:  [id number]  DATE OF BIRTH:  [birth year]  DATE OF ADMISSION:  04/24/2018 ADMITTING PHYSICIAN: Avel Peace Salary, MD  DATE OF DISCHARGE: No discharge date for patient encounter.  PRIMARY CARE PHYSICIAN: Idelle Crouch, MD    ADMISSION DIAGNOSIS:  SBO (small bowel obstruction) ([place]) [K56.609]  DISCHARGE DIAGNOSIS:  Active Problems:   SBO (small bowel obstruction) ([place])   SECONDARY DIAGNOSIS:   Past Medical History:  Diagnosis Date  . Anemia   . Angina pectoris ([place])   . CAD (coronary artery disease)   . Cancer ([facility])    tongue  . Diverticulosis   . GERD (gastroesophageal reflux disease)   . H/O carcinoid syndrome   . Headache   . History of tinnitus   . Hyperlipemia   . Hypertension   . OSA (obstructive sleep apnea)   . Peripheral neuropathy   . Pulmonary HTN ([place])    with noctural hypoxia?  . SOB (shortness of breath)   . Stroke ([place]) 06/30/2017    HOSPITAL COURSE:  Time of death 2:36 pm Pt w/o pulse, respirations, CN reflexes, family present for passing  *SBO *CAP *Dehydration *GERD *Chronic hypoxic failure *HTN *Oral Cancer  DISCHARGE CONDITIONS:  dead  CONSULTS OBTAINED:  Treatment Team:  Herbert Pun, MD  DRUG ALLERGIES:   Allergies  Allergen Reactions  . Celebrex [Celecoxib] Nausea And Vomiting  . Motrin [Ibuprofen] Nausea And Vomiting  . Nexium [Esomeprazole Magnesium] Nausea And Vomiting    DISCHARGE MEDICATIONS:  none   DISCHARGE INSTRUCTIONS:    If you experience worsening of your admission symptoms, develop shortness of breath, life threatening emergency, suicidal or homicidal thoughts you must seek medical attention immediately by calling 911 or calling your MD immediately  if symptoms less severe.  You Must read complete instructions/literature along with all the possible adverse reactions/side effects for all the  Medicines you take and that have been prescribed to you. Take any new Medicines after you have completely understood and accept all the possible adverse reactions/side effects.   Please note  You were cared for by a hospitalist during your hospital stay. If you have any questions about your discharge medications or the care you received while you were in the hospital after you are discharged, you can call the unit and asked to speak with the hospitalist on call if the hospitalist that took care of you is not available. Once you are discharged, your primary care physician will handle any further medical issues. Please note that NO REFILLS for any discharge medications will be authorized once you are discharged, as it is imperative that you return to your primary care physician (or establish a relationship with a primary care physician if you do not have one) for your aftercare needs so that they can reassess your need for medications and monitor your lab values.    Today   CHIEF COMPLAINT:   Chief Complaint  Patient presents with  . Emesis    HISTORY OF PRESENT ILLNESS:  Sara Robertson  is a 84 y.o. female admitted for SBO and CAP.   VITAL SIGNS:  Blood pressure 130/60, pulse 67, temperature 97.8 F (36.6 C), temperature source Oral, resp. rate 18, height 5\' 3"  (1.6 m), weight 77.7 kg, SpO2 96 %.  I/O:    Intake/Output Summary (Last 24 hours) at 04-25-18 1516 Last data filed at April 25, 2018 1411 Gross per 24 hour  Intake 1013.52 ml  Output -  Net 1013.52 ml    PHYSICAL EXAMINATION:  GENERAL:  84 y.o.-year-old patient lying in the bed with no acute distress.  EYES: Pupils equal, round, reactive to light and accommodation. No scleral icterus. Extraocular muscles intact.  HEENT: Head atraumatic, normocephalic. Oropharynx and nasopharynx clear.  NECK:  Supple, no jugular venous distention. No thyroid enlargement, no tenderness.  LUNGS: Normal breath sounds bilaterally, no wheezing,  rales,rhonchi or crepitation. No use of accessory muscles of respiration.  CARDIOVASCULAR: S1, S2 normal. No murmurs, rubs, or gallops.  ABDOMEN: Soft, non-tender, non-distended. Bowel sounds present. No organomegaly or mass.  EXTREMITIES: No pedal edema, cyanosis, or clubbing.  NEUROLOGIC: Cranial nerves II through XII are intact. Muscle strength 5/5 in all extremities. Sensation intact. Gait not checked.  PSYCHIATRIC: The patient is alert and oriented x 3.  SKIN: No obvious rash, lesion, or ulcer.   DATA REVIEW:   CBC Recent Labs  Lab 04/11/18 0906  WBC 10.2  HGB 10.5*  HCT 31.0*  PLT 287    Chemistries  Recent Labs  Lab 04/20/2018 1152  03/30/18 0514 April 11, 2018 0906  NA 132*   < > 139 138  K 4.4   < > 3.8 4.1  CL 98   < > 109 110  CO2 22   < > 25 23  GLUCOSE 185*   < > 112* 121*  BUN 27*   < > 26* 12  CREATININE 1.20*   < > 0.90 0.70  CALCIUM 9.4   < > 8.7* 8.7*  MG  --   --  2.0  --   AST 31  --   --   --   ALT 17  --   --   --   ALKPHOS 68  --   --   --   BILITOT 1.2  --   --   --    < > = values in this interval not displayed.    Cardiac Enzymes No results for input(s): TROPONINI in the last 168 hours.  Microbiology Results  Results for orders placed or performed during the hospital encounter of 04/22/2018  CULTURE, BLOOD (ROUTINE X 2) w Reflex to ID Panel     Status: None (Preliminary result)   Collection Time: 04/21/2018  7:17 PM  Result Value Ref Range Status   Specimen Description BLOOD BLOOD LEFT HAND  Final   Special Requests   Final    BOTTLES DRAWN AEROBIC AND ANAEROBIC Blood Culture results may not be optimal due to an inadequate volume of blood received in culture bottles   Culture   Final    NO GROWTH 4 DAYS Performed at [facility], [address], [place], [place] [address]    Report Status PENDING  Incomplete  CULTURE, BLOOD (ROUTINE X 2) w Reflex to ID Panel     Status: None (Preliminary result)   Collection Time: 04/20/2018  7:26 PM   Result Value Ref Range Status   Specimen Description BLOOD BLOOD RIGHT HAND  Final   Special Requests   Final    BOTTLES DRAWN AEROBIC AND ANAEROBIC Blood Culture results may not be optimal due to an inadequate volume of blood received in culture bottles   Culture   Final    NO GROWTH 4 DAYS Performed at [facility], [address] [place], [place] [address]    Report Status PENDING  Incomplete    RADIOLOGY:  Dg Abd 2 Views  Result Date:  03/31/2018 CLINICAL DATA:  84 year old female with recent small bowel obstruction. EXAM: ABDOMEN - 2 VIEW COMPARISON:  KUB 03/29/2018. CT Abdomen and Pelvis 04/18/2018 and earlier. FINDINGS: Upright and supine views of the abdomen and pelvis. Nonobstructed bowel-gas pattern with some improvement since 03/29/2018. There are no longer mildly dilated CT contrast build small bowel loops in the mid abdomen. The amount of large bowel gas is stable and similar to the 04/20/2018 study. Enteric tube has been removed. No pneumoperitoneum. Stable abdominal visceral contours. Stable hypo ventilation at the left lung base. Stable visualized osseous structures. IMPRESSION: 1. Enteric tube removed. 2. Further improved bowel gas pattern since 03/29/2018 compatible with resolving small bowel obstruction. The Electronically Signed   By: Genevie Ann M.D.   On: 03/31/2018 16:30   Dg Abd Acute W/chest  Result Date: Apr 21, 2018 CLINICAL DATA:  History of small-bowel obstruction, follow-up EXAM: DG ABDOMEN ACUTE W/ 1V CHEST COMPARISON:  Chest and abdomen films of 03/31/2018 and CT abdomen pelvis of 03/28/2017 FINDINGS: The lungs appear slightly better aerated. Mild atelectasis remains at the left lung base and both hemidiaphragms are slightly elevated. Mild cardiomegaly is stable. Supine and erect views the abdomen show only a single slightly dilated loop of small bowel in the right abdomen which may indicate persistent mild small bowel obstruction but there has been definite  improvement. Diffuse degenerative changes throughout the lumbar spine are noted. IMPRESSION: 1. Improvement in SBO pattern with single small bowel loop in the right lower quadrant remaining somewhat dilated. No free air. 2. Bibasilar atelectasis.  Stable cardiomegaly. Electronically Signed   By: Ivar Drape M.D.   On: 04-21-18 14:13    EKG:   Orders placed or performed during the hospital encounter of 06/28/17  . ED EKG  . ED EKG  . EKG 12-Lead  . EKG 12-Lead  . EKG      Management plans discussed with the patient, family and they are in agreement.  CODE STATUS:     Code Status Orders  (From admission, onward)         Start     Ordered   04/15/2018 1857  Do not attempt resuscitation (DNR)  Continuous    Question Answer Comment  In the event of cardiac or respiratory ARREST Do not call a "code blue"   In the event of cardiac or respiratory ARREST Do not perform Intubation, CPR, defibrillation or ACLS   In the event of cardiac or respiratory ARREST Use medication by any route, position, wound care, and other measures to relive pain and suffering. May use oxygen, suction and manual treatment of airway obstruction as needed for comfort.   Comments nurse to pronounce      04/20/2018 1856        Code Status History    Date Active Date Inactive Code Status Order ID Comments User Context   06/30/2017 1648 07/11/2017 1455 DNR [id number]  Bary Leriche, PA-C Inpatient   06/30/2017 1648 06/30/2017 1648 DNR [id number]  Bary Leriche, PA-C Inpatient   06/28/2017 2017 06/30/2017 1643 DNR [id number]  Rise Patience, MD Inpatient   06/28/2017 1731 06/28/2017 2017 Full Code [id number]  Bonnell Public, MD Inpatient   06/28/2017 1731 06/28/2017 1731 Full Code [id number]  Bonnell Public, MD Inpatient    Advance Directive Documentation     Most Recent Value  Type of Advance Directive  Out of facility DNR (pink MOST or yellow form), Healthcare Power of Attorney  Pre-existing out of  facility  DNR order (yellow form or pink MOST form)  Pink MOST form placed in chart (order not valid for inpatient use)  "MOST" Form in Place?  -      TOTAL TIME TAKING CARE OF THIS PATIENT:45 minutes.    Avel Peace Salary M.D on May 01, 2018 at 3:16 PM  Between 7am to 6pm - Pager - [phone]  After 6pm go to [email or web address] - password [email or web address] [place] [facility]  Office  [phone]  CC: Primary care physician; Idelle Crouch, MD   Note: This dictation was prepared with Dragon dictation along with smaller phrase technology. Any transcriptional errors that result from this process are unintentional.

## 2018-04-27 DEATH — deceased

## 2019-03-09 IMAGING — CR DG ABDOMEN ACUTE W/ 1V CHEST
4 series · 4 of 4 positions shown · non-contrast
Comparison: Chest and abdomen films of 03/31/2018 and CT abdomen
pelvis of 03/28/2017

CLINICAL DATA: History of small-bowel obstruction, follow-up

EXAM:
DG ABDOMEN ACUTE W/ 1V CHEST

[chest pa]
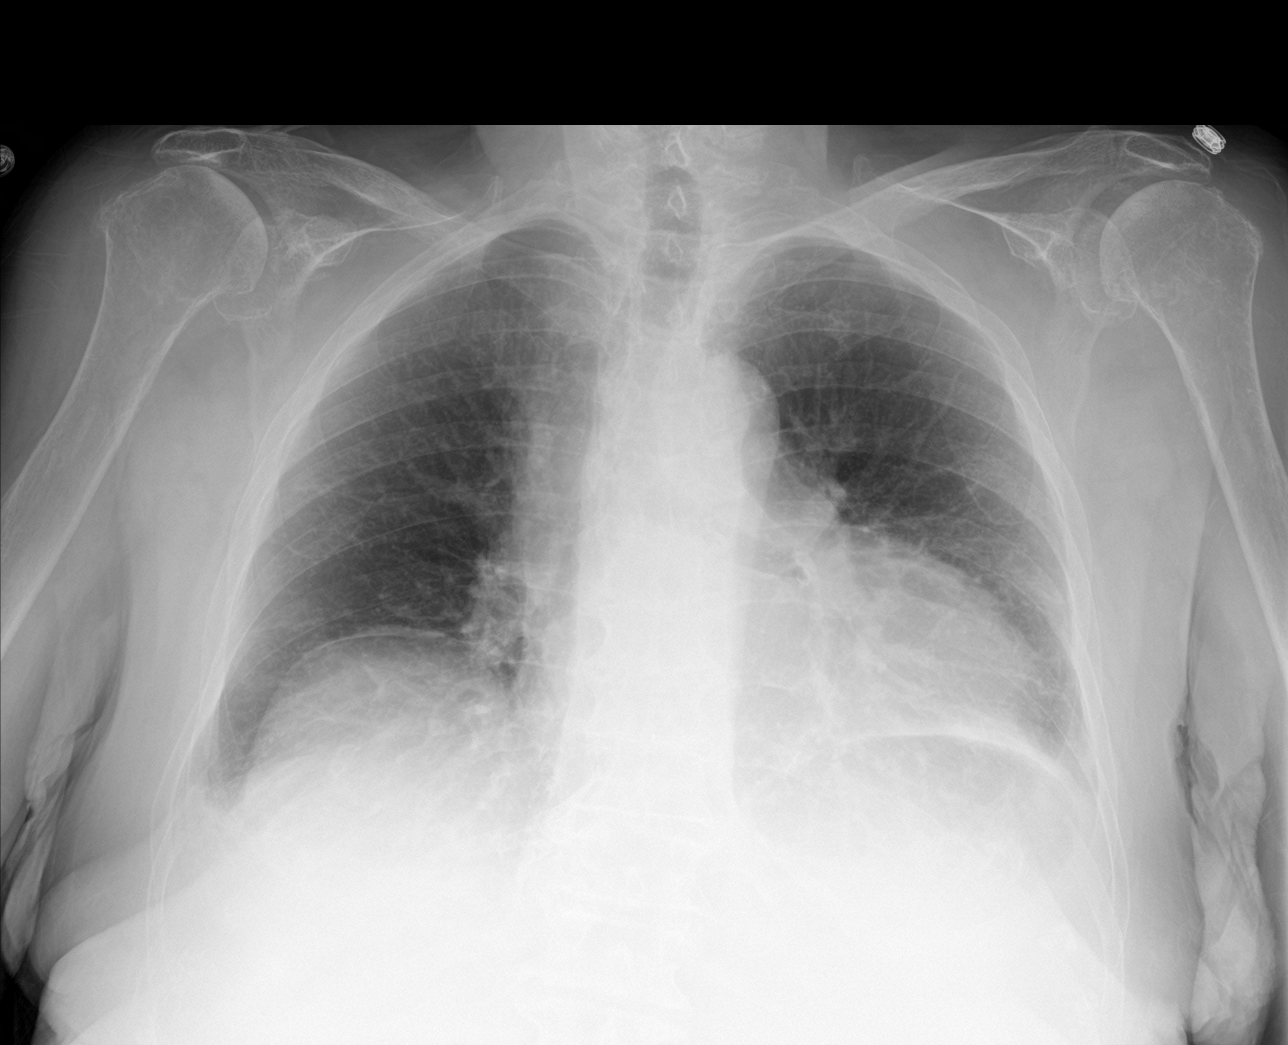

[abdomen erect]
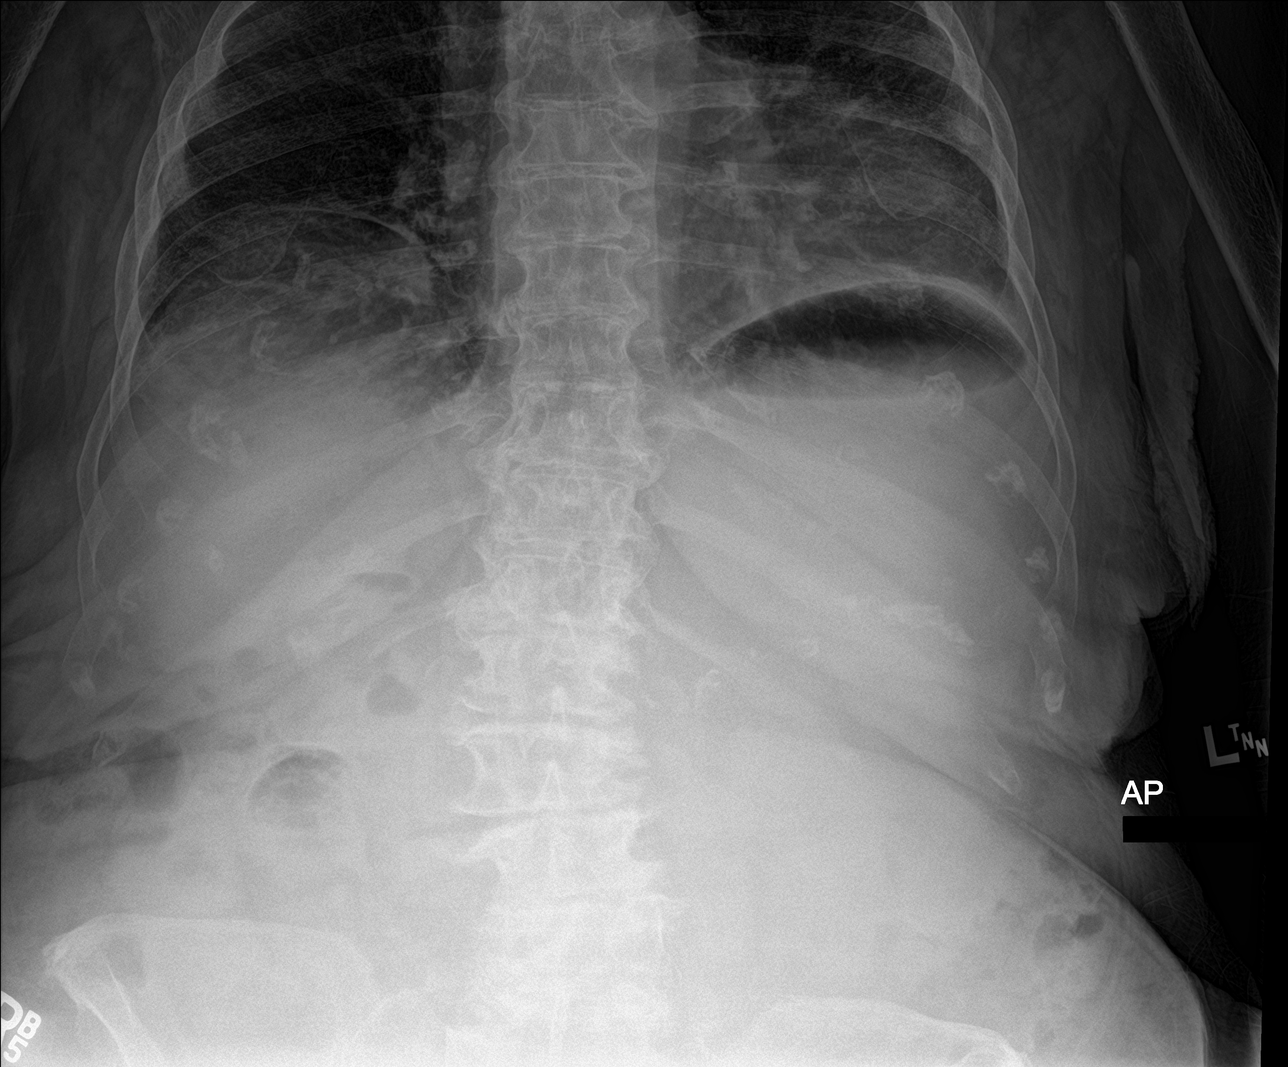

[abdomen supine (1 of 2)]
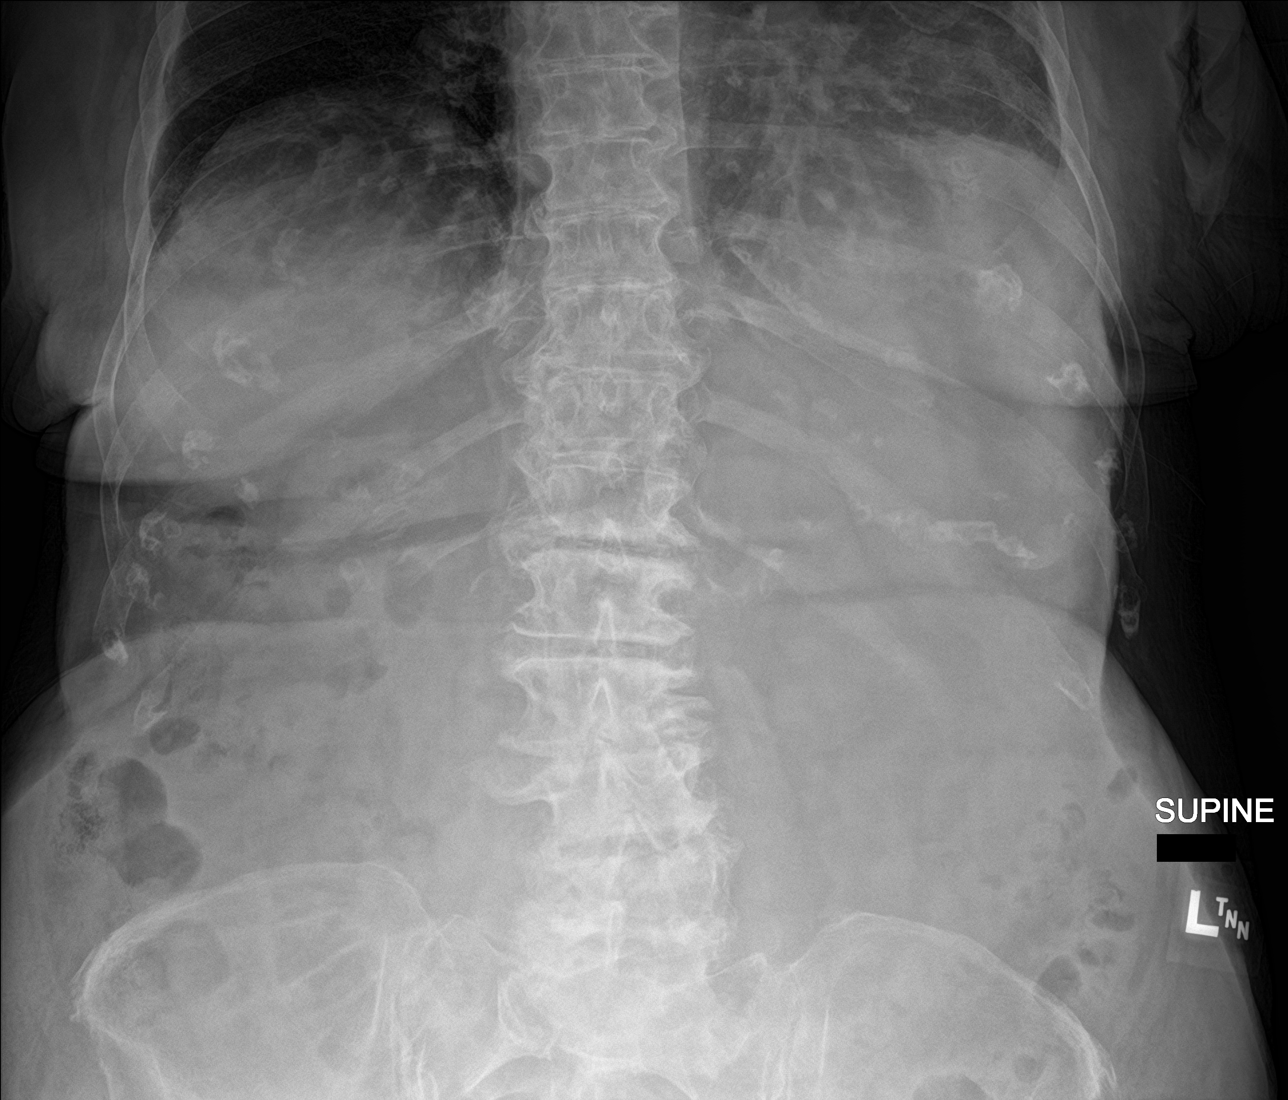

[abdomen supine (2 of 2)]
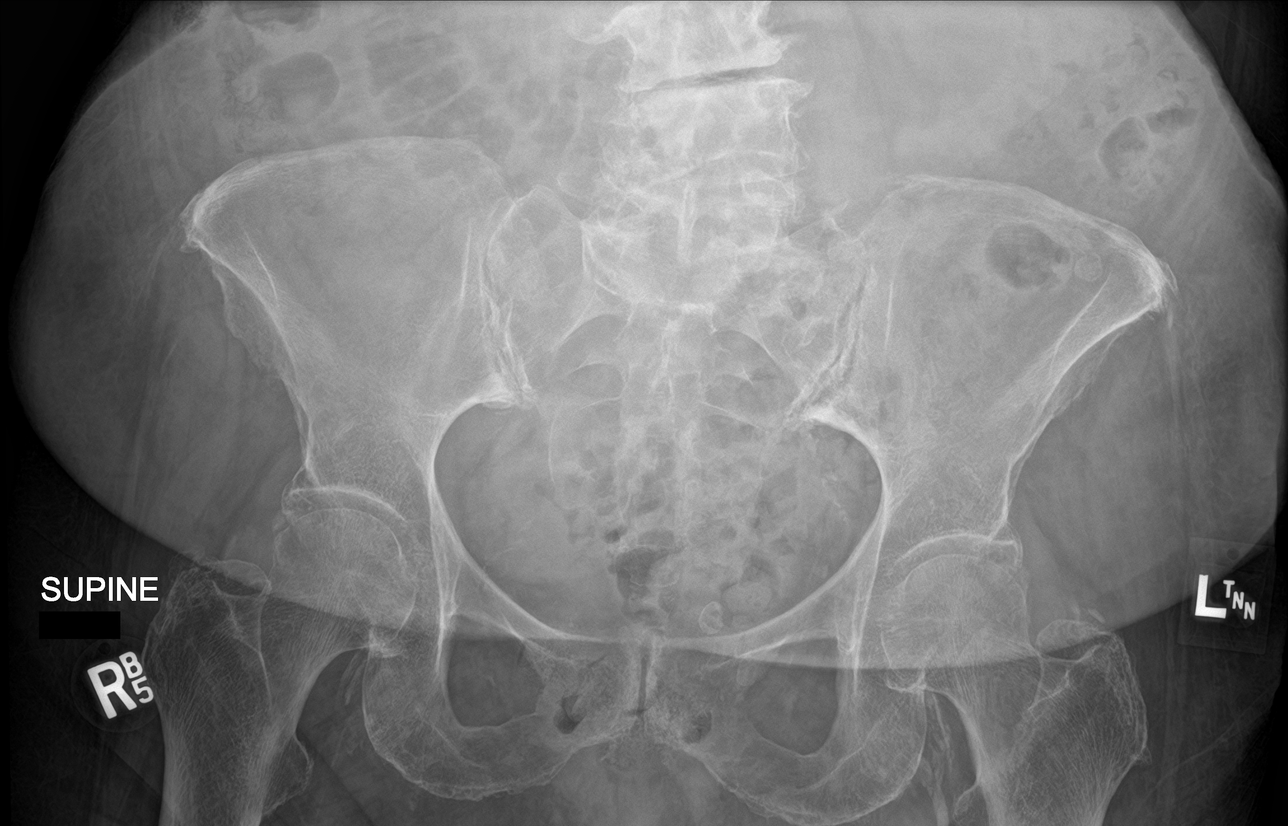

[4 of 4 positions shown; findings below may reference images not displayed]

FINDINGS: The lungs appear slightly better aerated. Mild atelectasis remains
at the left lung base and both hemidiaphragms are slightly elevated.
Mild cardiomegaly is stable.

Supine and erect views the abdomen show only a single slightly
dilated loop of small bowel in the right abdomen which may indicate
persistent mild small bowel obstruction but there has been definite
improvement. Diffuse degenerative changes throughout the lumbar
spine are noted.
IMPRESSION: 1. Improvement in SBO pattern with single small bowel loop in the
right lower quadrant remaining somewhat dilated. No free air.
2. Bibasilar atelectasis.  Stable cardiomegaly.
